# Patient Record
Sex: Female | Born: 1995 | Race: White | Hispanic: No | Marital: Married | Smoking: Former smoker
Health system: Southern US, Community
[De-identification: ages and names within clinical notes are randomized; demographics above are authoritative.]

## PROBLEM LIST (undated history)

## (undated) DIAGNOSIS — N200 Calculus of kidney: Secondary | ICD-10-CM

## (undated) DIAGNOSIS — E785 Hyperlipidemia, unspecified: Secondary | ICD-10-CM

## (undated) DIAGNOSIS — M25569 Pain in unspecified knee: Secondary | ICD-10-CM

## (undated) DIAGNOSIS — I10 Essential (primary) hypertension: Secondary | ICD-10-CM

## (undated) DIAGNOSIS — M549 Dorsalgia, unspecified: Secondary | ICD-10-CM

## (undated) DIAGNOSIS — M25473 Effusion, unspecified ankle: Secondary | ICD-10-CM

## (undated) DIAGNOSIS — Z87442 Personal history of urinary calculi: Secondary | ICD-10-CM

## (undated) DIAGNOSIS — K5900 Constipation, unspecified: Secondary | ICD-10-CM

## (undated) HISTORY — DX: Calculus of kidney: N20.0

## (undated) HISTORY — DX: Pain in unspecified knee: M25.569

## (undated) HISTORY — DX: Dorsalgia, unspecified: M54.9

## (undated) HISTORY — PX: FOOT SURGERY: SHX648

## (undated) HISTORY — DX: Constipation, unspecified: K59.00

## (undated) HISTORY — DX: Hyperlipidemia, unspecified: E78.5

## (undated) HISTORY — PX: CHOLECYSTECTOMY: SHX55

## (undated) HISTORY — DX: Effusion, unspecified ankle: M25.473

## (undated) HISTORY — PX: EYE MUSCLE SURGERY: SHX370

---

## 2020-06-17 ENCOUNTER — Other Ambulatory Visit: Payer: Self-pay

## 2020-06-17 ENCOUNTER — Emergency Department (HOSPITAL_COMMUNITY)
Admission: EM | Admit: 2020-06-17 | Discharge: 2020-06-17 | Disposition: A | Discharge status: Home / Self Care | Payer: Commercial Managed Care - HMO | Attending: Emergency Medicine | Admitting: Emergency Medicine

## 2020-06-17 ENCOUNTER — Emergency Department (HOSPITAL_COMMUNITY): Payer: Commercial Managed Care - HMO

## 2020-06-17 ENCOUNTER — Encounter (HOSPITAL_COMMUNITY): Payer: Self-pay | Admitting: Emergency Medicine

## 2020-06-17 DIAGNOSIS — F17210 Nicotine dependence, cigarettes, uncomplicated: Secondary | ICD-10-CM | POA: Insufficient documentation

## 2020-06-17 DIAGNOSIS — M25571 Pain in right ankle and joints of right foot: Secondary | ICD-10-CM | POA: Diagnosis present

## 2020-06-17 DIAGNOSIS — S82831A Other fracture of upper and lower end of right fibula, initial encounter for closed fracture: Secondary | ICD-10-CM | POA: Diagnosis not present

## 2020-06-17 DIAGNOSIS — W502XXA Accidental twist by another person, initial encounter: Secondary | ICD-10-CM | POA: Diagnosis not present

## 2020-06-17 MED ORDER — HYDROCODONE-ACETAMINOPHEN 5-325 MG PO TABS: 1.0000 | ORAL_TABLET | Freq: Four times a day (QID) | ORAL | 0 refills | Status: DC | PRN

## 2020-06-17 NOTE — ED Provider Notes (Signed)
[facility] EMERGENCY DEPARTMENT Provider Note   CSN: [id number] Arrival date & time: 06/17/20  5427     History Chief Complaint  Patient presents with  . Ankle Pain    Stefanie Farmer is a 25 y.o. female.  HPI Patient presents with right ankle injury. Happened last night while she was "play fighting" with her sister. States her foot twisted back under her. Planing of pain in the ankle particularly on the medial side. States it does go up the leg a little bit. No other injury. States her sister told her it could be broken because there is not a lot of bruising.    History reviewed. No pertinent past medical history.  There are no problems to display for this patient.   Past Surgical History:  Procedure Laterality Date  . FOOT SURGERY       OB History   No obstetric history on file.     No family history on file.  Social History   Tobacco Use  . Smoking status: Current Every Day Smoker  . Smokeless tobacco: Never Used  Vaping Use  . Vaping Use: Some days  Substance Use Topics  . Alcohol use: Yes  . Drug use: Yes    Types: Marijuana    Home Medications Prior to Admission medications   Medication Sig Start Date End Date Taking? Authorizing Provider  HYDROcodone-acetaminophen (NORCO/VICODIN) 5-325 MG tablet Take 1-2 tablets by mouth every 6 (six) hours as needed. 06/17/20  Yes Benjiman Core, MD    Allergies    Patient has no allergy information on record.  Review of Systems   Review of Systems  Constitutional: Negative for appetite change.  HENT: Negative for dental problem.   Cardiovascular: Negative for chest pain.  Gastrointestinal: Negative for abdominal pain.  Musculoskeletal: Negative for back pain and neck pain.       Right ankle pain and swelling.  Skin: Negative for wound.  Neurological: Negative for weakness and numbness.    Physical Exam Updated Vital Signs BP (!) 108/50   Pulse 66   Temp 98.3 F (36.8 C)   Resp 18   Ht 5\' 2"   (1.575 m)   Wt 115.7 kg   LMP 06/03/2020   SpO2 100%   BMI 46.64 kg/m   Physical Exam Vitals and nursing note reviewed.  HENT:     Head: Atraumatic.  Cardiovascular:     Rate and Rhythm: Regular rhythm.  Chest:     Chest wall: No tenderness.  Abdominal:     Tenderness: There is no abdominal tenderness.  Musculoskeletal:     Cervical back: Neck supple.     Comments: Some tenderness and swelling right ankle. Some also swelling of foot. No real bruising. Skin intact. No tenderness over proximal fibula.  Skin:    General: Skin is warm.     Capillary Refill: Capillary refill takes less than 2 seconds.  Neurological:     Mental Status: She is alert and oriented to person, place, and time.     ED Results / Procedures / Treatments   Labs (all labs ordered are listed, but only abnormal results are displayed) Labs Reviewed - No data to display  EKG None  Radiology DG Ankle Complete Right  Result Date: 06/17/2020 CLINICAL DATA:  Pain and swelling. EXAM: RIGHT ANKLE - COMPLETE 3+ VIEW COMPARISON:  No prior. FINDINGS: Prominent diffuse soft tissue swelling. Slightly displaced fracture of the distal right fibula noted. Medial malleolus is intact. Tiny fracture fragment along  the posterior malleolus cannot be excluded. Tiny bony density noted adjacent to the right lateral foot, this may represent a tiny secondary ossification center or tiny avulsion fracture fragment. Lateral subluxation of the talus noted. IMPRESSION: 1. Prominent diffuse soft tissue swelling. Slightly displaced fracture of the distal right fibula. Medial malleolus intact. Tiny fracture fragment along the posterior malleolus cannot be excluded. Tiny bony density noted adjacent to the right lateral foot, this may represent a tiny secondary ossification center or tiny avulsion fracture fragment. 2.  Lateral subluxation of the talus. Electronically Signed   By: Maisie Fus  Register   On: 06/17/2020 06:35     Procedures Procedures (including critical care time)  Medications Ordered in ED Medications - No data to display  ED Course  I have reviewed the triage vital signs and the nursing notes.  Pertinent labs & imaging results that were available during my care of the patient were reviewed by me and considered in my medical decision making (see chart for details).    MDM Rules/Calculators/A&P                          Patient with distal fibular fracture. Also widening of mortise. Will immobilize with posterior and stirrup's. Nonweightbearing. Follow-up with orthopedic surgery. No other apparent injury. Discharge home. I have reviewed the images myself. Final Clinical Impression(s) / ED Diagnoses Final diagnoses:  Closed fracture of distal end of right fibula, unspecified fracture morphology, initial encounter    Rx / DC Orders ED Discharge Orders         Ordered    HYDROcodone-acetaminophen (NORCO/VICODIN) 5-325 MG tablet  Every 6 hours PRN        06/17/20 0732           Benjiman Core, MD 06/17/20 [phone]

## 2020-06-17 NOTE — ED Triage Notes (Addendum)
Pt c/o right lower leg/ankle pain after horse playing with sister last night.

## 2020-06-19 ENCOUNTER — Ambulatory Visit: Payer: Commercial Managed Care - HMO | Admitting: Orthopedic Surgery

## 2020-06-19 ENCOUNTER — Other Ambulatory Visit: Payer: Self-pay

## 2020-06-19 ENCOUNTER — Encounter: Payer: Self-pay | Admitting: Orthopedic Surgery

## 2020-06-19 ENCOUNTER — Ambulatory Visit: Payer: Commercial Managed Care - HMO

## 2020-06-19 VITALS — BP 133/75 | HR 88 | Ht 62.0 in | Wt 255.0 lb

## 2020-06-19 DIAGNOSIS — S7001XA Contusion of right hip, initial encounter: Secondary | ICD-10-CM

## 2020-06-19 DIAGNOSIS — S8261XA Displaced fracture of lateral malleolus of right fibula, initial encounter for closed fracture: Secondary | ICD-10-CM | POA: Diagnosis not present

## 2020-06-19 DIAGNOSIS — M25551 Pain in right hip: Secondary | ICD-10-CM

## 2020-06-19 MED ORDER — HYDROCODONE-ACETAMINOPHEN 5-325 MG PO TABS: 1.0000 | ORAL_TABLET | Freq: Four times a day (QID) | ORAL | 0 refills | Status: DC | PRN

## 2020-06-19 MED ORDER — IBUPROFEN 800 MG PO TABS: 800.0000 mg | ORAL_TABLET | Freq: Three times a day (TID) | ORAL | 1 refills | Status: DC | PRN

## 2020-06-19 NOTE — Patient Instructions (Addendum)
Keep the foot elevated  Apply ice as needed to control swelling  If you feel throbbing pain take 800 mg of ibuprofen  Continue your hydrocodone one every 6 hours as needed   Displaced Fibular Ankle Fracture Treated With ORIF A fibular ankle fracture is a break (fracture) in the part of the fibula that is located near the ankle. The fibula is the smaller of the two bones in the lower leg and is on the outer side of the leg. A displaced fracture means that when your bone broke, the pieces shifted and are not lined up correctly. Your ankle joint can also become unstable, which means the bones move more than normal. The bones will be put back into position and held in place with a procedure called open reduction with internal fixation (ORIF). A combination of screws, screws and a metal plate, or different types of wiring will be used. The procedure helps the bones and the tissues that connect bones to each other (ligaments) heal properly. Tell a health care provider about:  Any allergies you have.  All medicines you are taking, including vitamins, herbs, eye drops, creams, and over-the-counter medicines.  Any problems you or family members have had with anesthetic medicines.  Any blood disorders you have.  Any surgeries you have had.  Any medical conditions you have, such as sleep apnea or heart disease.  Whether you are pregnant or may be pregnant. What are the risks? Generally, this is a safe procedure. However, problems may occur.  Problems that may occur shortly after surgery include: ? Bleeding or blood clots. ? Allergic reactions to medicines. ? Damage to nearby structures such as nerves or blood vessels. ? Infection.  Long-term (chronic) problems that may occur after surgery include: ? Chronic pain from the screws or plates. ? Arthritis. This is chronic joint pain or joint disease. ? Failure to heal properly. ? Chronic pain and loss of range of motion that make it difficult  for you to walk or do some activities. What happens before the procedure? Staying hydrated Follow instructions from your health care provider about hydration, which may include:  Up to 2 hours before the procedure - you may continue to drink clear liquids, such as water, clear fruit juice, black coffee, and plain tea.   Eating and drinking restrictions Follow instructions from your health care provider about eating and drinking, which may include:  8 hours before the procedure - stop eating heavy meals or foods, such as meat, fried foods, or fatty foods.  6 hours before the procedure - stop eating light meals or foods, such as toast or cereal.  6 hours before the procedure - stop drinking milk or drinks that contain milk.  2 hours before the procedure - stop drinking clear liquids. Medicines  Ask your health care provider about: ? Changing or stopping your regular medicines. This is especially important if you are taking diabetes medicines or blood thinners. ? Taking medicines such as aspirin and ibuprofen. These medicines can thin your blood. Do not take these medicines unless your health care provider tells you to take them. ? Taking over-the-counter medicines, vitamins, herbs, and supplements.  You may be given antibiotic medicine to help prevent infection. General instructions  Do not use any products that contain nicotine or tobacco for at least 4 weeks before the procedure. These products include cigarettes, e-cigarettes, and chewing tobacco. If you need help quitting, ask your health care provider.  Plan to have someone take you home  from the hospital or clinic.  Plan to have a responsible adult care for you for at least 24 hours after you leave the hospital or clinic. This is important.  Ask your health care provider: ? How your surgery site will be marked. ? What steps will be taken to help prevent infection. These steps may include:  Removing hair at the surgery  site.  Washing skin with a germ-killing soap.  Taking antibiotic medicine. What happens during the procedure?  An IV will be inserted into one of your veins.  You will be given one or more of the following: ? A medicine to help you relax (sedative). ? A medicine to numb the area (local anesthetic). ? A medicine to make you fall asleep (general anesthetic). ? A medicine that is injected into an area of your body to numb everything below the injection site (regional anesthetic).  An incision will be made on the outside of your ankle to expose the bone.  The broken bone will be returned to its normal position. To hold the bone in place, the surgeon will use hardware such as screws and a metal plate or different types of wiring.  The surgeon will close the incision with stitches (sutures) or staples.  A bandage (dressing) will be placed over your incision.  A padded splint will be placed to help protect your ankle and keep it still. The procedure may vary among health care providers and hospitals.   What happens after the procedure?  Your blood pressure, heart rate, breathing rate, and blood oxygen level will be monitored until you leave the hospital or clinic.  You will be given pain medicine as needed.  You may have an X-ray done to make sure the bones are in the right position.  You will be not be able to support your body weight with (not be able to put weight on) your injured side for several weeks. You will be given crutches, a walker, or a scooter.  If you were given a sedative during the procedure, it can affect you for several hours. Do not drive or operate machinery until your health care provider says that it is safe. Summary  A fibular ankle fracture is a break in the part of the bone called the fibula that is located near your ankle. Displaced means the pieces of bone shifted and are not lined up correctly.  Open reduction with internal fixation (ORIF) is the  procedure to put your bones back in position and hold them in place.  Follow instructions from your health care provider about eating, drinking, and taking medicines before your procedure. This information is not intended to replace advice given to you by your health care provider. Make sure you discuss any questions you have with your health care provider. Document Revised: 09/03/2019 Document Reviewed: 09/03/2019 Elsevier Patient Education  2021 [employer].

## 2020-06-19 NOTE — Progress Notes (Signed)
EMERGENCY ROOM FOLLOW UP  NEW PROBLEM/PATIENT   Patient ID: Stefanie Farmer, female   DOB: 1995/06/07, 25 y.o.   MRN: [id number]   ASSESSMENT AND PLAN:  25 year old female had a right ankle injury on January 17 play fighting with her sister. Twisted her ankle fell x-ray showed a lateral malleolus fracture with widening ankle mortise despite splinting she continues to complain of pain and due to the ankle mortise widening she will need ORIF of the right ankle  The procedure has been fully reviewed with the patient; The risks and benefits of surgery have been discussed and explained and understood. Alternative treatment has also been reviewed, questions were encouraged and answered. The postoperative plan is also been reviewed.   Emergency room record from (date) June 17, 2020 has been reviewed and this is included by reference and includes the review of systems with the following addition:   Chief Complaint  Patient presents with  . Ankle Injury    06/17/20 right ankle injury   . Hip Pain    Right    HPI Stefanie Farmer is a 25 y.o. female. Evaluation of the right ankle 25 year old female injured her right ankle complains of pain medial lateral side has swelling medially and laterally ankle mortise shows widening     Review of Systems Review of Systems  Neurological: Negative for numbness.  All other systems reviewed and are negative.    has no past medical history on file.   Past Surgical History:  Procedure Laterality Date  . FOOT SURGERY      History reviewed. No pertinent family history.  Social History Social History   Tobacco Use  . Smoking status: Current Every Day Smoker  . Smokeless tobacco: Never Used  Vaping Use  . Vaping Use: Some days  Substance Use Topics  . Alcohol use: Yes  . Drug use: Yes    Types: Marijuana    Not on File  Current Outpatient Medications  Medication Sig Dispense Refill  . HYDROcodone-acetaminophen (NORCO/VICODIN) 5-325 MG  tablet Take 1 tablet by mouth every 6 (six) hours as needed for moderate pain. 30 tablet 0  . ibuprofen (ADVIL) 800 MG tablet Take 1 tablet (800 mg total) by mouth every 8 (eight) hours as needed. 90 tablet 1   No current facility-administered medications for this visit.    Physical Exam BP 133/75   Pulse 88   Ht 5\' 2"  (1.575 m)   Wt 255 lb (115.7 kg)   LMP 06/03/2020   BMI 46.64 kg/m  Body mass index is 46.64 kg/m.  Well developed and well nourished  Unable to weight-bear alert and oriented x 3  Normal affect and mood   GAIT:  As above  Right hip tenderness normal range of motion no instability noted   (STRIMVS) Right ankle is tender laterally over the fibula medially over the medial collateral ligament where there is ecchymosis and swelling no gross deformity is seen color and capillary refill are normal motor function is intact  Imaging indicates unstable fracture pattern  Data Reviewed IMAGING From THE ER AND THE REPORT ARE REVIEWED, MY INTERPRETATION OF THE IMAGE(S) IS : Oblique lateral malleolus fracture Weber type B supination external rotation injury with widening of ankle mortise  X-ray was taken to evaluate the right hip pain  Normal AP lateral and pelvic view right hip   Assessment  Encounter Diagnoses  Name Primary?  . Acute right hip pain Yes  . Closed displaced fracture of lateral malleolus of right  fibula, initial encounter   . Contusion of right hip, initial encounter      Plan   ORIF right ankle lateral plate   Stefanie Canada, MD 06/19/2020 9:55 AM

## 2020-06-20 NOTE — Patient Instructions (Signed)
Rolly SalterHaley Knab  06/20/2020     @PREFPERIOPPHARMACY @   Your procedure is scheduled on  06/24/2020 .  [facility] to [facility] at  0915  A.M.   Call this number if you have problems the morning of surgery:  [phone]   Remember:  Do not eat or drink after midnight.                         Take these medicines the morning of surgery with A SIP OF WATER  Hydrocodone(if needed).   Please brush your teeth.  Do not wear jewelry, make-up or nail polish.  Do not wear lotions, powders, or perfumes, or deodorant.  Do not shave 48 hours prior to surgery.  Men may shave face and neck.  Do not bring valuables to the hospital.  [facility] is not responsible for any belongings or valuables.  Contacts, dentures or bridgework may not be worn into surgery.  Leave your suitcase in the car.  After surgery it may be brought to your room.  For patients admitted to the hospital, discharge time will be determined by your treatment team.  Patients discharged the day of surgery will not be allowed to drive home and they will need someone with them for 24 hours.   Special instructions:  DO NOT smoke (tobacco or vape) the morning of your procedure.   Use the CHG wipes to clean your skin the night before and the morning of your procedure. DO NOT use the CHG on your face, hair or genitals(privatre parts) or on any open wounds or sores. There are 2 cloths in each package of chg. Use 1 wipe to clean the exposed skin on your right leg. DO NOT remove any bandages that are there. Use the other wipe to clean your arms, neck, torso and the left leg.     The morning of your surgery, repeat the above process before you come to the hospital with the second pack of wipes.   After you clean with the cloths, your skin may feel sticky, let your skin air dry before dressing. Make sure you out on clean clothes to sleep in and clean, comfortable clothes before you come to the hospital.     Place clean sheets  on your bed before you sleep the night before your surgery.   Please read over the following fact sheets that you were given. Anesthesia Post-op Instructions and Care and Recovery After Surgery       Displaced Bimalleolar Ankle Fracture Treated With ORIF, Care After This sheet gives you information about how to care for yourself after your procedure. Your health care provider may also give you more specific instructions. If you have problems or questions, contact your health care provider. What can I expect after the procedure? After the procedure, it is common to have:  Pain.  Swelling.  A small amount of fluid from your incision. Follow these instructions at home: Medicines  Take over-the-counter and prescription medicines only as told by your health care provider.  Ask your health care provider if the medicine prescribed to you: ? Requires you to avoid driving or using machinery. ? Can cause constipation. You may need to take these actions to prevent or treat constipation:  Drink enough fluid to keep your urine pale yellow.  Take over-the-counter or prescription medicines.  Eat foods that are high in fiber, such as beans, whole grains, and fresh fruits and  vegetables.  Limit foods that are high in fat and processed sugars, such as fried or sweet foods. If you have a splint or boot:  Wear the splint or boot as told by your health care provider. Remove it only as told by your health care provider.  Loosen the splint or boot if your toes tingle, become numb, or turn cold and blue.  Keep the splint or boot clean. If you have a cast:  Do not stick anything inside the cast to scratch your skin. Doing that increases your risk of infection.  Check the skin around the cast every day. Tell your health care provider about any concerns.  You may put lotion on dry skin around the edges of the cast. Do not put lotion on the skin underneath the cast.  Keep the cast  clean. Bathing  Do not take baths, swim, or use a hot tub until your health care provider approves. Ask your health care provider if you may take showers. You may only be allowed to take sponge baths.  If your splint, boot, or cast is not waterproof: ? Do not let it get wet. ? Cover it with a watertight covering when you take a bath or a shower.  Keep the bandage (dressing) dry until your health care provider says it can be removed. Incision care  Follow instructions from your health care provider about how to take care of your incision. Make sure you: ? Wash your hands with soap and water for at least 20 seconds before and after you change your dressing. If soap and water are not available, use hand sanitizer. ? Change your dressing as told by your health care provider. ? Leave stitches (sutures), skin glue, or adhesive strips in place. These skin closures may need to stay in place for 2 weeks or longer. If adhesive strip edges start to loosen and curl up, you may trim the loose edges. Do not remove adhesive strips completely unless your health care provider tells you to do that.  Check your incision area every day for signs of infection. Check for: ? Redness. ? More pain or more swelling. ? Blood or more fluid. ? Warmth. ? Pus or a bad smell.   Managing pain, stiffness, and swelling  If directed, put ice on the affected area. To do this: ? If you have a removable splint or boot, remove it as told by your health care provider. ? Put ice in a plastic bag. ? Place a towel between your skin and the bag, or between your cast and the bag. ? Leave the ice on for 20 minutes, 2-3 times a day. ? Remove the ice if your skin turns bright red. This is very important. If you cannot feel pain, heat, or cold, you have a greater risk of damage to the area.  Move your toes often to reduce stiffness and swelling.  Raise (elevate) the injured area above the level of your heart while you are sitting  or lying down. To do this, try putting a few pillows under your leg and ankle.   Activity  Do not use your injured limb to support (bear) your body weight until your health care provider says that you can. Follow weight-bearing restrictions as told. Use crutches or other assistive devicesto help you move around as told by your health care provider.  Ask your health care provider when it is safe to drive if you have a splint, boot, or cast on your foot.  Do exercises as told by your health care provider or physical therapist.  Return to your normal activities as told by your health care provider. Ask your health care provider what activities are safe for you. General instructions  Do not put pressure on any part of the splint or cast until it is fully hardened, if applicable. This may take several hours.  Do not use any products that contain nicotine or tobacco, such as cigarettes, e-cigarettes, and chewing tobacco. These can delay bone healing. If you need help quitting, ask your health care provider.  Keep all follow-up visits. This is important. Contact a health care provider if:  You have a fever.  Your pain medicine is not helping.  You have redness around your incision.  You have more swelling or severe pain around your incision.  You have more fluid or blood coming from your incision or leaking through your cast.  Your incision feels warm to the touch.  You have pus or a bad smell coming from your incision or from your cast or dressing. Get help right away if:  The edges of your incision come apart after the stitches or staples have been taken out.  You have chest pain.  You have trouble breathing.  Your foot or leg feels numb or tingles.  Your foot becomes cold, pale, or blue.  You have calf swelling or tenderness. These symptoms may represent a serious problem that is an emergency. Do not wait to see if the symptoms will go away. Get medical help right away. Call  your local emergency services (911 in the [place] Do not drive yourself to the hospital. Summary  After the procedure, it is common to have pain and swelling.  If your splint, boot, or cast is not waterproof, do not let it get wet.  Contact your health care provider if you have more swelling or severe pain, or if you have more fluids coming from your incision or leaking through your cast.  Get help right away if you have numbness or tingling in your foot or leg, or if your foot becomes cold, pale, or blue. This information is not intended to replace advice given to you by your health care provider. Make sure you discuss any questions you have with your health care provider. Document Revised: 08/20/2019 Document Reviewed: 08/20/2019 Elsevier Patient Education  2021 [employer] General Anesthesia, Adult, Care After This sheet gives you information about how to care for yourself after your procedure. Your health care provider may also give you more specific instructions. If you have problems or questions, contact your health care provider. What can I expect after the procedure? After the procedure, the following side effects are common:  Pain or discomfort at the IV site.  Nausea.  Vomiting.  Sore throat.  Trouble concentrating.  Feeling cold or chills.  Feeling weak or tired.  Sleepiness and fatigue.  Soreness and body aches. These side effects can affect parts of the body that were not involved in surgery. Follow these instructions at home: For the time period you were told by your health care provider:  Rest.  Do not participate in activities where you could fall or become injured.  Do not drive or use machinery.  Do not drink alcohol.  Do not take sleeping pills or medicines that cause drowsiness.  Do not make important decisions or sign legal documents.  Do not take care of children on your own.   Eating and drinking  Follow any instructions  from your health  care provider about eating or drinking restrictions.  When you feel hungry, start by eating small amounts of foods that are soft and easy to digest (bland), such as toast. Gradually return to your regular diet.  Drink enough fluid to keep your urine pale yellow.  If you vomit, rehydrate by drinking water, juice, or clear broth. General instructions  If you have sleep apnea, surgery and certain medicines can increase your risk for breathing problems. Follow instructions from your health care provider about wearing your sleep device: ? Anytime you are sleeping, including during daytime naps. ? While taking prescription pain medicines, sleeping medicines, or medicines that make you drowsy.  Have a responsible adult stay with you for the time you are told. It is important to have someone help care for you until you are awake and alert.  Return to your normal activities as told by your health care provider. Ask your health care provider what activities are safe for you.  Take over-the-counter and prescription medicines only as told by your health care provider.  If you smoke, do not smoke without supervision.  Keep all follow-up visits as told by your health care provider. This is important. Contact a health care provider if:  You have nausea or vomiting that does not get better with medicine.  You cannot eat or drink without vomiting.  You have pain that does not get better with medicine.  You are unable to pass urine.  You develop a skin rash.  You have a fever.  You have redness around your IV site that gets worse. Get help right away if:  You have difficulty breathing.  You have chest pain.  You have blood in your urine or stool, or you vomit blood. Summary  After the procedure, it is common to have a sore throat or nausea. It is also common to feel tired.  Have a responsible adult stay with you for the time you are told. It is important to have someone help care for  you until you are awake and alert.  When you feel hungry, start by eating small amounts of foods that are soft and easy to digest (bland), such as toast. Gradually return to your regular diet.  Drink enough fluid to keep your urine pale yellow.  Return to your normal activities as told by your health care provider. Ask your health care provider what activities are safe for you. This information is not intended to replace advice given to you by your health care provider. Make sure you discuss any questions you have with your health care provider. Document Revised: 01/31/2020 Document Reviewed: 08/30/2019 Elsevier Patient Education  2021 [employer].

## 2020-06-23 ENCOUNTER — Other Ambulatory Visit (HOSPITAL_COMMUNITY)
Admission: RE | Admit: 2020-06-23 | Discharge: 2020-06-23 | Disposition: A | Discharge status: Home / Self Care | Payer: Commercial Managed Care - HMO | Source: Ambulatory Visit | Attending: Orthopedic Surgery | Admitting: Orthopedic Surgery

## 2020-06-23 ENCOUNTER — Telehealth: Payer: Self-pay | Admitting: Orthopedic Surgery

## 2020-06-23 ENCOUNTER — Other Ambulatory Visit: Payer: Self-pay

## 2020-06-23 ENCOUNTER — Encounter (HOSPITAL_COMMUNITY)
Admission: RE | Admit: 2020-06-23 | Discharge: 2020-06-23 | Disposition: A | Discharge status: Home / Self Care | Payer: Commercial Managed Care - HMO | Source: Ambulatory Visit | Attending: Orthopedic Surgery | Admitting: Orthopedic Surgery

## 2020-06-23 ENCOUNTER — Encounter (HOSPITAL_COMMUNITY): Payer: Self-pay

## 2020-06-23 DIAGNOSIS — Z01812 Encounter for preprocedural laboratory examination: Secondary | ICD-10-CM | POA: Insufficient documentation

## 2020-06-23 DIAGNOSIS — Z20822 Contact with and (suspected) exposure to covid-19: Secondary | ICD-10-CM | POA: Insufficient documentation

## 2020-06-23 LAB — BASIC METABOLIC PANEL
Anion gap: 7 (ref 5–15)
BUN: 11 mg/dL (ref 6–20)
CO2: 27 mmol/L (ref 22–32)
Calcium: 8.9 mg/dL (ref 8.9–10.3)
Chloride: 103 mmol/L (ref 98–111)
Creatinine, Ser: 0.66 mg/dL (ref 0.44–1.00)
GFR, Estimated: >60 mL/min (ref >60)
Glucose, Bld: 88 mg/dL (ref 70–99)
Potassium: 3.7 mmol/L (ref 3.5–5.1)
Sodium: 137 mmol/L (ref 135–145)

## 2020-06-23 LAB — CBC WITH DIFFERENTIAL/PLATELET
Abs Immature Granulocytes: 0.03 10*3/uL (ref 0.00–0.07)
Basophils Absolute: 0 10*3/uL (ref 0.0–0.1)
Basophils Relative: 0 %
Eosinophils Absolute: 0.3 10*3/uL (ref 0.0–0.5)
Eosinophils Relative: 3 %
HCT: 41.3 % (ref 36.0–46.0)
Hemoglobin: 13.1 g/dL (ref 12.0–15.0)
Immature Granulocytes: 0 %
Lymphocytes Relative: 29 %
Lymphs Abs: 2.7 10*3/uL (ref 0.7–4.0)
MCH: 29.6 pg (ref 26.0–34.0)
MCHC: 31.7 g/dL (ref 30.0–36.0)
MCV: 93.2 fL (ref 80.0–100.0)
Monocytes Absolute: 0.6 10*3/uL (ref 0.1–1.0)
Monocytes Relative: 7 %
Neutro Abs: 5.4 10*3/uL (ref 1.7–7.7)
Neutrophils Relative %: 61 %
Platelets: 374 10*3/uL (ref 150–400)
RBC: 4.43 MIL/uL (ref 3.87–5.11)
RDW: 13.6 % (ref 11.5–15.5)
WBC: 9.1 10*3/uL (ref 4.0–10.5)
nRBC: 0 % (ref 0.0–0.2)

## 2020-06-23 LAB — HCG, SERUM, QUALITATIVE: Preg, Serum: NEGATIVE

## 2020-06-23 NOTE — Telephone Encounter (Signed)
This patient called and had a fall yesterday on the other hip and is having some more swelling in the ankle that is supposed to be having surgery on 06/24/2020. She was outside walking and lost her balance. She did not complain of any other pain in other areas.  Do you need to evaluate her before the surgery? She still has the splint that was placed on her in the clinic at the last visit last week. She feels like a throbbing pain. Please advise.

## 2020-06-23 NOTE — Telephone Encounter (Signed)
I called patient and she voices understanding. No other concerns.

## 2020-06-23 NOTE — Telephone Encounter (Signed)
No   We l see her tomorrow

## 2020-06-23 NOTE — H&P (Signed)
Outpatient history and physical for right ankle surgery    Patient ID: Stefanie Farmer, female   DOB: [birth year], 25 y.o.   MRN: [id number]     ASSESSMENT AND PLAN:  25 year old female had a right ankle injury on January 17 play fighting with her sister. Twisted her ankle fell x-ray showed a lateral malleolus fracture with widening ankle mortise despite splinting she continues to complain of pain and due to the ankle mortise widening she will need ORIF of the right ankle   The procedure has been fully reviewed with the patient; The risks and benefits of surgery have been discussed and explained and understood. Alternative treatment has also been reviewed, questions were encouraged and answered. The postoperative plan is also been reviewed.     Emergency room record from (date) June 17, 2020 has been reviewed and this is included by reference and includes the review of systems with the following addition:        Chief Complaint  Patient presents with  . Ankle Injury      06/17/20 right ankle injury   . Hip Pain      Right      HPI Stefanie Farmer is a 25 y.o. female. Evaluation of the right ankle 25 year old female injured her right ankle complains of pain medial lateral side has swelling medially and laterally ankle mortise shows widening         Review of Systems Review of Systems  Neurological: Negative for numbness.  All other systems reviewed and are negative.      has no past medical history on file.         Past Surgical History:  Procedure Laterality Date  . FOOT SURGERY          History reviewed. No pertinent family history.   Social History Social History         Tobacco Use  . Smoking status: Current Every Day Smoker  . Smokeless tobacco: Never Used  Vaping Use  . Vaping Use: Some days  Substance Use Topics  . Alcohol use: Yes  . Drug use: Yes      Types: Marijuana      Not on File         Current Outpatient Medications  Medication Sig  Dispense Refill  . HYDROcodone-acetaminophen (NORCO/VICODIN) 5-325 MG tablet Take 1 tablet by mouth every 6 (six) hours as needed for moderate pain. 30 tablet 0  . ibuprofen (ADVIL) 800 MG tablet Take 1 tablet (800 mg total) by mouth every 8 (eight) hours as needed. 90 tablet 1    No current facility-administered medications for this visit.      Physical Exam BP 133/75   Pulse 88   Ht 5\' 2"  (1.575 m)   Wt 255 lb (115.7 kg)   LMP 06/03/2020   BMI 46.64 kg/m  Body mass index is 46.64 kg/m.   Well developed and well nourished  Unable to weight-bear alert and oriented x 3  Normal affect and mood     GAIT:   As above   Right hip tenderness normal range of motion no instability noted     (STRIMVS) Right ankle is tender laterally over the fibula medially over the medial collateral ligament where there is ecchymosis and swelling no gross deformity is seen color and capillary refill are normal motor function is intact   Imaging indicates unstable fracture pattern   Data Reviewed IMAGING From THE ER AND THE REPORT ARE REVIEWED, MY INTERPRETATION OF THE  IMAGE(S) IS : Oblique lateral malleolus fracture Weber type B supination external rotation injury with widening of ankle mortise   X-ray was taken to evaluate the right hip pain   Normal AP lateral and pelvic view right hip     Assessment       Encounter Diagnoses  Name Primary?  . Acute right hip pain Yes  . Closed displaced fracture of lateral malleolus of right fibula, initial encounter    . Contusion of right hip, initial encounter          Plan     ORIF right ankle lateral plate

## 2020-06-24 ENCOUNTER — Ambulatory Visit (HOSPITAL_COMMUNITY): Payer: Commercial Managed Care - HMO

## 2020-06-24 ENCOUNTER — Ambulatory Visit (HOSPITAL_COMMUNITY): Payer: Commercial Managed Care - HMO | Admitting: Certified Registered"

## 2020-06-24 ENCOUNTER — Encounter (HOSPITAL_COMMUNITY)
Admission: RE | Disposition: A | Discharge status: Home / Self Care | Payer: Self-pay | Source: Home / Self Care | Attending: Orthopedic Surgery

## 2020-06-24 ENCOUNTER — Other Ambulatory Visit: Payer: Self-pay

## 2020-06-24 ENCOUNTER — Encounter (HOSPITAL_COMMUNITY): Payer: Self-pay | Admitting: Orthopedic Surgery

## 2020-06-24 ENCOUNTER — Ambulatory Visit (HOSPITAL_COMMUNITY)
Admission: RE | Admit: 2020-06-24 | Discharge: 2020-06-24 | Disposition: A | Discharge status: Home / Self Care | Payer: Commercial Managed Care - HMO | Attending: Orthopedic Surgery | Admitting: Orthopedic Surgery

## 2020-06-24 DIAGNOSIS — X501XXA Overexertion from prolonged static or awkward postures, initial encounter: Secondary | ICD-10-CM | POA: Diagnosis not present

## 2020-06-24 DIAGNOSIS — F17200 Nicotine dependence, unspecified, uncomplicated: Secondary | ICD-10-CM | POA: Insufficient documentation

## 2020-06-24 DIAGNOSIS — S8263XD Displaced fracture of lateral malleolus of unspecified fibula, subsequent encounter for closed fracture with routine healing: Secondary | ICD-10-CM

## 2020-06-24 DIAGNOSIS — S82891A Other fracture of right lower leg, initial encounter for closed fracture: Secondary | ICD-10-CM

## 2020-06-24 DIAGNOSIS — S8261XA Displaced fracture of lateral malleolus of right fibula, initial encounter for closed fracture: Secondary | ICD-10-CM

## 2020-06-24 HISTORY — PX: ORIF ANKLE FRACTURE: SHX5408

## 2020-06-24 LAB — SARS CORONAVIRUS 2 (TAT 6-24 HRS): SARS Coronavirus 2: NEGATIVE

## 2020-06-24 SURGERY — OPEN REDUCTION INTERNAL FIXATION (ORIF) ANKLE FRACTURE
Anesthesia: Regional | Site: Ankle | Laterality: Right

## 2020-06-24 MED ORDER — HYDROMORPHONE HCL 1 MG/ML IJ SOLN
INTRAMUSCULAR | Status: AC
  Filled 2020-06-24: qty 0.5

## 2020-06-24 MED ORDER — HYDROCODONE-ACETAMINOPHEN 5-325 MG PO TABS
1.0000 | ORAL_TABLET | Freq: Once | ORAL | Status: AC
Start: 2020-06-24 — End: 2020-06-24
  Administered 2020-06-24: 1 via ORAL
  Filled 2020-06-24: qty 1

## 2020-06-24 MED ORDER — ONDANSETRON HCL 4 MG/2ML IJ SOLN
INTRAMUSCULAR | Status: DC | PRN
  Administered 2020-06-24: 4 mg via INTRAVENOUS

## 2020-06-24 MED ORDER — GLYCOPYRROLATE PF 0.2 MG/ML IJ SOSY
PREFILLED_SYRINGE | INTRAMUSCULAR | Status: AC
  Filled 2020-06-24: qty 1

## 2020-06-24 MED ORDER — PROPOFOL 10 MG/ML IV BOLUS
INTRAVENOUS | Status: AC
  Filled 2020-06-24: qty 40

## 2020-06-24 MED ORDER — FENTANYL CITRATE (PF) 100 MCG/2ML IJ SOLN
INTRAMUSCULAR | Status: AC
  Filled 2020-06-24: qty 2

## 2020-06-24 MED ORDER — IBUPROFEN 800 MG PO TABS: 800.0000 mg | ORAL_TABLET | Freq: Once | ORAL | Status: DC

## 2020-06-24 MED ORDER — LIDOCAINE HCL (PF) 2 % IJ SOLN
INTRAMUSCULAR | Status: AC
  Filled 2020-06-24: qty 5

## 2020-06-24 MED ORDER — ONDANSETRON HCL 4 MG/2ML IJ SOLN
INTRAMUSCULAR | Status: AC
  Filled 2020-06-24: qty 2

## 2020-06-24 MED ORDER — KETAMINE HCL 10 MG/ML IJ SOLN
INTRAMUSCULAR | Status: DC | PRN
  Administered 2020-06-24: 20 mg via INTRAVENOUS

## 2020-06-24 MED ORDER — FENTANYL CITRATE (PF) 100 MCG/2ML IJ SOLN
25.0000 ug | INTRAMUSCULAR | Status: DC | PRN
  Administered 2020-06-24 (×2): 50 ug via INTRAVENOUS
  Filled 2020-06-24: qty 2

## 2020-06-24 MED ORDER — GLYCOPYRROLATE PF 0.2 MG/ML IJ SOSY
PREFILLED_SYRINGE | INTRAMUSCULAR | Status: DC | PRN
  Administered 2020-06-24: .1 mg via INTRAVENOUS

## 2020-06-24 MED ORDER — ROPIVACAINE HCL 5 MG/ML IJ SOLN
INTRAMUSCULAR | Status: DC | PRN
  Administered 2020-06-24 (×2): 15 mL via EPIDURAL

## 2020-06-24 MED ORDER — 0.9 % SODIUM CHLORIDE (POUR BTL) OPTIME
TOPICAL | Status: DC | PRN
  Administered 2020-06-24: 1000 mL

## 2020-06-24 MED ORDER — LACTATED RINGERS IV SOLN: INTRAVENOUS | Status: DC

## 2020-06-24 MED ORDER — LIDOCAINE HCL 1 % IJ SOLN
INTRAMUSCULAR | Status: DC | PRN
  Administered 2020-06-24: 5 mL via INTRADERMAL
  Administered 2020-06-24: 1 mL via INTRADERMAL
  Administered 2020-06-24: 5 mL via INTRADERMAL
  Administered 2020-06-24: 1 mL via INTRADERMAL

## 2020-06-24 MED ORDER — DEXMEDETOMIDINE (PRECEDEX) IN NS 20 MCG/5ML (4 MCG/ML) IV SYRINGE
PREFILLED_SYRINGE | INTRAVENOUS | Status: AC
  Filled 2020-06-24: qty 5

## 2020-06-24 MED ORDER — MIDAZOLAM HCL 2 MG/2ML IJ SOLN
0.5000 mg | Freq: Once | INTRAMUSCULAR | Status: AC | PRN
  Administered 2020-06-24: 2 mg via INTRAVENOUS
  Filled 2020-06-24: qty 2

## 2020-06-24 MED ORDER — ORAL CARE MOUTH RINSE: 15.0000 mL | Freq: Once | OROMUCOSAL | Status: DC

## 2020-06-24 MED ORDER — FENTANYL CITRATE (PF) 100 MCG/2ML IJ SOLN
INTRAMUSCULAR | Status: DC | PRN
  Administered 2020-06-24 (×5): 50 ug via INTRAVENOUS

## 2020-06-24 MED ORDER — DEXAMETHASONE SODIUM PHOSPHATE 4 MG/ML IJ SOLN
INTRAMUSCULAR | Status: DC | PRN
  Administered 2020-06-24: 4 mg via INTRAVENOUS

## 2020-06-24 MED ORDER — DEXMEDETOMIDINE (PRECEDEX) IN NS 20 MCG/5ML (4 MCG/ML) IV SYRINGE
PREFILLED_SYRINGE | INTRAVENOUS | Status: DC | PRN
  Administered 2020-06-24: 20 ug via INTRAVENOUS

## 2020-06-24 MED ORDER — HYDROCODONE-ACETAMINOPHEN 7.5-325 MG PO TABS: 1.0000 | ORAL_TABLET | ORAL | 0 refills | Status: DC | PRN

## 2020-06-24 MED ORDER — MIDAZOLAM HCL 5 MG/5ML IJ SOLN
INTRAMUSCULAR | Status: DC | PRN
  Administered 2020-06-24: 2 mg via INTRAVENOUS

## 2020-06-24 MED ORDER — KETOROLAC TROMETHAMINE 30 MG/ML IJ SOLN
INTRAMUSCULAR | Status: AC
  Filled 2020-06-24: qty 1

## 2020-06-24 MED ORDER — CHLORHEXIDINE GLUCONATE 0.12 % MT SOLN: 15.0000 mL | Freq: Once | OROMUCOSAL | Status: DC

## 2020-06-24 MED ORDER — IBUPROFEN 800 MG PO TABS: 800.0000 mg | ORAL_TABLET | Freq: Three times a day (TID) | ORAL | 0 refills | Status: DC | PRN

## 2020-06-24 MED ORDER — DEXAMETHASONE SODIUM PHOSPHATE 10 MG/ML IJ SOLN
INTRAMUSCULAR | Status: AC
  Filled 2020-06-24: qty 1

## 2020-06-24 MED ORDER — CEFAZOLIN SODIUM-DEXTROSE 2-4 GM/100ML-% IV SOLN
2.0000 g | INTRAVENOUS | Status: AC
  Administered 2020-06-24: 2 g via INTRAVENOUS

## 2020-06-24 MED ORDER — HYDROCODONE-ACETAMINOPHEN 5-325 MG PO TABS: 1.0000 | ORAL_TABLET | ORAL | 0 refills | Status: DC | PRN

## 2020-06-24 MED ORDER — BUPIVACAINE-EPINEPHRINE (PF) 0.5% -1:200000 IJ SOLN
INTRAMUSCULAR | Status: AC
  Filled 2020-06-24: qty 30

## 2020-06-24 MED ORDER — HYDROMORPHONE HCL 1 MG/ML IJ SOLN
0.2500 mg | INTRAMUSCULAR | Status: DC | PRN
Start: 2020-06-24 — End: 2020-06-25
  Administered 2020-06-24 (×3): 0.5 mg via INTRAVENOUS
  Filled 2020-06-24: qty 0.5

## 2020-06-24 MED ORDER — LIDOCAINE HCL (PF) 1 % IJ SOLN
INTRAMUSCULAR | Status: AC
  Filled 2020-06-24: qty 30

## 2020-06-24 MED ORDER — LIDOCAINE 2% (20 MG/ML) 5 ML SYRINGE
INTRAMUSCULAR | Status: DC | PRN
  Administered 2020-06-24: 100 mg via INTRAVENOUS

## 2020-06-24 MED ORDER — KETAMINE HCL 50 MG/5ML IJ SOSY
PREFILLED_SYRINGE | INTRAMUSCULAR | Status: AC
  Filled 2020-06-24: qty 5

## 2020-06-24 MED ORDER — ONDANSETRON HCL 4 MG/2ML IJ SOLN
4.0000 mg | Freq: Once | INTRAMUSCULAR | Status: AC | PRN
  Administered 2020-06-24: 4 mg via INTRAVENOUS
  Filled 2020-06-24: qty 2

## 2020-06-24 MED ORDER — HYDROMORPHONE HCL 1 MG/ML IJ SOLN: 0.2500 mg | INTRAMUSCULAR | Status: DC | PRN

## 2020-06-24 MED ORDER — CEFAZOLIN SODIUM-DEXTROSE 2-4 GM/100ML-% IV SOLN
INTRAVENOUS | Status: AC
  Filled 2020-06-24: qty 100

## 2020-06-24 MED ORDER — KETOROLAC TROMETHAMINE 30 MG/ML IJ SOLN
30.0000 mg | Freq: Once | INTRAMUSCULAR | Status: AC
  Administered 2020-06-24: 30 mg via INTRAVENOUS

## 2020-06-24 MED ORDER — PROPOFOL 10 MG/ML IV BOLUS
INTRAVENOUS | Status: DC | PRN
  Administered 2020-06-24: 200 mg via INTRAVENOUS

## 2020-06-24 MED ORDER — PHENYLEPHRINE 40 MCG/ML (10ML) SYRINGE FOR IV PUSH (FOR BLOOD PRESSURE SUPPORT)
PREFILLED_SYRINGE | INTRAVENOUS | Status: AC
  Filled 2020-06-24: qty 10

## 2020-06-24 MED ORDER — MIDAZOLAM HCL 2 MG/2ML IJ SOLN
INTRAMUSCULAR | Status: AC
  Filled 2020-06-24: qty 2

## 2020-06-24 SURGICAL SUPPLY — 68 items
APL PRP STRL LF DISP 70% ISPRP (MISCELLANEOUS) ×2
BANDAGE ELASTIC 4 VELCRO NS (GAUZE/BANDAGES/DRESSINGS) ×4 IMPLANT
BANDAGE ESMARK 4X12 BL STRL LF (DISPOSABLE) ×1 IMPLANT
BIT DRILL 3.5X122MM AO FIT (BIT) ×2 IMPLANT
BIT DRILL CANN 2.7 (BIT)
BIT DRILL SRG 2.7XCANN AO CPLG (BIT) IMPLANT
BIT DRL SRG 2.7XCANN AO CPLNG (BIT)
BLADE SURG SZ10 CARB STEEL (BLADE) ×2 IMPLANT
BNDG CMPR 12X4 ELC STRL LF (DISPOSABLE) ×1
BNDG CMPR STD VLCR NS LF 5.8X4 (GAUZE/BANDAGES/DRESSINGS) ×2
BNDG COHESIVE 4X5 TAN STRL (GAUZE/BANDAGES/DRESSINGS) ×2 IMPLANT
BNDG ELASTIC 4X5.8 VLCR NS LF (GAUZE/BANDAGES/DRESSINGS) ×4 IMPLANT
BNDG ESMARK 4X12 BLUE STRL LF (DISPOSABLE) ×2
CHLORAPREP W/TINT 26 (MISCELLANEOUS) ×4 IMPLANT
CLOTH BEACON ORANGE TIMEOUT ST (SAFETY) ×2 IMPLANT
COVER LIGHT HANDLE STERIS (MISCELLANEOUS) ×4 IMPLANT
COVER WAND RF STERILE (DRAPES) ×2 IMPLANT
CUFF TOURN SGL QUICK 42 (TOURNIQUET CUFF) ×2 IMPLANT
DRAPE C-ARM FOLDED MOBILE STRL (DRAPES) ×2 IMPLANT
DRILL 2.6X122MM WL AO SHAFT (BIT) ×2 IMPLANT
ELECT REM PT RETURN 9FT ADLT (ELECTROSURGICAL) ×2
ELECTRODE REM PT RTRN 9FT ADLT (ELECTROSURGICAL) ×1 IMPLANT
GAUZE SPONGE 4X4 12PLY STRL (GAUZE/BANDAGES/DRESSINGS) ×2 IMPLANT
GAUZE XEROFORM 5X9 LF (GAUZE/BANDAGES/DRESSINGS) ×2 IMPLANT
GLOVE BIOGEL M 7.0 STRL (GLOVE) ×2 IMPLANT
GLOVE BIOGEL PI IND STRL 7.0 (GLOVE) ×4 IMPLANT
GLOVE BIOGEL PI INDICATOR 7.0 (GLOVE) ×4
GLOVE SKINSENSE NS SZ8.0 LF (GLOVE) ×1
GLOVE SKINSENSE STRL SZ8.0 LF (GLOVE) ×1 IMPLANT
GLOVE SS N UNI LF 8.5 STRL (GLOVE) ×2 IMPLANT
GOWN STRL REUS W/TWL LRG LVL3 (GOWN DISPOSABLE) ×4 IMPLANT
GOWN STRL REUS W/TWL XL LVL3 (GOWN DISPOSABLE) ×2 IMPLANT
INST SET MINOR BONE (KITS) ×2 IMPLANT
K-WIRE 1.6X150 (WIRE)
K-WIRE FX150X1.6XKRSH (WIRE)
K-WIRE ORTHOPEDIC 1.4X150L (WIRE)
K-WIRE SMOOTH 2.0X150 (WIRE)
KIT TURNOVER KIT A (KITS) ×2 IMPLANT
KWIRE FX150X1.6XKRSH (WIRE) IMPLANT
KWIRE ORTHOPEDIC 1.4X150L (WIRE) IMPLANT
KWIRE SMOOTH 2.0X150 (WIRE) IMPLANT
MANIFOLD NEPTUNE II (INSTRUMENTS) ×2 IMPLANT
NEEDLE HYPO 21X1.5 SAFETY (NEEDLE) ×2 IMPLANT
NS IRRIG 1000ML POUR BTL (IV SOLUTION) ×2 IMPLANT
PACK BASIC LIMB (CUSTOM PROCEDURE TRAY) ×2 IMPLANT
PAD ABD 5X9 TENDERSORB (GAUZE/BANDAGES/DRESSINGS) ×4 IMPLANT
PAD ARMBOARD 7.5X6 YLW CONV (MISCELLANEOUS) ×2 IMPLANT
PAD CAST 4YDX4 CTTN HI CHSV (CAST SUPPLIES) ×1 IMPLANT
PADDING CAST COTTON 4X4 STRL (CAST SUPPLIES) ×2
PADDING WEBRIL 4 STERILE (GAUZE/BANDAGES/DRESSINGS) ×4 IMPLANT
PLATE FIBULA 4H (Plate) ×2 IMPLANT
SCREW BONE 3.5X16MM (Screw) ×2 IMPLANT
SCREW BONE 3.5X20MM (Screw) ×4 IMPLANT
SCREW BONE NON-LCKING 3.5X12MM (Screw) ×6 IMPLANT
SCREW NONLOCK 22MM (Screw) ×2 IMPLANT
SCREW NONLOCK 24MM (Screw) ×2 IMPLANT
SET BASIN LINEN APH (SET/KITS/TRAYS/PACK) ×2 IMPLANT
SPLINT IMMOBILIZER J 3INX20FT (CAST SUPPLIES) ×1
SPLINT J IMMOBILIZER 3X20FT (CAST SUPPLIES) ×1 IMPLANT
SPLINT J IMMOBILIZER 4X20FT (CAST SUPPLIES) IMPLANT
SPLINT J PLASTER J 4INX20Y (CAST SUPPLIES)
SPONGE LAP 18X18 RF (DISPOSABLE) ×2 IMPLANT
STAPLER VISISTAT 35W (STAPLE) ×2 IMPLANT
SUT ETHILON 3 0 FSL (SUTURE) IMPLANT
SUT MON AB 0 CT1 (SUTURE) ×2 IMPLANT
SUT MON AB 2-0 CT1 36 (SUTURE) IMPLANT
SYR 30ML LL (SYRINGE) ×2 IMPLANT
SYR BULB IRRIG 60ML STRL (SYRINGE) ×2 IMPLANT

## 2020-06-24 NOTE — Anesthesia Postprocedure Evaluation (Signed)
Anesthesia Post Note  Patient: Stefanie Farmer  Procedure(s) Performed: OPEN REDUCTION INTERNAL FIXATION (ORIF) ANKLE FRACTURE (Right Ankle)  Patient location during evaluation: Phase II Anesthesia Type: Regional Level of consciousness: awake Pain management: pain level controlled Vital Signs Assessment: post-procedure vital signs reviewed and stable Respiratory status: spontaneous breathing Cardiovascular status: blood pressure returned to baseline Postop Assessment: no headache Anesthetic complications: no   No complications documented.   Last Vitals:  Vitals:   06/24/20 1433 06/24/20 1435  BP:  111/60  Pulse: (!) 59   Resp: 20   Temp: 36.6 C   SpO2: 100%     Last Pain:  Vitals:   06/24/20 1433  TempSrc: Oral  PainSc:                  Windell Norfolk

## 2020-06-24 NOTE — Interval H&P Note (Signed)
History and Physical Interval Note:  06/24/2020 11:45 AM  Stefanie Farmer  has presented today for surgery, with the diagnosis of right ankle fracture.  The various methods of treatment have been discussed with the patient and family. After consideration of risks, benefits and other options for treatment, the patient has consented to  Procedure(s): OPEN REDUCTION INTERNAL FIXATION (ORIF) ANKLE FRACTURE (Right) as a surgical intervention.  The patient's history has been reviewed, patient examined, no change in status, stable for surgery.  I have reviewed the patient's chart and labs.  Questions were answered to the patient's satisfaction.     Fuller Canada

## 2020-06-24 NOTE — Anesthesia Procedure Notes (Signed)
Anesthesia Regional Block: Popliteal block   Pre-Anesthetic Checklist: ,, timeout performed, Correct Patient, Correct Site, Correct Laterality, Correct Procedure, Correct Position, site marked, Risks and benefits discussed,  Surgical consent,  Pre-op evaluation,  At surgeon's request and post-op pain management  Laterality: Right  Prep: chloraprep       Needles:  Injection technique: Single-shot  Needle Type: Stimiplex     Needle Length: 15cm  Needle Gauge: 22     Additional Needles:   Procedures:,,,, ultrasound used (permanent image in chart),,,,  Narrative:  Start time: 06/24/2020 10:59 AM End time: 06/24/2020 11:15 AM  Performed by: With CRNAs  Anesthesiologist: Windell Norfolk, MD CRNA: Brynda Peon, CRNA

## 2020-06-24 NOTE — Brief Op Note (Signed)
06/24/2020  1:10 PM  PATIENT:  Stefanie Farmer  24 y.o. female  PRE-OPERATIVE DIAGNOSIS:  right ankle fracture  POST-OPERATIVE DIAGNOSIS:  right ankle fracture.  Lateral malleolus with medial deltoid ligament tear  PROCEDURE:  Procedure(s): OPEN REDUCTION INTERNAL FIXATION (ORIF) ANKLE FRACTURE (Right)   Implants ankle solutions lateral ankle plate with multiple nonlocking screws  Findings intact syndesmosis lateral malleolus fracture Weber B type  SURGEON:  Surgeon(s) and Role:    * Ramello Cordial E, MD - Primary  Details of surgery  The patient was seen in the preop area identified as Hayley Lieser.  Right ankle confirmed the surgical site marked chart review completed including x-rays.  Implants checked and available and ready  Patient taken to the operating room after her ankle block and she was given general anesthesia.  She was in supine position.  We put a tourniquet on her right leg  The tourniquet would not stay in position therefore we did a timeout and inflated the tourniquet after elevation of the limb.  The leg was then prepped and draped sterilely  The incision was made over the fibula was taken down to bone and subperiosteal dissection exposed the bone and the fracture the fracture was opened cleaned irrigated of any debris and then with a pointed reduction clamp and lobster-claw clamp fracture was reduced and confirmed by x-ray  Attempted interfrag screw was unsuccessful  We therefore placed a precontoured plate slightly bending it to get better application to the bone and then starting from the proximal fragment placed 1 cortical screw and then placed one from the distal aspect of the fracture and then subsequently filled in the screws.  X-rays confirm that the fracture was reduced and the hardware was in good position.  We did a fluoroscopic examination for evaluation of the medial side and it was stable once the lateral malleolus was reduced  The wound was  irrigated and closed with 0 Monocryl and staples  Posterior splint was applied patient was extubated taken recovery room in stable condition  Plan  This patient will need a cast for at least 4 weeks perhaps even 6 or 8.  Because we did not get an interfrag screw and her weight will most likely keep her in the cast for 6 weeks  Followed by boot and protected weightbearing    Assisted by Cynthia Wren  Anesthesia preop ankle block and general anesthesia  No blood loss  No drains no local medicines  No specimens  Counts were correct  Tourniquet was set at 280 mmHg  Dragon Dictation   PLAN OF CARE: Discharge to home after PACU  PATIENT DISPOSITION:  PACU - hemodynamically stable.   Delay start of Pharmacological VTE agent (>24hrs) due to surgical blood loss or risk of bleeding: not applicable  

## 2020-06-24 NOTE — Anesthesia Procedure Notes (Signed)
Procedure Name: LMA Insertion Date/Time: 06/24/2020 11:55 AM Performed by: Julian Reil, CRNA Pre-anesthesia Checklist: Patient identified, Emergency Drugs available, Suction available and Patient being monitored Patient Re-evaluated:Patient Re-evaluated prior to induction Oxygen Delivery Method: Circle system utilized Preoxygenation: Pre-oxygenation with 100% oxygen Induction Type: IV induction Ventilation: Mask ventilation without difficulty LMA: LMA inserted LMA Size: 4.0 Tube type: Oral Number of attempts: 1 Placement Confirmation: positive ETCO2 Tube secured with: Tape Dental Injury: Teeth and Oropharynx as per pre-operative assessment

## 2020-06-24 NOTE — Anesthesia Procedure Notes (Signed)
Anesthesia Regional Block: Adductor canal block   Pre-Anesthetic Checklist: ,, timeout performed, Correct Patient, Correct Site, Correct Laterality, Correct Procedure, Correct Position, site marked, Risks and benefits discussed,  Surgical consent,  Pre-op evaluation,  At surgeon's request and post-op pain management  Laterality: Right  Prep: chloraprep       Needles:   Needle Type: Stimiplex     Needle Length: 15cm  Needle Gauge: 22     Additional Needles:   Procedures:,,,, ultrasound used (permanent image in chart),,,,  Narrative:  Start time: 06/24/2020 10:59 AM End time: 06/24/2020 11:15 AM  Performed by: With CRNAs  Anesthesiologist: Windell Norfolk, MD CRNA: Brynda Peon, CRNA

## 2020-06-24 NOTE — Anesthesia Preprocedure Evaluation (Addendum)
Anesthesia Evaluation  Patient identified by MRN, date of birth, ID band Patient awake    Reviewed: Allergy & Precautions, H&P , NPO status , Patient's Chart, lab work & pertinent test results, reviewed documented beta blocker date and time   Airway Mallampati: II  TM Distance: >3 FB Neck ROM: full    Dental no notable dental hx.    Pulmonary neg pulmonary ROS, Current Smoker and Patient abstained from smoking.,    Pulmonary exam normal breath sounds clear to auscultation       Cardiovascular Exercise Tolerance: Good negative cardio ROS   Rhythm:regular Rate:Normal     Neuro/Psych negative neurological ROS  negative psych ROS   GI/Hepatic negative GI ROS, Neg liver ROS,   Endo/Other  Morbid obesity  Renal/GU negative Renal ROS  negative genitourinary   Musculoskeletal   Abdominal   Peds  Hematology negative hematology ROS (+)   Anesthesia Other Findings   Reproductive/Obstetrics negative OB ROS                             Anesthesia Physical Anesthesia Plan  ASA: III  Anesthesia Plan: General   Post-op Pain Management:    Induction:   PONV Risk Score and Plan: Propofol infusion and TIVA  Airway Management Planned:   Additional Equipment:   Intra-op Plan:   Post-operative Plan:   Informed Consent: I have reviewed the patients History and Physical, chart, labs and discussed the procedure including the risks, benefits and alternatives for the proposed anesthesia with the patient or authorized representative who has indicated his/her understanding and acceptance.     Dental Advisory Given  Plan Discussed with: CRNA  Anesthesia Plan Comments:        Anesthesia Quick Evaluation

## 2020-06-24 NOTE — Transfer of Care (Signed)
Immediate Anesthesia Transfer of Care Note  Patient: Stefanie Farmer  Procedure(s) Performed: OPEN REDUCTION INTERNAL FIXATION (ORIF) ANKLE FRACTURE (Right Ankle)  Patient Location: PACU  Anesthesia Type:General  Level of Consciousness: drowsy  Airway & Oxygen Therapy: Patient Spontanous Breathing and Patient connected to face mask oxygen  Post-op Assessment: Report given to RN and Post -op Vital signs reviewed and stable  Post vital signs: Reviewed and stable  Last Vitals:  Vitals Value Taken Time  BP    Temp    Pulse 68 06/24/20 1312  Resp 16 06/24/20 1312  SpO2 98 % 06/24/20 1312  Vitals shown include unvalidated device data.  Last Pain:  Vitals:   06/24/20 0955  TempSrc: Oral  PainSc: 0-No pain      Patients Stated Pain Goal: 7 (06/24/20 0955)  Complications: No complications documented.

## 2020-06-24 NOTE — Op Note (Signed)
06/24/2020  1:10 PM  PATIENT:  Stefanie Farmer  25 y.o. female  PRE-OPERATIVE DIAGNOSIS:  right ankle fracture  POST-OPERATIVE DIAGNOSIS:  right ankle fracture.  Lateral malleolus with medial deltoid ligament tear  PROCEDURE:  Procedure(s): OPEN REDUCTION INTERNAL FIXATION (ORIF) ANKLE FRACTURE (Right)   Implants ankle solutions lateral ankle plate with multiple nonlocking screws  Findings intact syndesmosis lateral malleolus fracture Weber B type  SURGEON:  Surgeon(s) and Role:    * Vickki Hearing, MD - Primary  Details of surgery  The patient was seen in the preop area identified as Stefanie Farmer.  Right ankle confirmed the surgical site marked chart review completed including x-rays.  Implants checked and available and ready  Patient taken to the operating room after her ankle block and she was given general anesthesia.  She was in supine position.  We put a tourniquet on her right leg  The tourniquet would not stay in position therefore we did a timeout and inflated the tourniquet after elevation of the limb.  The leg was then prepped and draped sterilely  The incision was made over the fibula was taken down to bone and subperiosteal dissection exposed the bone and the fracture the fracture was opened cleaned irrigated of any debris and then with a pointed reduction clamp and lobster-claw clamp fracture was reduced and confirmed by x-ray  Attempted interfrag screw was unsuccessful  We therefore placed a precontoured plate slightly bending it to get better application to the bone and then starting from the proximal fragment placed 1 cortical screw and then placed one from the distal aspect of the fracture and then subsequently filled in the screws.  X-rays confirm that the fracture was reduced and the hardware was in good position.  We did a fluoroscopic examination for evaluation of the medial side and it was stable once the lateral malleolus was reduced  The wound was  irrigated and closed with 0 Monocryl and staples  Posterior splint was applied patient was extubated taken recovery room in stable condition  Plan  This patient will need a cast for at least 4 weeks perhaps even 6 or 8.  Because we did not get an interfrag screw and her weight will most likely keep her in the cast for 6 weeks  Followed by boot and protected weightbearing    Assisted by Canary Brim  Anesthesia preop ankle block and general anesthesia  No blood loss  No drains no local medicines  No specimens  Counts were correct  Tourniquet was set at 280 mmHg  Dragon Dictation   PLAN OF CARE: Discharge to home after PACU  PATIENT DISPOSITION:  PACU - hemodynamically stable.   Delay start of Pharmacological VTE agent (>24hrs) due to surgical blood loss or risk of bleeding: not applicable

## 2020-06-24 NOTE — Discharge Instructions (Signed)
PATIENT INSTRUCTIONS POST-ANESTHESIA  IMMEDIATELY FOLLOWING SURGERY:  Do not drive or operate machinery for the first twenty four hours after surgery.  Do not make any important decisions for twenty four hours after surgery or while taking narcotic pain medications or sedatives.  If you develop intractable nausea and vomiting or a severe headache please notify your doctor immediately.  FOLLOW-UP:  Please make an appointment with your surgeon as instructed. You do not need to follow up with anesthesia unless specifically instructed to do so.  WOUND CARE INSTRUCTIONS (if applicable):  Keep a dry clean dressing on the anesthesia/puncture wound site if there is drainage.  Once the wound has quit draining you may leave it open to air.  Generally you should leave the bandage intact for twenty four hours unless there is drainage.  If the epidural site drains for more than 36-48 hours please call the anesthesia department.  QUESTIONS?:  Please feel free to call your physician or the hospital operator if you have any questions, and they will be happy to assist you.      Incision Care, Adult An incision is a cut that a doctor makes in your skin for surgery. Most times, these cuts are closed after surgery. Your cut from surgery may be closed with:  Stitches (sutures).  Staples.  Skin glue.  Skin tape (adhesive) strips. You may need to return to your doctor to have stitches or staples taken out. This may happen many days or many weeks after your surgery. You need to take good care of your cut so it does not get infected. Follow instructions from your doctor about how to care for your cut. Supplies needed:  Soap and water.  A clean hand towel.  Wound cleanser.  A clean bandage (dressing), if needed.  Cream or ointment, if told by your doctor.  Clean gauze. How to care for your cut from surgery Cleaning your cut Ask your doctor how to clean your cut. You may need to:  Use mild soap and  water, or a wound cleanser.  Use a clean gauze to pat your cut dry after you clean it. Changing your bandage  Wash your hands with soap and water for at least 20 seconds before and after you change your bandage. If you cannot use soap and water, use hand sanitizer.  Change your bandage as told by your doctor.  Leave stitches, staples, skin glue, or skin tape strips in place. They may need to stay in place for 2 weeks or longer. If tape strips get loose and curl up, you may trim the loose edges. Do not remove tape strips completely unless your doctor says it is okay.  Put a cream or ointment on your cut. Do this only as told.  Cover your cut with a clean bandage.  Ask your doctor when you can leave your cut uncovered. Checking for infection Check your cut area every day for signs of infection. Check for:  More redness, swelling, or pain.  More fluid or blood.  Warmth.  Pus or a bad smell.   Follow these instructions at home Medicines  Take over-the-counter and prescription medicines only as told by your doctor.  If you were prescribed an antibiotic medicine, cream, or ointment, use it as told by your doctor. Do not stop using the antibiotic even if your condition improves. Eating and drinking  Eat foods that have a lot of certain nutrients, such as protein, vitamin A, and vitamin C. These foods help your  cut heal. ? Foods rich in protein include meat, fish, eggs, dairy, beans, and nuts. ? Foods rich in vitamin A include carrots and dark green, leafy vegetables. ? Foods rich in vitamin C include citrus fruits, tomatoes, broccoli, and peppers.  Drink enough fluid to keep your pee (urine) pale yellow. General instructions  Do not take baths, swim, use a hot tub, or put your cut underwater until your doctor approves. Ask your doctor if you may take showers. You may only be allowed to take sponge baths.  Limit movement around your cut. This helps with healing. ? Try not to  strain, lift, or exercise for the first 2 weeks, or for as long as told by your doctor. ? Return to your normal activities as told by your doctor. Ask your doctor what activities are safe for you.  Do not scratch or pick at your cut. Keep it covered as told by your doctor.  Protect your cut from the sun when you are outside for the first 6 months, or for as long as told by your doctor. Cover up the scar area or put on sunscreen that has an SPF of at least 30.  Do not use any products that contain nicotine or tobacco, such as cigarettes, e-cigarettes, and chewing tobacco. These can delay cut healing after surgery. If you need help quitting, ask your doctor.  Keep all follow-up visits as told by your doctor. This is important.   Contact a doctor if:  You have any of these signs of infection around your cut: ? More redness, swelling, or pain. ? More fluid or blood. ? Warmth. ? Pus or a bad smell.  You have a fever.  You feel like you may vomit (nauseous).  You vomit.  You are dizzy.  Your stitches, staples, skin glue, or tape strips come undone. Get help right away if:  Your cut has a red streak coming from it.  Your cut bleeds through your bandage, and bleeding does not stop with gentle pressure.  Your cut opens up and comes apart.  Your body reacts very badly to an infection. This may include: ? A fever, chills, or feeling cold. ? Feeling mixed up, worried, or nervous. ? Very bad pain. ? Trouble breathing. ? A fast heartbeat. ? Clammy or sweaty skin. ? A rash. These symptoms may be an emergency. Do not wait to see if the symptoms will go away. Get medical help right away. Call your local emergency services (911 in the [place] Do not drive yourself to the hospital. Summary  Follow instructions from your doctor about how to care for your cut from surgery.  Wash your hands with soap and water for at least 20 seconds before and after you change your bandage. If you cannot  use soap and water, use hand sanitizer.  Check your cut area every day for signs of infection.  Keep all follow-up visits as told by your doctor. This is important. This information is not intended to replace advice given to you by your health care provider. Make sure you discuss any questions you have with your health care provider. Document Revised: 03/07/2019 Document Reviewed: 03/07/2019 Elsevier Patient Education  2021 [employer].

## 2020-06-25 ENCOUNTER — Encounter (HOSPITAL_COMMUNITY): Payer: Self-pay | Admitting: Orthopedic Surgery

## 2020-06-25 ENCOUNTER — Telehealth: Payer: Self-pay | Admitting: Orthopedic Surgery

## 2020-06-25 NOTE — Telephone Encounter (Signed)
Advised ice elevate take meds as directed. She can try some benadryl in evenings to help rest. She voiced understanding.   Another female got on phone and asked for stronger pain meds, advised the  Hydrocodone is the strongest Dr  Romeo Apple prescribes  To you FYI

## 2020-06-25 NOTE — Telephone Encounter (Signed)
Patient called and stated that she has been up all night with a lot of pain.  She said the pain medication is not helping.  Can Dr Romeo Apple give her something else or give any advice as to what she can do to help with the pain?  Please call the patient.  Thanks

## 2020-07-02 ENCOUNTER — Ambulatory Visit (INDEPENDENT_AMBULATORY_CARE_PROVIDER_SITE_OTHER): Payer: Commercial Managed Care - HMO | Admitting: Orthopedic Surgery

## 2020-07-02 ENCOUNTER — Encounter: Payer: Self-pay | Admitting: Orthopedic Surgery

## 2020-07-02 ENCOUNTER — Other Ambulatory Visit: Payer: Self-pay

## 2020-07-02 DIAGNOSIS — S8261XD Displaced fracture of lateral malleolus of right fibula, subsequent encounter for closed fracture with routine healing: Secondary | ICD-10-CM

## 2020-07-02 MED ORDER — IBUPROFEN 800 MG PO TABS: 800.0000 mg | ORAL_TABLET | Freq: Three times a day (TID) | ORAL | 1 refills | Status: DC | PRN

## 2020-07-02 NOTE — Patient Instructions (Signed)
No weight bearing   Keep moving the foot   Take ibuprofen and norco

## 2020-07-02 NOTE — Progress Notes (Signed)
Chief Complaint  Patient presents with  . Post-op Follow-up    Ankle ORIF 06/24/20 improving     Encounter Diagnosis  Name Primary?  . Closed displaced fracture of lateral malleolus of right fibula with routine healing, subsequent encounter ORIF 06/24/20 Yes    Lateral wound looks good patient placed back into a sugar tong splint  No weightbearing  X-ray Staples out in a week  Meds ordered this encounter  Medications  . ibuprofen (ADVIL) 800 MG tablet    Sig: Take 1 tablet (800 mg total) by mouth every 8 (eight) hours as needed.    Dispense:  90 tablet    Refill:  1

## 2020-07-09 ENCOUNTER — Ambulatory Visit (INDEPENDENT_AMBULATORY_CARE_PROVIDER_SITE_OTHER): Payer: Commercial Managed Care - HMO | Admitting: Orthopedic Surgery

## 2020-07-09 ENCOUNTER — Other Ambulatory Visit: Payer: Self-pay

## 2020-07-09 ENCOUNTER — Ambulatory Visit: Payer: Commercial Managed Care - HMO

## 2020-07-09 DIAGNOSIS — S8261XD Displaced fracture of lateral malleolus of right fibula, subsequent encounter for closed fracture with routine healing: Secondary | ICD-10-CM

## 2020-07-09 NOTE — Progress Notes (Signed)
Chief Complaint  Patient presents with  . Leg Pain    R/not so bad today/the swelling makes it hurt.    Post op   06/24/2020 date of surgery  This is postop day #15   Right lateral plate she did have a deltoid ligament injury no syndesmosis injury  Plate looks good ankle looks good  Wound looks good Staples were removed  Patient is placed in a cam walker protected weightbearing active range of motion  F/u 4 weeks   Now on ibuprofen   Encounter Diagnosis  Name Primary?  . Closed displaced fracture of lateral malleolus of right fibula with routine healing, subsequent encounter ORIF 06/24/20 Yes

## 2020-08-06 ENCOUNTER — Other Ambulatory Visit: Payer: Self-pay

## 2020-08-06 ENCOUNTER — Ambulatory Visit: Payer: Commercial Managed Care - HMO

## 2020-08-06 ENCOUNTER — Ambulatory Visit (INDEPENDENT_AMBULATORY_CARE_PROVIDER_SITE_OTHER): Payer: Commercial Managed Care - HMO | Admitting: Orthopedic Surgery

## 2020-08-06 ENCOUNTER — Encounter: Payer: Self-pay | Admitting: Orthopedic Surgery

## 2020-08-06 DIAGNOSIS — S8261XD Displaced fracture of lateral malleolus of right fibula, subsequent encounter for closed fracture with routine healing: Secondary | ICD-10-CM | POA: Diagnosis not present

## 2020-08-06 NOTE — Progress Notes (Signed)
Postop visit  Status post open treatment internal fixation right ankle  Date of surgery January 25  This is postop day #43  Stefanie Farmer is off all opioid medication just taking ibuprofen she is ambulatory with her cam walker on no problems x-ray looks good follow-up in 4 weeks for repeat x-ray  Patient given permission to drive without the boot on

## 2020-09-08 ENCOUNTER — Other Ambulatory Visit: Payer: Self-pay

## 2020-09-08 ENCOUNTER — Encounter: Payer: Self-pay | Admitting: Orthopedic Surgery

## 2020-09-08 ENCOUNTER — Ambulatory Visit: Payer: Commercial Managed Care - HMO

## 2020-09-08 ENCOUNTER — Ambulatory Visit (INDEPENDENT_AMBULATORY_CARE_PROVIDER_SITE_OTHER): Payer: Commercial Managed Care - HMO | Admitting: Orthopedic Surgery

## 2020-09-08 VITALS — BP 117/82 | HR 96 | Ht 62.0 in

## 2020-09-08 DIAGNOSIS — S8261XD Displaced fracture of lateral malleolus of right fibula, subsequent encounter for closed fracture with routine healing: Secondary | ICD-10-CM

## 2020-09-08 NOTE — Progress Notes (Signed)
Chief Complaint  Patient presents with  . Ankle Injury    Right ankle    06/24/2020 DOS   Continues to do well 76 days after ORIF of the right ankle lateral fibular plate  She is in a short cam walker boot she is already started taking herself from.  Her ankle exam shows a well-healed lateral incision normal range of motion no medial  deltoid ligament tenderness  X-ray shows  Well healed fracture intact plate intact ankle mortise  She can self correct her tapering from the boot see her in 9 months for x-ray 1 year

## 2021-06-15 ENCOUNTER — Ambulatory Visit
Admission: EM | Admit: 2021-06-15 | Discharge: 2021-06-15 | Disposition: A | Discharge status: Home / Self Care | Payer: Medicaid Other | Attending: Urgent Care | Admitting: Urgent Care

## 2021-06-15 ENCOUNTER — Other Ambulatory Visit: Payer: Self-pay

## 2021-06-15 ENCOUNTER — Ambulatory Visit: Admit: 2021-06-15 | Payer: Self-pay | Source: Home / Self Care

## 2021-06-15 ENCOUNTER — Encounter: Payer: Self-pay | Admitting: Emergency Medicine

## 2021-06-15 DIAGNOSIS — M545 Low back pain, unspecified: Secondary | ICD-10-CM | POA: Insufficient documentation

## 2021-06-15 DIAGNOSIS — R350 Frequency of micturition: Secondary | ICD-10-CM | POA: Insufficient documentation

## 2021-06-15 DIAGNOSIS — N3001 Acute cystitis with hematuria: Secondary | ICD-10-CM | POA: Diagnosis not present

## 2021-06-15 LAB — POCT URINALYSIS DIP (MANUAL ENTRY)
Bilirubin, UA: NEGATIVE
Glucose, UA: NEGATIVE mg/dL
Ketones, POC UA: NEGATIVE mg/dL
Nitrite, UA: POSITIVE — AB
Spec Grav, UA: 1.025 (ref 1.010–1.025)
Urobilinogen, UA: 0.2 E.U./dL
pH, UA: 6 (ref 5.0–8.0)

## 2021-06-15 LAB — POCT URINE PREGNANCY: Preg Test, Ur: NEGATIVE

## 2021-06-15 MED ORDER — CIPROFLOXACIN HCL 500 MG PO TABS: 500.0000 mg | ORAL_TABLET | Freq: Two times a day (BID) | ORAL | 0 refills | Status: DC

## 2021-06-15 NOTE — ED Provider Notes (Signed)
[place]   MRN: [id number] DOB: 1995-06-14  Subjective:   Stefanie Farmer is a 26 y.o. female presenting for 3-day history of acute onset fevers, chills, low back pain, urinary urgency.  Has now progressed to have right-sided flank pain/hip pain.  No dysuria, hematuria that she can visualize.  No concern for sexually transmitted infection.  LMP was late, was 06/04/2021.  She is not on OCP, is sexually active.  No current facility-administered medications for this encounter.  Current Outpatient Medications:    ibuprofen (ADVIL) 800 MG tablet, Take 1 tablet (800 mg total) by mouth every 8 (eight) hours as needed., Disp: 90 tablet, Rfl: 1   No Known Allergies  History reviewed. No pertinent past medical history.   Past Surgical History:  Procedure Laterality Date   FOOT SURGERY     ORIF ANKLE FRACTURE Right 06/24/2020   Procedure: OPEN REDUCTION INTERNAL FIXATION (ORIF) ANKLE FRACTURE;  Surgeon: Carole Civil, MD;  Location: [facility];  Service: Orthopedics;  Laterality: Right;    Family History  Family history unknown: Yes    Social History   Tobacco Use   Smoking status: Every Day   Smokeless tobacco: Never  Vaping Use   Vaping Use: Some days  Substance Use Topics   Alcohol use: Yes   Drug use: Yes    Types: Marijuana    ROS   Objective:   Vitals: BP 136/80 (BP Location: Right Arm)    Pulse (!) 103    Temp 98.2 F (36.8 C) (Oral)    Resp 18    Ht 5\' 3"  (1.6 m)    Wt 270 lb (122.5 kg)    LMP 06/04/2021 (Exact Date)    SpO2 98%    BMI 47.83 kg/m   Physical Exam Constitutional:      General: She is not in acute distress.    Appearance: Normal appearance. She is well-developed. She is not ill-appearing, toxic-appearing or diaphoretic.  HENT:     Head: Normocephalic and atraumatic.     Nose: Nose normal.     Mouth/Throat:     Mouth: Mucous membranes are moist.     Pharynx: Oropharynx is clear.  Eyes:     General: No scleral icterus.        Right eye: No discharge.        Left eye: No discharge.     Extraocular Movements: Extraocular movements intact.     Conjunctiva/sclera: Conjunctivae normal.     Pupils: Pupils are equal, round, and reactive to light.  Cardiovascular:     Rate and Rhythm: Normal rate.  Pulmonary:     Effort: Pulmonary effort is normal.  Abdominal:     General: Bowel sounds are normal. There is no distension.     Palpations: Abdomen is soft. There is no mass.     Tenderness: There is no abdominal tenderness. There is no right CVA tenderness, left CVA tenderness, guarding or rebound.  Skin:    General: Skin is warm and dry.  Neurological:     General: No focal deficit present.     Mental Status: She is alert and oriented to person, place, and time.  Psychiatric:        Mood and Affect: Mood normal.        Behavior: Behavior normal.        Thought Content: Thought content normal.        Judgment: Judgment normal.    Results for orders placed or performed during  the hospital encounter of 06/15/21 (from the past 24 hour(s))  POCT urinalysis dipstick     Status: Abnormal   Collection Time: 06/15/21  1:31 PM  Result Value Ref Range   Color, UA yellow yellow   Clarity, UA cloudy (A) clear   Glucose, UA negative negative mg/dL   Bilirubin, UA negative negative   Ketones, POC UA negative negative mg/dL   Spec Grav, UA 1.025 1.010 - 1.025   Blood, UA large (A) negative   pH, UA 6.0 5.0 - 8.0   Protein Ur, POC trace (A) negative mg/dL   Urobilinogen, UA 0.2 0.2 or 1.0 E.U./dL   Nitrite, UA Positive (A) Negative   Leukocytes, UA Large (3+) (A) Negative  POCT urine pregnancy     Status: None   Collection Time: 06/15/21  1:44 PM  Result Value Ref Range   Preg Test, Ur Negative Negative    Assessment and Plan :   PDMP not reviewed this encounter.  1. Acute cystitis with hematuria   2. Urinary frequency   3. Acute bilateral low back pain without sciatica     Start ciprofloxacin to cover for  acute cystitis, urine culture pending.  Recommended aggressive hydration, limiting urinary irritants. Counseled patient on potential for adverse effects with medications prescribed/recommended today, ER and return-to-clinic precautions discussed, patient verbalized understanding.    Jaynee Eagles, PA-C 06/15/21 1345

## 2021-06-15 NOTE — ED Triage Notes (Addendum)
Fever,chills on Saturday, bilateral hip pain/lower back pain on Sunday,fever on Monday 101.9. Pt also reports intermittent right flank pain and urinary frequency.  Pt has been trying otc tylenol and ibuprofen with some relief of symptoms. Pt reports home covid test yesterday was negative.

## 2021-06-17 LAB — URINE CULTURE: Culture: >=100000 — AB

## 2021-06-22 ENCOUNTER — Ambulatory Visit: Payer: Commercial Managed Care - HMO | Admitting: Orthopedic Surgery

## 2021-06-22 ENCOUNTER — Encounter: Payer: Self-pay | Admitting: Orthopedic Surgery

## 2021-07-22 ENCOUNTER — Emergency Department (HOSPITAL_COMMUNITY): Payer: Medicaid Other

## 2021-07-22 ENCOUNTER — Encounter (HOSPITAL_COMMUNITY): Payer: Self-pay

## 2021-07-22 ENCOUNTER — Inpatient Hospital Stay (HOSPITAL_COMMUNITY)
Admission: EM | Admit: 2021-07-22 | Discharge: 2021-07-24 | DRG: 660 | Disposition: A | Discharge status: Home / Self Care | Payer: Medicaid Other | Attending: Family Medicine | Admitting: Family Medicine

## 2021-07-22 ENCOUNTER — Other Ambulatory Visit: Payer: Self-pay

## 2021-07-22 DIAGNOSIS — N390 Urinary tract infection, site not specified: Secondary | ICD-10-CM | POA: Diagnosis present

## 2021-07-22 DIAGNOSIS — Z20822 Contact with and (suspected) exposure to covid-19: Secondary | ICD-10-CM | POA: Diagnosis present

## 2021-07-22 DIAGNOSIS — B9620 Unspecified Escherichia coli [E. coli] as the cause of diseases classified elsewhere: Secondary | ICD-10-CM | POA: Diagnosis present

## 2021-07-22 DIAGNOSIS — N136 Pyonephrosis: Principal | ICD-10-CM | POA: Diagnosis present

## 2021-07-22 DIAGNOSIS — R109 Unspecified abdominal pain: Secondary | ICD-10-CM | POA: Diagnosis present

## 2021-07-22 DIAGNOSIS — N1330 Unspecified hydronephrosis: Secondary | ICD-10-CM

## 2021-07-22 DIAGNOSIS — Z6841 Body Mass Index (BMI) 40.0 and over, adult: Secondary | ICD-10-CM | POA: Diagnosis not present

## 2021-07-22 DIAGNOSIS — N200 Calculus of kidney: Secondary | ICD-10-CM

## 2021-07-22 DIAGNOSIS — Z87891 Personal history of nicotine dependence: Secondary | ICD-10-CM

## 2021-07-22 DIAGNOSIS — N201 Calculus of ureter: Secondary | ICD-10-CM

## 2021-07-22 LAB — LACTIC ACID, PLASMA
Lactic Acid, Venous: 2 mmol/L (ref 0.5–1.9)
Lactic Acid, Venous: 1.4 mmol/L (ref 0.5–1.9)

## 2021-07-22 LAB — BASIC METABOLIC PANEL
Anion gap: 8 (ref 5–15)
BUN: 11 mg/dL (ref 6–20)
CO2: 23 mmol/L (ref 22–32)
Calcium: 8.6 mg/dL — ABNORMAL LOW (ref 8.9–10.3)
Chloride: 104 mmol/L (ref 98–111)
Creatinine, Ser: 0.96 mg/dL (ref 0.44–1.00)
GFR, Estimated: >60 mL/min (ref >60)
Glucose, Bld: 124 mg/dL — ABNORMAL HIGH (ref 70–99)
Potassium: 4 mmol/L (ref 3.5–5.1)
Sodium: 135 mmol/L (ref 135–145)

## 2021-07-22 LAB — HIV ANTIBODY (ROUTINE TESTING W REFLEX): HIV Screen 4th Generation wRfx: NONREACTIVE

## 2021-07-22 LAB — URINALYSIS, ROUTINE W REFLEX MICROSCOPIC
Bilirubin Urine: NEGATIVE
Glucose, UA: NEGATIVE mg/dL
Ketones, ur: NEGATIVE mg/dL
Nitrite: POSITIVE — AB
Protein, ur: 30 mg/dL — AB
Specific Gravity, Urine: 1.023 (ref 1.005–1.030)
WBC, UA: >50 WBC/hpf — ABNORMAL HIGH (ref 0–5)
pH: 5 (ref 5.0–8.0)

## 2021-07-22 LAB — CBC
HCT: 37.3 % (ref 36.0–46.0)
Hemoglobin: 11.8 g/dL — ABNORMAL LOW (ref 12.0–15.0)
MCH: 28.5 pg (ref 26.0–34.0)
MCHC: 31.6 g/dL (ref 30.0–36.0)
MCV: 90.1 fL (ref 80.0–100.0)
Platelets: 293 10*3/uL (ref 150–400)
RBC: 4.14 MIL/uL (ref 3.87–5.11)
RDW: 13.4 % (ref 11.5–15.5)
WBC: 17.7 10*3/uL — ABNORMAL HIGH (ref 4.0–10.5)
nRBC: 0 % (ref 0.0–0.2)

## 2021-07-22 LAB — RESP PANEL BY RT-PCR (FLU A&B, COVID) ARPGX2
Influenza A by PCR: NEGATIVE
Influenza B by PCR: NEGATIVE
SARS Coronavirus 2 by RT PCR: NEGATIVE

## 2021-07-22 LAB — POC URINE PREG, ED: Preg Test, Ur: NEGATIVE

## 2021-07-22 LAB — GLUCOSE, CAPILLARY: Glucose-Capillary: 84 mg/dL (ref 70–99)

## 2021-07-22 MED ORDER — ONDANSETRON HCL 4 MG/2ML IJ SOLN: 4.0000 mg | Freq: Four times a day (QID) | INTRAMUSCULAR | Status: DC | PRN

## 2021-07-22 MED ORDER — ACETAMINOPHEN 325 MG PO TABS: 650.0000 mg | ORAL_TABLET | Freq: Four times a day (QID) | ORAL | Status: DC | PRN

## 2021-07-22 MED ORDER — MORPHINE SULFATE (PF) 4 MG/ML IV SOLN
4.0000 mg | INTRAVENOUS | Status: DC | PRN
  Administered 2021-07-22: 4 mg via INTRAVENOUS

## 2021-07-22 MED ORDER — ONDANSETRON HCL 4 MG/2ML IJ SOLN
INTRAMUSCULAR | Status: AC
  Filled 2021-07-22: qty 2

## 2021-07-22 MED ORDER — TRAZODONE HCL 50 MG PO TABS
50.0000 mg | ORAL_TABLET | Freq: Every evening | ORAL | Status: DC | PRN
  Administered 2021-07-22: 50 mg via ORAL
  Filled 2021-07-22: qty 1

## 2021-07-22 MED ORDER — SODIUM CHLORIDE 0.9% FLUSH: 3.0000 mL | Freq: Two times a day (BID) | INTRAVENOUS | Status: DC

## 2021-07-22 MED ORDER — FENTANYL CITRATE PF 50 MCG/ML IJ SOSY
100.0000 ug | PREFILLED_SYRINGE | Freq: Once | INTRAMUSCULAR | Status: AC
  Administered 2021-07-22: 100 ug via INTRAVENOUS
  Filled 2021-07-22: qty 2

## 2021-07-22 MED ORDER — SODIUM CHLORIDE 0.9 % IV SOLN
2.0000 g | INTRAVENOUS | Status: DC
  Administered 2021-07-22: 2 g via INTRAVENOUS
  Filled 2021-07-22 (×2): qty 20

## 2021-07-22 MED ORDER — SODIUM CHLORIDE 0.9 % IV BOLUS
2000.0000 mL | Freq: Once | INTRAVENOUS | Status: AC
Start: 2021-07-22 — End: 2021-07-22
  Administered 2021-07-22: 2000 mL via INTRAVENOUS

## 2021-07-22 MED ORDER — TAMSULOSIN HCL 0.4 MG PO CAPS
0.4000 mg | ORAL_CAPSULE | Freq: Every day | ORAL | Status: DC
  Administered 2021-07-22: 0.4 mg via ORAL
  Filled 2021-07-22 (×2): qty 1

## 2021-07-22 MED ORDER — IOHEXOL 300 MG/ML  SOLN: 100.0000 mL | Freq: Once | INTRAMUSCULAR | Status: DC | PRN

## 2021-07-22 MED ORDER — IOHEXOL 300 MG/ML  SOLN
100.0000 mL | Freq: Once | INTRAMUSCULAR | Status: AC | PRN
  Administered 2021-07-22: 100 mL via INTRAVENOUS

## 2021-07-22 MED ORDER — SODIUM CHLORIDE 0.9 % IV SOLN
INTRAVENOUS | Status: DC
Start: 2021-07-22 — End: 2021-07-23

## 2021-07-22 MED ORDER — FENTANYL CITRATE PF 50 MCG/ML IJ SOSY
50.0000 ug | PREFILLED_SYRINGE | INTRAMUSCULAR | Status: DC | PRN
  Administered 2021-07-22 (×2): 50 ug via INTRAVENOUS
  Filled 2021-07-22 (×2): qty 1

## 2021-07-22 MED ORDER — ACETAMINOPHEN 650 MG RE SUPP: 650.0000 mg | Freq: Four times a day (QID) | RECTAL | Status: DC | PRN

## 2021-07-22 MED ORDER — SODIUM CHLORIDE 0.9 % IV SOLN: 250.0000 mL | INTRAVENOUS | Status: DC | PRN

## 2021-07-22 MED ORDER — ONDANSETRON HCL 4 MG/2ML IJ SOLN
4.0000 mg | Freq: Once | INTRAMUSCULAR | Status: AC
  Administered 2021-07-22: 4 mg via INTRAVENOUS
  Filled 2021-07-22: qty 2

## 2021-07-22 MED ORDER — FENTANYL CITRATE PF 50 MCG/ML IJ SOSY
100.0000 ug | PREFILLED_SYRINGE | INTRAMUSCULAR | Status: DC | PRN
  Administered 2021-07-22: 100 ug via INTRAVENOUS

## 2021-07-22 MED ORDER — FLUCONAZOLE IN SODIUM CHLORIDE 200-0.9 MG/100ML-% IV SOLN
200.0000 mg | INTRAVENOUS | Status: DC
  Administered 2021-07-22: 200 mg via INTRAVENOUS
  Filled 2021-07-22: qty 100

## 2021-07-22 MED ORDER — SODIUM CHLORIDE 0.9 % IV BOLUS
1000.0000 mL | Freq: Once | INTRAVENOUS | Status: AC
  Administered 2021-07-22: 1000 mL via INTRAVENOUS

## 2021-07-22 MED ORDER — HEPARIN SODIUM (PORCINE) 5000 UNIT/ML IJ SOLN
5000.0000 [IU] | Freq: Three times a day (TID) | INTRAMUSCULAR | Status: AC
  Administered 2021-07-22 (×2): 5000 [IU] via SUBCUTANEOUS
  Filled 2021-07-22 (×2): qty 1

## 2021-07-22 MED ORDER — ONDANSETRON HCL 4 MG PO TABS: 4.0000 mg | ORAL_TABLET | Freq: Four times a day (QID) | ORAL | Status: DC | PRN

## 2021-07-22 MED ORDER — POLYETHYLENE GLYCOL 3350 17 G PO PACK: 17.0000 g | PACK | Freq: Every day | ORAL | Status: DC | PRN

## 2021-07-22 MED ORDER — KETOROLAC TROMETHAMINE 30 MG/ML IJ SOLN
30.0000 mg | Freq: Four times a day (QID) | INTRAMUSCULAR | Status: AC
  Administered 2021-07-22 – 2021-07-23 (×4): 30 mg via INTRAVENOUS
  Filled 2021-07-22 (×4): qty 1

## 2021-07-22 MED ORDER — SODIUM CHLORIDE 0.9% FLUSH: 3.0000 mL | INTRAVENOUS | Status: DC | PRN

## 2021-07-22 MED ORDER — FENTANYL CITRATE PF 50 MCG/ML IJ SOSY
PREFILLED_SYRINGE | INTRAMUSCULAR | Status: AC
  Filled 2021-07-22: qty 2

## 2021-07-22 MED ORDER — MORPHINE SULFATE (PF) 4 MG/ML IV SOLN
INTRAVENOUS | Status: AC
  Filled 2021-07-22: qty 1

## 2021-07-22 MED ORDER — ONDANSETRON HCL 4 MG/2ML IJ SOLN
4.0000 mg | Freq: Once | INTRAMUSCULAR | Status: AC
  Administered 2021-07-22: 4 mg via INTRAVENOUS

## 2021-07-22 MED ORDER — BISACODYL 10 MG RE SUPP: 10.0000 mg | Freq: Every day | RECTAL | Status: DC | PRN

## 2021-07-22 NOTE — ED Provider Notes (Signed)
[facility] Provider Note   CSN: [id number] Arrival date & time: 07/22/21  [id number]     History  Chief Complaint  Patient presents with   Flank Pain    Stefanie Farmer is a 26 y.o. female.  26 yo woman presents with colicky right flank pain for 2 days. Pain started in her right back and was intermittent until this morning where it has been constant and 9/10 pain. Pain is in the right flank, but radiates down to the groin. Patient reports dysuria and urgency. Has felt subjectively warm and had chills. She has had one episode of nephrolithiasis before. PMH otherwise non-contributory.      Home Medications Prior to Admission medications   Medication Sig Start Date End Date Taking? Authorizing Provider  ciprofloxacin (CIPRO) 500 MG tablet Take 1 tablet (500 mg total) by mouth 2 (two) times daily. Patient not taking: Reported on 07/22/2021 06/15/21   Jaynee Eagles, PA-C  ibuprofen (ADVIL) 800 MG tablet Take 1 tablet (800 mg total) by mouth every 8 (eight) hours as needed. Patient not taking: Reported on 07/22/2021 07/02/20   Carole Civil, MD      Allergies    Patient has no known allergies.    Review of Systems   Review of Systems  Constitutional:  Positive for chills and fever. Negative for appetite change.  HENT:  Negative for rhinorrhea, sneezing and sore throat.   Respiratory:  Negative for cough, shortness of breath and wheezing.   Cardiovascular:  Negative for chest pain.  Gastrointestinal:  Positive for abdominal pain, nausea and vomiting. Negative for constipation and diarrhea.  Genitourinary:  Positive for dysuria, flank pain and urgency. Negative for frequency, vaginal bleeding, vaginal discharge and vaginal pain.  All other systems reviewed and are negative.  Physical Exam Updated Vital Signs BP (!) 103/51    Pulse (!) 108    Temp 99.8 F (37.7 C) (Oral)    Resp 18    Ht 5\' 3"  (1.6 m)    Wt 127 kg    SpO2 94%    BMI 49.60 kg/m  Physical  Exam Vitals and nursing note reviewed.  Constitutional:      General: She is not in acute distress.    Appearance: She is obese. She is ill-appearing. She is not toxic-appearing or diaphoretic.     Comments: Appears in pain  HENT:     Head: Normocephalic and atraumatic.  Eyes:     General: No scleral icterus.    Conjunctiva/sclera: Conjunctivae normal.     Pupils: Pupils are equal, round, and reactive to light.  Cardiovascular:     Rate and Rhythm: Regular rhythm. Tachycardia present.     Pulses: Normal pulses.     Heart sounds: Normal heart sounds.  Pulmonary:     Effort: Pulmonary effort is normal.     Breath sounds: Normal breath sounds.  Abdominal:     General: Abdomen is flat. Bowel sounds are normal. There is no distension.     Palpations: Abdomen is soft.     Tenderness: There is no abdominal tenderness. There is right CVA tenderness. There is no left CVA tenderness, guarding or rebound.  Musculoskeletal:     Right lower leg: No edema.     Left lower leg: No edema.  Skin:    General: Skin is warm and dry.  Neurological:     General: No focal deficit present.     Mental Status: She is alert and oriented to person,  place, and time. Mental status is at baseline.  Psychiatric:        Mood and Affect: Mood normal.        Behavior: Behavior normal.    ED Results / Procedures / Treatments   Labs (all labs ordered are listed, but only abnormal results are displayed) Labs Reviewed  URINALYSIS, ROUTINE W REFLEX MICROSCOPIC - Abnormal; Notable for the following components:      Result Value   Color, Urine AMBER (*)    APPearance CLOUDY (*)    Hgb urine dipstick MODERATE (*)    Protein, ur 30 (*)    Nitrite POSITIVE (*)    Leukocytes,Ua SMALL (*)    WBC, UA >50 (*)    Bacteria, UA RARE (*)    All other components within normal limits  BASIC METABOLIC PANEL - Abnormal; Notable for the following components:   Glucose, Bld 124 (*)    Calcium 8.6 (*)    All other  components within normal limits  CBC - Abnormal; Notable for the following components:   WBC 17.7 (*)    Hemoglobin 11.8 (*)    All other components within normal limits  RESP PANEL BY RT-PCR (FLU A&B, COVID) [id number]  URINE CULTURE  POC URINE PREG, ED  POC URINE PREG, ED    EKG None  Radiology CT ABDOMEN PELVIS W CONTRAST  Result Date: 07/22/2021 CLINICAL DATA:  Right flank pain, concern for kidney stone. EXAM: CT ABDOMEN AND PELVIS WITH CONTRAST TECHNIQUE: Multidetector CT imaging of the abdomen and pelvis was performed using the standard protocol following bolus administration of intravenous contrast. RADIATION DOSE REDUCTION: This exam was performed according to the departmental dose-optimization program which includes automated exposure control, adjustment of the mA and/or kV according to patient size and/or use of iterative reconstruction technique. CONTRAST:  167mL OMNIPAQUE IOHEXOL 300 MG/ML  SOLN COMPARISON:  None. FINDINGS: Lower chest: No acute abnormality. Hepatobiliary: No focal liver abnormality is seen. No gallstones, gallbladder wall thickening, or biliary dilatation. Pancreas: Unremarkable. No pancreatic ductal dilatation or surrounding inflammatory changes. Spleen: Normal in size without focal abnormality. Adrenals/Urinary Tract: Adrenal glands are unremarkable. An obstructing 9 mm calculus is seen in the distal right ureter resulting in moderate right hydroureteronephrosis. There is moderate edema surrounding the right kidney. No calculi are seen in the right kidney. The left kidney is normal, without renal calculi, focal lesion, or hydronephrosis. Bladder is unremarkable. Stomach/Bowel: Stomach is within normal limits. Appendix appears normal. No evidence of bowel wall thickening, distention, or inflammatory changes. Vascular/Lymphatic: No significant vascular findings are present. No enlarged abdominal or pelvic lymph nodes. Reproductive: Uterus and bilateral adnexa are  unremarkable. Other: No abdominal wall hernia or abnormality. No abdominopelvic ascites. 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 IMPRESSION: Obstructing 9 mm distal right ureteral calculus results in moderate right hydroureteronephrosis. Electronically Signed   By: Zerita Boers M.D.   On: 07/22/2021 09:19    Procedures Procedures    Medications Ordered in ED Medications  iohexol (OMNIPAQUE) 300 MG/ML solution 100 mL (has no administration in time range)  fentaNYL (SUBLIMAZE) injection 100 mcg ( Intravenous Not Given 07/22/21 1138)  cefTRIAXone (ROCEPHIN) 2 g in sodium chloride 0.9 % 100 mL IVPB (has no administration in time range)  0.9 %  sodium chloride infusion (has no administration in time range)  tamsulosin (FLOMAX) capsule 0.4 mg (has no administration in time range)  ketorolac (TORADOL) 30 MG/ML injection 30 mg (has no administration in time  range)  fentaNYL (SUBLIMAZE) injection 100 mcg (100 mcg Intravenous Given 07/22/21 0755)  sodium chloride 0.9 % bolus 1,000 mL (0 mLs Intravenous Stopped 07/22/21 1015)  ondansetron (ZOFRAN) injection 4 mg (4 mg Intravenous Given 07/22/21 0844)  iohexol (OMNIPAQUE) 300 MG/ML solution 100 mL (100 mLs Intravenous Contrast Given 07/22/21 0859)  ondansetron (ZOFRAN) injection 4 mg (4 mg Intravenous Given 07/22/21 1034)    ED Course/ Medical Decision Making/ A&P Clinical Course as of 07/22/21 [place]  Wed Jul 22, 2021  0934 Both nitrities and leuks positive, hematuria. Raises concern for pyelo. CBC shows leukocytosis to 17, raises concern for appendicitis. Will obtain CT a/p with contrast. [CM]  0935 CT a/p shows 9 mm nephrolithiasis. S cr WNL at 0.96. In setting of leukocytosis, concern for infected stone, will consult urology. [CM]  1009 Made NPO. Discussed with Dr. Alyson Ingles, urologist oncall who will see her. [CM]  1030 Patient with significant pain again and nausea/emesis. Will give morphine for longer lasting effects, zofran. [CM]   41 Spoke with Dr. Dory Larsen, recommends admission. Plan for surgery tomorrow. [CM]  [id number] Discussed with [facility], they will admit. Plan to tx pyelo with CTX.  [CM]    Clinical Course User Index [CM] Gladys Damme, MD                           Medical Decision Making 26 yo woman presents with colicky right flank pain that radiates to the groin. Patient has a history of nephrolithiasis. Ddx includes nephrolithiasis, pyelonephritis, PID, ovarian torsion, ectopic pregnancy, and appendicitis. Ectopic pregnancy and appendicitis seem less likely given benign abdominal exam. Most likely dx is nephrolithiasis. Will treat pain, get urine pregnancy test, UA, urine culture, CBC and metabolic panel and reassess for imaging.  Amount and/or Complexity of Data Reviewed Labs: ordered. Radiology: ordered.  Risk Prescription drug management. Decision regarding hospitalization.           Final Clinical Impression(s) / ED Diagnoses Final diagnoses:  None    Rx / DC Orders ED Discharge Orders     None         Gladys Damme, MD 07/22/21 1338    Elnora Morrison, MD 07/26/21 [phone]

## 2021-07-22 NOTE — ED Triage Notes (Signed)
Reports right flank pain that started yesterday associated with vomiting.  Reports vomited 3 times.  Burning with urination and foul odor.

## 2021-07-22 NOTE — H&P (View-Only) (Signed)
Urology Consult  Referring physician: Dr. Leary Roca Reason for referral: right ureteral calculus  Chief Complaint: right flank pain  History of Present Illness: Stefanie Farmer is a 26yo who presented to the ER with a 3-4 day history of right flank pain. She has had multiple stone event sin the past but has always been able to pass them. The right flank pain became worse today and was sharp, constant, severe and nonradiaitng. She had associated nausea and vomiting. Sh has associated urinary urgency, frequency, and a feeling of incomplete emptying for the past 2 days. She took multiple doses of AZO yesterday and today. UA shows WBC, RBCs and rare bacteria. CT shows a 13mm right distal ureteral calculus with mild to moderate hydronephrosis. WBC 17.7. No fevers/chills/sweats. No other associated symptoms. No exacerbating events. Pain was alleviated with narcotics.   History reviewed. No pertinent past medical history. Past Surgical History:  Procedure Laterality Date   FOOT SURGERY     ORIF ANKLE FRACTURE Right 06/24/2020   Procedure: OPEN REDUCTION INTERNAL FIXATION (ORIF) ANKLE FRACTURE;  Surgeon: Vickki Hearing, MD;  Location: [facility];  Service: Orthopedics;  Laterality: Right;    Medications: I have reviewed the patient's current medications. Allergies: No Known Allergies  Family History  Family history unknown: Yes   Social History:  reports that she has quit smoking. Her smoking use included cigarettes. She has never used smokeless tobacco. She reports current alcohol use. She reports current drug use. Drug: Marijuana.  Review of Systems  Genitourinary:  Positive for flank pain.  All other systems reviewed and are negative.  Physical Exam:  Vital signs in last 24 hours: Temp:  [98 F (36.7 C)-99.8 F (37.7 C)] 99.3 F (37.4 C) (02/22 1442) Pulse Rate:  [94-125] 97 (02/22 1442) Resp:  [18-20] 18 (02/22 1442) BP: (96-133)/(49-73) 123/61 (02/22 1442) SpO2:  [94 %-100 %] 98 % (02/22  1442) Weight:  [127 kg-130 kg] 130 kg (02/22 1442) Physical Exam Constitutional:      Appearance: Normal appearance.  HENT:     Head: Normocephalic and atraumatic.     Mouth/Throat:     Mouth: Mucous membranes are dry.  Eyes:     Extraocular Movements: Extraocular movements intact.     Pupils: Pupils are equal, round, and reactive to light.  Cardiovascular:     Rate and Rhythm: Normal rate and regular rhythm.  Pulmonary:     Effort: Pulmonary effort is normal. No respiratory distress.  Abdominal:     General: Abdomen is flat. There is no distension.  Musculoskeletal:     Cervical back: Normal range of motion and neck supple.  Skin:    General: Skin is warm and dry.  Neurological:     General: No focal deficit present.     Mental Status: She is alert and oriented to person, place, and time.  Psychiatric:        Mood and Affect: Mood normal.        Behavior: Behavior normal.        Thought Content: Thought content normal.        Judgment: Judgment normal.    Laboratory Data:  Results for orders placed or performed during the hospital encounter of 07/22/21 (from the past 72 hour(s))  Urinalysis, Routine w reflex microscopic Urine, Clean Catch     Status: Abnormal   Collection Time: 07/22/21  7:36 AM  Result Value Ref Range   Color, Urine AMBER (A) YELLOW    Comment: BIOCHEMICALS MAY BE  AFFECTED BY COLOR   APPearance CLOUDY (A) CLEAR   Specific Gravity, Urine 1.023 1.005 - 1.030   pH 5.0 5.0 - 8.0   Glucose, UA NEGATIVE NEGATIVE mg/dL   Hgb urine dipstick MODERATE (A) NEGATIVE   Bilirubin Urine NEGATIVE NEGATIVE   Ketones, ur NEGATIVE NEGATIVE mg/dL   Protein, ur 30 (A) NEGATIVE mg/dL   Nitrite POSITIVE (A) NEGATIVE   Leukocytes,Ua SMALL (A) NEGATIVE   RBC / HPF 21-50 0 - 5 RBC/hpf   WBC, UA >50 (H) 0 - 5 WBC/hpf   Bacteria, UA RARE (A) NONE SEEN   Squamous Epithelial / LPF 6-10 0 - 5   WBC Clumps PRESENT    Mucus PRESENT    Budding Yeast PRESENT     Comment:  Performed at [facility], [address] [place], [place] [id number]  Basic metabolic panel     Status: Abnormal   Collection Time: 07/22/21  7:42 AM  Result Value Ref Range   Sodium 135 135 - 145 mmol/L   Potassium 4.0 3.5 - 5.1 mmol/L   Chloride 104 98 - 111 mmol/L   CO2 23 22 - 32 mmol/L   Glucose, Bld 124 (H) 70 - 99 mg/dL    Comment: Glucose reference range applies only to samples taken after fasting for at least 8 hours.   BUN 11 6 - 20 mg/dL   Creatinine, Ser 0.96 0.44 - 1.00 mg/dL   Calcium 8.6 (L) 8.9 - 10.3 mg/dL   GFR, Estimated >60 >60 mL/min    Comment: (NOTE) Calculated using the CKD-EPI Creatinine Equation (2021)    Anion gap 8 5 - 15    Comment: Performed at [facility], [address] [place], [place] [address]  CBC     Status: Abnormal   Collection Time: 07/22/21  7:42 AM  Result Value Ref Range   WBC 17.7 (H) 4.0 - 10.5 K/uL   RBC 4.14 3.87 - 5.11 MIL/uL   Hemoglobin 11.8 (L) 12.0 - 15.0 g/dL   HCT 37.3 36.0 - 46.0 %   MCV 90.1 80.0 - 100.0 fL   MCH 28.5 26.0 - 34.0 pg   MCHC 31.6 30.0 - 36.0 g/dL   RDW 13.4 11.5 - 15.5 %   Platelets 293 150 - 400 K/uL   nRBC 0.0 0.0 - 0.2 %    Comment: Performed at [facility], [address] [place], [place] [address]  POC urine preg, ED     Status: None   Collection Time: 07/22/21  7:51 AM  Result Value Ref Range   Preg Test, Ur NEGATIVE NEGATIVE    Comment:        THE SENSITIVITY OF THIS METHODOLOGY IS >24 mIU/mL   Resp Panel by RT-PCR (Flu A&B, Covid) Nasopharyngeal Swab     Status: None   Collection Time: 07/22/21 10:10 AM   Specimen: Nasopharyngeal Swab; Nasopharyngeal(NP) swabs in vial transport medium  Result Value Ref Range   SARS Coronavirus 2 by RT PCR NEGATIVE NEGATIVE    Comment: (NOTE) SARS-CoV-2 target nucleic acids are NOT DETECTED.  The SARS-CoV-2 RNA is generally detectable in upper respiratory specimens during the acute phase of infection. The lowest concentration of SARS-CoV-2 viral copies  this assay can detect is 138 copies/mL. A negative result does not preclude SARS-Cov-2 infection and should not be used as the sole basis for treatment or other patient management decisions. A negative result may occur with  improper specimen collection/handling, submission of specimen other than nasopharyngeal swab,  presence of viral mutation(s) within the areas targeted by this assay, and inadequate number of viral copies(<138 copies/mL). A negative result must be combined with clinical observations, patient history, and epidemiological information. The expected result is Negative.  Fact Sheet for Patients:  EntrepreneurPulse.com.au  Fact Sheet for Healthcare Providers:  IncredibleEmployment.be  This test is no t yet approved or cleared by the [place] FDA and  has been authorized for detection and/or diagnosis of SARS-CoV-2 by FDA under an Emergency Use Authorization (EUA). This EUA will remain  in effect (meaning this test can be used) for the duration of the COVID-19 declaration under Section 564(b)(1) of the Act, 21 U.S.C.section 360bbb-3(b)(1), unless the authorization is terminated  or revoked sooner.       Influenza A by PCR NEGATIVE NEGATIVE   Influenza B by PCR NEGATIVE NEGATIVE    Comment: (NOTE) The Xpert Xpress SARS-CoV-2/FLU/RSV plus assay is intended as an aid in the diagnosis of influenza from Nasopharyngeal swab specimens and should not be used as a sole basis for treatment. Nasal washings and aspirates are unacceptable for Xpert Xpress SARS-CoV-2/FLU/RSV testing.  Fact Sheet for Patients: EntrepreneurPulse.com.au  Fact Sheet for Healthcare Providers: IncredibleEmployment.be  This test is not yet approved or cleared by the [place] FDA and has been authorized for detection and/or diagnosis of SARS-CoV-2 by FDA under an Emergency Use Authorization (EUA). This EUA will  remain in effect (meaning this test can be used) for the duration of the COVID-19 declaration under Section 564(b)(1) of the Act, 21 U.S.C. section 360bbb-3(b)(1), unless the authorization is terminated or revoked.  Performed at [facility], [address] [place], [place] [address]   Lactic acid, plasma     Status: Abnormal   Collection Time: 07/22/21  1:56 PM  Result Value Ref Range   Lactic Acid, Venous 2.0 (HH) 0.5 - 1.9 mmol/L    Comment: CRITICAL RESULT CALLED TO, READ BACK BY AND VERIFIED WITH: CANTER,A ON 07/22/21 AT 1515 BY LOY,C Performed at [facility], [address], [place],  [address]    Recent Results (from the past 240 hour(s))  Resp Panel by RT-PCR (Flu A&B, Covid) Nasopharyngeal Swab     Status: None   Collection Time: 07/22/21 10:10 AM   Specimen: Nasopharyngeal Swab; Nasopharyngeal(NP) swabs in vial transport medium  Result Value Ref Range Status   SARS Coronavirus 2 by RT PCR NEGATIVE NEGATIVE Final    Comment: (NOTE) SARS-CoV-2 target nucleic acids are NOT DETECTED.  The SARS-CoV-2 RNA is generally detectable in upper respiratory specimens during the acute phase of infection. The lowest concentration of SARS-CoV-2 viral copies this assay can detect is 138 copies/mL. A negative result does not preclude SARS-Cov-2 infection and should not be used as the sole basis for treatment or other patient management decisions. A negative result may occur with  improper specimen collection/handling, submission of specimen other than nasopharyngeal swab, presence of viral mutation(s) within the areas targeted by this assay, and inadequate number of viral copies(<138 copies/mL). A negative result must be combined with clinical observations, patient history, and epidemiological information. The expected result is Negative.  Fact Sheet for Patients:  EntrepreneurPulse.com.au  Fact Sheet for Healthcare Providers:   IncredibleEmployment.be  This test is no t yet approved or cleared by the [place] FDA and  has been authorized for detection and/or diagnosis of SARS-CoV-2 by FDA under an Emergency Use Authorization (EUA). This EUA will remain  in effect (meaning this test can be used) for the duration  of the COVID-19 declaration under Section 564(b)(1) of the Act, 21 U.S.C.section 360bbb-3(b)(1), unless the authorization is terminated  or revoked sooner.       Influenza A by PCR NEGATIVE NEGATIVE Final   Influenza B by PCR NEGATIVE NEGATIVE Final    Comment: (NOTE) The Xpert Xpress SARS-CoV-2/FLU/RSV plus assay is intended as an aid in the diagnosis of influenza from Nasopharyngeal swab specimens and should not be used as a sole basis for treatment. Nasal washings and aspirates are unacceptable for Xpert Xpress SARS-CoV-2/FLU/RSV testing.  Fact Sheet for Patients: EntrepreneurPulse.com.au  Fact Sheet for Healthcare Providers: IncredibleEmployment.be  This test is not yet approved or cleared by the [place] FDA and has been authorized for detection and/or diagnosis of SARS-CoV-2 by FDA under an Emergency Use Authorization (EUA). This EUA will remain in effect (meaning this test can be used) for the duration of the COVID-19 declaration under Section 564(b)(1) of the Act, 21 U.S.C. section 360bbb-3(b)(1), unless the authorization is terminated or revoked.  Performed at [facility], [address], [place], [place] [address]    Creatinine: Recent Labs    07/22/21 [id number]  CREATININE 0.96   Baseline Creatinine: 0.96  Impression/Assessment:  25yo with right ureteral calculus  Plan:  -We discussed the management of kidney stones. These options include observation, ureteroscopy, shockwave lithotripsy (ESWL) and percutaneous nephrolithotomy (PCNL). We discussed which options are relevant to the patient's stone(s). We discussed  the natural history of kidney stones as well as the complications of untreated stones and the impact on quality of life without treatment as well as with each of the above listed treatments. We also discussed the efficacy of each treatment in its ability to clear the stone burden. With any of these management options I discussed the signs and symptoms of infection and the need for emergent treatment should these be experienced. For each option we discussed the ability of each procedure to clear the patient of their stone burden.   For observation I described the risks which include but are not limited to silent renal damage, life-threatening infection, need for emergent surgery, failure to pass stone and pain.   For ureteroscopy I described the risks which include bleeding, infection, damage to contiguous structures, positioning injury, ureteral stricture, ureteral avulsion, ureteral injury, need for prolonged ureteral stent, inability to perform ureteroscopy, need for an interval procedure, inability to clear stone burden, stent discomfort/pain, heart attack, stroke, pulmonary embolus and the inherent risks with general anesthesia.   For shockwave lithotripsy I described the risks which include arrhythmia, kidney contusion, kidney hemorrhage, need for transfusion, pain, inability to adequately break up stone, inability to pass stone fragments, Steinstrasse, infection associated with obstructing stones, need for alternate surgical procedure, need for repeat shockwave lithotripsy, MI, CVA, PE and the inherent risks with anesthesia/conscious sedation.   For PCNL I described the risks including positioning injury, pneumothorax, hydrothorax, need for chest tube, inability to clear stone burden, renal laceration, arterial venous fistula or malformation, need for embolization of kidney, loss of kidney or renal function, need for repeat procedure, need for prolonged nephrostomy tube, ureteral avulsion, MI, CVA, PE  and the inherent risks of general anesthesia.   - The patient would like to proceed with right ureteroscopic stone extraction/right ureteral stent placement. Please continue rocephin. Please make NPO after midnight for surgery tomorrow morning.   Nicolette Bang 07/22/2021, 4:51 PM

## 2021-07-22 NOTE — ED Notes (Signed)
Patient moaning in pain.  Provider notified and new orders placed.

## 2021-07-22 NOTE — H&P (Signed)
Patient Demographics:    Stefanie Farmer, is a 26 y.o. female  MRN: [id number]   DOB - [birth year]  Admit Date - 07/22/2021  Outpatient Primary MD for the patient is Patient, No Pcp Per (Inactive)   Assessment & Plan:   Assessment and Plan:  1) right-sided nephrolithiasis--suspect infected stone CT [facility] shows Obstructing 9 mm distal right ureteral calculus results in moderate right  hydroureteronephrosis. -Urology consult appreciated, plan for possible intervention patient remains afebrile on 07/23/2021 -IV Toradol and IV fentanyl as needed  2) sepsis secondary to complicated acute UTI--POA history of pansensitive E. coli on June 15, 2021 -Patient meets sepsis criteria on admission with tachycardia, tachypnea, leukocytosis and complicated UTI -WBC 17.7 -Appears to have UTI in the setting of right-sided nephrolithiasis --Lactic acid is 2.0-- IV fluids  ordered repeat lactic acid pending -IV Rocephin as ordered -IV fluids -Urology consult for intervention  3)Morbid Obesity- -Low calorie diet, portion control and increase physical activity discussed with patient -Body mass index is 50.77 kg/m.   Disposition/Need for in-Hospital Stay- patient unable to be discharged at this time due to --sepsis due to complicated UTI with infected stone requiring IV antibiotics and IV fluids, pending urology evaluation/intervention*  Status is: Inpatient  Remains inpatient appropriate because:   Dispo: The patient is from: Home              Anticipated d/c is to: Home              Anticipated d/c date is: 2 days              Patient currently is not medically stable to d/c. Barriers: Not Clinically Stable-    With History of - Reviewed by me  History reviewed. No pertinent past medical history.    Past Surgical  History:  Procedure Laterality Date   FOOT SURGERY     ORIF ANKLE FRACTURE Right 06/24/2020   Procedure: OPEN REDUCTION INTERNAL FIXATION (ORIF) ANKLE FRACTURE;  Surgeon: Vickki Hearing, MD;  Location: [facility];  Service: Orthopedics;  Laterality: Right;      Chief Complaint  Patient presents with   Flank Pain      HPI:    Stefanie Farmer  is a 26 y.o. female with morbid obesity and recent E. coli UTI from culture dated 06/15/2021 presents with right flank pain since 07/20/2021 radiating to her groin associated with nausea vomiting and dysuria with urinary frequency -Additional history obtained from patient's boyfriend at bedside -Patient had chills but no documented fevers -In the ED respiratory rate and heart rate are up WBC 17.7 -Creatinine 0.96- CT abdomen and pelvis shows obstructing 9 mm distal right ureteral calculus results in moderate  right hydroureteronephrosis. -Urology input appreciated plans for possible intervention on 07/23/2021 if patient is afebrile -Lactic acid is 2.0 IV fluids and antibiotics ordered repeat lactic acid pending -UA suggestive of UTI patient also has yeast    Review of systems:  In addition to the HPI above,   A full Review of  Systems was done, all other systems reviewed are negative except as noted above in HPI , .    Social History:  Reviewed by me    Social History   Tobacco Use   Smoking status: Former    Types: Cigarettes   Smokeless tobacco: Never  Substance Use Topics   Alcohol use: Yes    Family History :  Reviewed by me    Family History  Family history unknown: Yes     Home Medications:   Prior to Admission medications   Medication Sig Start Date End Date Taking? Authorizing Provider  ciprofloxacin (CIPRO) 500 MG tablet Take 1 tablet (500 mg total) by mouth 2 (two) times daily. Patient not taking: Reported on 07/22/2021 06/15/21   Wallis Bamberg, PA-C  ibuprofen (ADVIL) 800 MG tablet Take 1 tablet (800 mg total) by  mouth every 8 (eight) hours as needed. Patient not taking: Reported on 07/22/2021 07/02/20   Vickki Hearing, MD     Allergies:    No Known Allergies   Physical Exam:   Vitals  Blood pressure 123/61, pulse 97, temperature 99.3 F (37.4 C), temperature source Oral, resp. rate 18, height 5\' 3"  (1.6 m), weight 130 kg, SpO2 98 %.  Physical Examination: General appearance - alert, morbidly obese and in no distress  Mental status - alert, oriented to person, place, and time,  Eyes - sclera anicteric Neck - supple, no JVD elevation , Chest - clear  to auscultation bilaterally, symmetrical air movement,  Heart - S1 and S2 normal, regular  Abdomen - soft, nondistended, increased truncal adiposity noted, patient has right flank tenderness, right CVA area tenderness, Neurological - screening mental status exam normal, neck supple without rigidity, cranial nerves II through XII intact, DTR's normal and symmetric Extremities - no pedal edema noted, intact peripheral pulses  Skin - warm, dry   Data Review:    CBC Recent Labs  Lab 07/22/21 0742  WBC 17.7*  HGB 11.8*  HCT 37.3  PLT 293  MCV 90.1  MCH 28.5  MCHC 31.6  RDW 13.4   ------------------------------------------------------------------------------------------------------------------  Chemistries  Recent Labs  Lab 07/22/21 0742  NA 135  K 4.0  CL 104  CO2 23  GLUCOSE 124*  BUN 11  CREATININE 0.96  CALCIUM 8.6*   ------------------------------------------------------------------------------------------------------------------ estimated creatinine clearance is 120 mL/min (by C-G formula based on SCr of 0.96 mg/dL). ------------------------------------------------------------------------------------------------------------------ No results for input(s): TSH, T4TOTAL, T3FREE, THYROIDAB in the last 72 hours.  Invalid input(s): FREET3   Urinalysis    Component Value Date/Time   COLORURINE AMBER (A) 07/22/2021 0736    APPEARANCEUR CLOUDY (A) 07/22/2021 0736   LABSPEC 1.023 07/22/2021 0736   PHURINE 5.0 07/22/2021 0736   GLUCOSEU NEGATIVE 07/22/2021 0736   HGBUR MODERATE (A) 07/22/2021 0736   BILIRUBINUR NEGATIVE 07/22/2021 0736   BILIRUBINUR negative 06/15/2021 1331   KETONESUR NEGATIVE 07/22/2021 0736   PROTEINUR 30 (A) 07/22/2021 0736   UROBILINOGEN 0.2 06/15/2021 1331   NITRITE POSITIVE (A) 07/22/2021 0736   LEUKOCYTESUR SMALL (A) 07/22/2021 0736    ----------------------------------------------------------------------------------------------------------------   Imaging Results:    CT ABDOMEN PELVIS W CONTRAST  Result Date: 07/22/2021 CLINICAL DATA:  Right flank pain, concern for kidney stone. EXAM: CT ABDOMEN AND PELVIS WITH CONTRAST TECHNIQUE: Multidetector CT imaging of the abdomen and pelvis was performed using the standard protocol following bolus administration of intravenous contrast. RADIATION DOSE REDUCTION: This exam was  performed according to the departmental dose-optimization program which includes automated exposure control, adjustment of the mA and/or kV according to patient size and/or use of iterative reconstruction technique. CONTRAST:  OMNIPAQUE IOHEXOL 300 MG/ML  SOLN COMPARISON:  None. FINDINGS: Lower chest: No acute abnormality. Hepatobiliary: No focal liver abnormality is seen. No gallstones, gallbladder wall thickening, or biliary dilatation. Pancreas: Unremarkable. No pancreatic ductal dilatation or surrounding inflammatory changes. Spleen: Normal in size without focal abnormality. Adrenals/Urinary Tract: Adrenal glands are unremarkable. An obstructing 9 mm calculus is seen in the distal right ureter resulting in moderate right hydroureteronephrosis. There is moderate edema surrounding the right kidney. No calculi are seen in the right kidney. The left kidney is normal, without renal calculi, focal lesion, or hydronephrosis. Bladder is unremarkable. Stomach/Bowel: Stomach is  within normal limits. Appendix appears normal. No evidence of bowel wall thickening, distention, or inflammatory changes. Vascular/Lymphatic: No significant vascular findings are present. No enlarged abdominal or pelvic lymph nodes. Reproductive: Uterus and bilateral adnexa are unremarkable. Other: No abdominal wall hernia or abnormality. No abdominopelvic ascites. 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 IMPRESSION: Obstructing 9 mm distal right ureteral calculus results in moderate right hydroureteronephrosis. Electronically Signed   By: Romona Curls M.D.   On: 07/22/2021 09:19    Radiological Exams on Admission: CT ABDOMEN PELVIS W CONTRAST  Result Date: 07/22/2021 CLINICAL DATA:  Right flank pain, concern for kidney stone. EXAM: CT ABDOMEN AND PELVIS WITH CONTRAST TECHNIQUE: Multidetector CT imaging of the abdomen and pelvis was performed using the standard protocol following bolus administration of intravenous contrast. RADIATION DOSE REDUCTION: This exam was performed according to the departmental dose-optimization program which includes automated exposure control, adjustment of the mA and/or kV according to patient size and/or use of iterative reconstruction technique. CONTRAST:  OMNIPAQUE IOHEXOL 300 MG/ML  SOLN COMPARISON:  None. FINDINGS: Lower chest: No acute abnormality. Hepatobiliary: No focal liver abnormality is seen. No gallstones, gallbladder wall thickening, or biliary dilatation. Pancreas: Unremarkable. No pancreatic ductal dilatation or surrounding inflammatory changes. Spleen: Normal in size without focal abnormality. Adrenals/Urinary Tract: Adrenal glands are unremarkable. An obstructing 9 mm calculus is seen in the distal right ureter resulting in moderate right hydroureteronephrosis. There is moderate edema surrounding the right kidney. No calculi are seen in the right kidney. The left kidney is normal, without renal calculi, focal lesion, or hydronephrosis.  Bladder is unremarkable. Stomach/Bowel: Stomach is within normal limits. Appendix appears normal. No evidence of bowel wall thickening, distention, or inflammatory changes. Vascular/Lymphatic: No significant vascular findings are present. No enlarged abdominal or pelvic lymph nodes. Reproductive: Uterus and bilateral adnexa are unremarkable. Other: No abdominal wall hernia or abnormality. No abdominopelvic ascites. 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 IMPRESSION: Obstructing 9 mm distal right ureteral calculus results in moderate right hydroureteronephrosis. Electronically Signed   By: Romona Curls M.D.   On: 07/22/2021 09:19    DVT Prophylaxis -SCD/heparin AM Labs Ordered, also please review Full Orders  Family Communication: Admission, patients condition and plan of care including tests being ordered have been discussed with the patient and boyfriend at bedside who indicate understanding and agree with the plan   Code Status - Full Code  Likely DC to home after resolution of sepsis pathophysiology  Condition   stable,  Shon Hale M.D on 07/22/2021 at 4:55 PM Go to [email or web address] -  for contact info  [facility] - Office  [phone]

## 2021-07-22 NOTE — Consult Note (Signed)
Urology Consult  °Referring physician: Dr. Mahoney °Reason for referral: right ureteral calculus ° °Chief Complaint: right flank pain ° °History of Present Illness: Stefanie Farmer is a 26yo who presented to the ER with a 3-4 day history of right flank pain. She has had multiple stone event sin the past but has always been able to pass them. The right flank pain became worse today and was sharp, constant, severe and nonradiaitng. She had associated nausea and vomiting. Sh has associated urinary urgency, frequency, and a feeling of incomplete emptying for the past 2 days. She took multiple doses of AZO yesterday and today. UA shows WBC, RBCs and rare bacteria. CT shows a 9mm right distal ureteral calculus with mild to moderate hydronephrosis. WBC 17.7. No fevers/chills/sweats. No other associated symptoms. No exacerbating events. Pain was alleviated with narcotics.  ° °History reviewed. No pertinent past medical history. °Past Surgical History:  °Procedure Laterality Date  ° FOOT SURGERY    ° ORIF ANKLE FRACTURE Right 06/24/2020  ° Procedure: OPEN REDUCTION INTERNAL FIXATION (ORIF) ANKLE FRACTURE;  Surgeon: Harrison, Stanley E, MD;  Location: [facility];  Service: Orthopedics;  Laterality: Right;  ° ° °Medications: I have reviewed the patient's current medications. °Allergies: No Known Allergies ° °Family History  °Family history unknown: Yes  ° °Social History:  reports that she has quit smoking. Her smoking use included cigarettes. She has never used smokeless tobacco. She reports current alcohol use. She reports current drug use. Drug: Marijuana. ° °Review of Systems  °Genitourinary:  Positive for flank pain.  °All other systems reviewed and are negative. ° °Physical Exam:  °Vital signs in last 24 hours: °Temp:  [98 °F (36.7 °C)-99.8 °F (37.7 °C)] 99.3 °F (37.4 °C) (02/22 1442) °Pulse Rate:  [94-125] 97 (02/22 1442) °Resp:  [18-20] 18 (02/22 1442) °BP: (96-133)/(49-73) 123/61 (02/22 1442) °SpO2:  [94 %-100 %] 98 % (02/22  1442) °Weight:  [127 kg-130 kg] 130 kg (02/22 1442) °Physical Exam °Constitutional:   °   Appearance: Normal appearance.  °HENT:  °   Head: Normocephalic and atraumatic.  °   Mouth/Throat:  °   Mouth: Mucous membranes are dry.  °Eyes:  °   Extraocular Movements: Extraocular movements intact.  °   Pupils: Pupils are equal, round, and reactive to light.  °Cardiovascular:  °   Rate and Rhythm: Normal rate and regular rhythm.  °Pulmonary:  °   Effort: Pulmonary effort is normal. No respiratory distress.  °Abdominal:  °   General: Abdomen is flat. There is no distension.  °Musculoskeletal:  °   Cervical back: Normal range of motion and neck supple.  °Skin: °   General: Skin is warm and dry.  °Neurological:  °   General: No focal deficit present.  °   Mental Status: She is alert and oriented to person, place, and time.  °Psychiatric:     °   Mood and Affect: Mood normal.     °   Behavior: Behavior normal.     °   Thought Content: Thought content normal.     °   Judgment: Judgment normal.  ° ° °Laboratory Data:  °Results for orders placed or performed during the hospital encounter of 07/22/21 (from the past 72 hour(s))  °Urinalysis, Routine w reflex microscopic Urine, Clean Catch     Status: Abnormal  ° Collection Time: 07/22/21  7:36 AM  °Result Value Ref Range  ° Color, Urine AMBER (A) YELLOW  °  Comment: BIOCHEMICALS MAY BE   AFFECTED BY COLOR   APPearance CLOUDY (A) CLEAR   Specific Gravity, Urine 1.023 1.005 - 1.030   pH 5.0 5.0 - 8.0   Glucose, UA NEGATIVE NEGATIVE mg/dL   Hgb urine dipstick MODERATE (A) NEGATIVE   Bilirubin Urine NEGATIVE NEGATIVE   Ketones, ur NEGATIVE NEGATIVE mg/dL   Protein, ur 30 (A) NEGATIVE mg/dL   Nitrite POSITIVE (A) NEGATIVE   Leukocytes,Ua SMALL (A) NEGATIVE   RBC / HPF 21-50 0 - 5 RBC/hpf   WBC, UA >50 (H) 0 - 5 WBC/hpf   Bacteria, UA RARE (A) NONE SEEN   Squamous Epithelial / LPF 6-10 0 - 5   WBC Clumps PRESENT    Mucus PRESENT    Budding Yeast PRESENT     Comment:  Performed at [facility], [address] [place], [place] [id number]  Basic metabolic panel     Status: Abnormal   Collection Time: 07/22/21  7:42 AM  Result Value Ref Range   Sodium 135 135 - 145 mmol/L   Potassium 4.0 3.5 - 5.1 mmol/L   Chloride 104 98 - 111 mmol/L   CO2 23 22 - 32 mmol/L   Glucose, Bld 124 (H) 70 - 99 mg/dL    Comment: Glucose reference range applies only to samples taken after fasting for at least 8 hours.   BUN 11 6 - 20 mg/dL   Creatinine, Ser 0.96 0.44 - 1.00 mg/dL   Calcium 8.6 (L) 8.9 - 10.3 mg/dL   GFR, Estimated >60 >60 mL/min    Comment: (NOTE) Calculated using the CKD-EPI Creatinine Equation (2021)    Anion gap 8 5 - 15    Comment: Performed at [facility], [address] [place], [place] [address]  CBC     Status: Abnormal   Collection Time: 07/22/21  7:42 AM  Result Value Ref Range   WBC 17.7 (H) 4.0 - 10.5 K/uL   RBC 4.14 3.87 - 5.11 MIL/uL   Hemoglobin 11.8 (L) 12.0 - 15.0 g/dL   HCT 37.3 36.0 - 46.0 %   MCV 90.1 80.0 - 100.0 fL   MCH 28.5 26.0 - 34.0 pg   MCHC 31.6 30.0 - 36.0 g/dL   RDW 13.4 11.5 - 15.5 %   Platelets 293 150 - 400 K/uL   nRBC 0.0 0.0 - 0.2 %    Comment: Performed at [facility], [address] [place], [place] [address]  POC urine preg, ED     Status: None   Collection Time: 07/22/21  7:51 AM  Result Value Ref Range   Preg Test, Ur NEGATIVE NEGATIVE    Comment:        THE SENSITIVITY OF THIS METHODOLOGY IS >24 mIU/mL   Resp Panel by RT-PCR (Flu A&B, Covid) Nasopharyngeal Swab     Status: None   Collection Time: 07/22/21 10:10 AM   Specimen: Nasopharyngeal Swab; Nasopharyngeal(NP) swabs in vial transport medium  Result Value Ref Range   SARS Coronavirus 2 by RT PCR NEGATIVE NEGATIVE    Comment: (NOTE) SARS-CoV-2 target nucleic acids are NOT DETECTED.  The SARS-CoV-2 RNA is generally detectable in upper respiratory specimens during the acute phase of infection. The lowest concentration of SARS-CoV-2 viral copies  this assay can detect is 138 copies/mL. A negative result does not preclude SARS-Cov-2 infection and should not be used as the sole basis for treatment or other patient management decisions. A negative result may occur with  improper specimen collection/handling, submission of specimen other than nasopharyngeal swab,  presence of viral mutation(s) within the areas targeted by this assay, and inadequate number of viral copies(<138 copies/mL). A negative result must be combined with clinical observations, patient history, and epidemiological information. The expected result is Negative.  Fact Sheet for Patients:  EntrepreneurPulse.com.au  Fact Sheet for Healthcare Providers:  IncredibleEmployment.be  This test is no t yet approved or cleared by the [place] FDA and  has been authorized for detection and/or diagnosis of SARS-CoV-2 by FDA under an Emergency Use Authorization (EUA). This EUA will remain  in effect (meaning this test can be used) for the duration of the COVID-19 declaration under Section 564(b)(1) of the Act, 21 U.S.C.section 360bbb-3(b)(1), unless the authorization is terminated  or revoked sooner.       Influenza A by PCR NEGATIVE NEGATIVE   Influenza B by PCR NEGATIVE NEGATIVE    Comment: (NOTE) The Xpert Xpress SARS-CoV-2/FLU/RSV plus assay is intended as an aid in the diagnosis of influenza from Nasopharyngeal swab specimens and should not be used as a sole basis for treatment. Nasal washings and aspirates are unacceptable for Xpert Xpress SARS-CoV-2/FLU/RSV testing.  Fact Sheet for Patients: EntrepreneurPulse.com.au  Fact Sheet for Healthcare Providers: IncredibleEmployment.be  This test is not yet approved or cleared by the [place] FDA and has been authorized for detection and/or diagnosis of SARS-CoV-2 by FDA under an Emergency Use Authorization (EUA). This EUA will  remain in effect (meaning this test can be used) for the duration of the COVID-19 declaration under Section 564(b)(1) of the Act, 21 U.S.C. section 360bbb-3(b)(1), unless the authorization is terminated or revoked.  Performed at [facility], [address] [place], [place] [address]   Lactic acid, plasma     Status: Abnormal   Collection Time: 07/22/21  1:56 PM  Result Value Ref Range   Lactic Acid, Venous 2.0 (HH) 0.5 - 1.9 mmol/L    Comment: CRITICAL RESULT CALLED TO, READ BACK BY AND VERIFIED WITH: CANTER,A ON 07/22/21 AT 1515 BY LOY,C Performed at [facility], [address], [place],  [address]    Recent Results (from the past 240 hour(s))  Resp Panel by RT-PCR (Flu A&B, Covid) Nasopharyngeal Swab     Status: None   Collection Time: 07/22/21 10:10 AM   Specimen: Nasopharyngeal Swab; Nasopharyngeal(NP) swabs in vial transport medium  Result Value Ref Range Status   SARS Coronavirus 2 by RT PCR NEGATIVE NEGATIVE Final    Comment: (NOTE) SARS-CoV-2 target nucleic acids are NOT DETECTED.  The SARS-CoV-2 RNA is generally detectable in upper respiratory specimens during the acute phase of infection. The lowest concentration of SARS-CoV-2 viral copies this assay can detect is 138 copies/mL. A negative result does not preclude SARS-Cov-2 infection and should not be used as the sole basis for treatment or other patient management decisions. A negative result may occur with  improper specimen collection/handling, submission of specimen other than nasopharyngeal swab, presence of viral mutation(s) within the areas targeted by this assay, and inadequate number of viral copies(<138 copies/mL). A negative result must be combined with clinical observations, patient history, and epidemiological information. The expected result is Negative.  Fact Sheet for Patients:  EntrepreneurPulse.com.au  Fact Sheet for Healthcare Providers:   IncredibleEmployment.be  This test is no t yet approved or cleared by the [place] FDA and  has been authorized for detection and/or diagnosis of SARS-CoV-2 by FDA under an Emergency Use Authorization (EUA). This EUA will remain  in effect (meaning this test can be used) for the duration  of the COVID-19 declaration under Section 564(b)(1) of the Act, 21 U.S.C.section 360bbb-3(b)(1), unless the authorization is terminated  or revoked sooner.       Influenza A by PCR NEGATIVE NEGATIVE Final   Influenza B by PCR NEGATIVE NEGATIVE Final    Comment: (NOTE) The Xpert Xpress SARS-CoV-2/FLU/RSV plus assay is intended as an aid in the diagnosis of influenza from Nasopharyngeal swab specimens and should not be used as a sole basis for treatment. Nasal washings and aspirates are unacceptable for Xpert Xpress SARS-CoV-2/FLU/RSV testing.  Fact Sheet for Patients: EntrepreneurPulse.com.au  Fact Sheet for Healthcare Providers: IncredibleEmployment.be  This test is not yet approved or cleared by the [place] FDA and has been authorized for detection and/or diagnosis of SARS-CoV-2 by FDA under an Emergency Use Authorization (EUA). This EUA will remain in effect (meaning this test can be used) for the duration of the COVID-19 declaration under Section 564(b)(1) of the Act, 21 U.S.C. section 360bbb-3(b)(1), unless the authorization is terminated or revoked.  Performed at [facility], [address], [place], [place] [address]    Creatinine: Recent Labs    07/22/21 [id number]  CREATININE 0.96   Baseline Creatinine: 0.96  Impression/Assessment:  25yo with right ureteral calculus  Plan:  -We discussed the management of kidney stones. These options include observation, ureteroscopy, shockwave lithotripsy (ESWL) and percutaneous nephrolithotomy (PCNL). We discussed which options are relevant to the patient's stone(s). We discussed  the natural history of kidney stones as well as the complications of untreated stones and the impact on quality of life without treatment as well as with each of the above listed treatments. We also discussed the efficacy of each treatment in its ability to clear the stone burden. With any of these management options I discussed the signs and symptoms of infection and the need for emergent treatment should these be experienced. For each option we discussed the ability of each procedure to clear the patient of their stone burden.   For observation I described the risks which include but are not limited to silent renal damage, life-threatening infection, need for emergent surgery, failure to pass stone and pain.   For ureteroscopy I described the risks which include bleeding, infection, damage to contiguous structures, positioning injury, ureteral stricture, ureteral avulsion, ureteral injury, need for prolonged ureteral stent, inability to perform ureteroscopy, need for an interval procedure, inability to clear stone burden, stent discomfort/pain, heart attack, stroke, pulmonary embolus and the inherent risks with general anesthesia.   For shockwave lithotripsy I described the risks which include arrhythmia, kidney contusion, kidney hemorrhage, need for transfusion, pain, inability to adequately break up stone, inability to pass stone fragments, Steinstrasse, infection associated with obstructing stones, need for alternate surgical procedure, need for repeat shockwave lithotripsy, MI, CVA, PE and the inherent risks with anesthesia/conscious sedation.   For PCNL I described the risks including positioning injury, pneumothorax, hydrothorax, need for chest tube, inability to clear stone burden, renal laceration, arterial venous fistula or malformation, need for embolization of kidney, loss of kidney or renal function, need for repeat procedure, need for prolonged nephrostomy tube, ureteral avulsion, MI, CVA, PE  and the inherent risks of general anesthesia.   - The patient would like to proceed with right ureteroscopic stone extraction/right ureteral stent placement. Please continue rocephin. Please make NPO after midnight for surgery tomorrow morning.   Nicolette Bang 07/22/2021, 4:51 PM

## 2021-07-22 NOTE — ED Notes (Signed)
Upon assessment patient vomiting. New linen and gown placed on patient. EDP aware.

## 2021-07-23 ENCOUNTER — Inpatient Hospital Stay (HOSPITAL_COMMUNITY): Payer: Medicaid Other | Admitting: Anesthesiology

## 2021-07-23 ENCOUNTER — Encounter (HOSPITAL_COMMUNITY)
Admission: EM | Disposition: A | Discharge status: Home / Self Care | Payer: Self-pay | Source: Home / Self Care | Attending: Family Medicine

## 2021-07-23 ENCOUNTER — Encounter (HOSPITAL_COMMUNITY): Payer: Self-pay | Admitting: Family Medicine

## 2021-07-23 ENCOUNTER — Inpatient Hospital Stay (HOSPITAL_COMMUNITY): Payer: Medicaid Other

## 2021-07-23 DIAGNOSIS — N201 Calculus of ureter: Secondary | ICD-10-CM

## 2021-07-23 HISTORY — PX: CYSTOSCOPY W/ URETERAL STENT PLACEMENT: SHX1429

## 2021-07-23 LAB — BASIC METABOLIC PANEL
Anion gap: 6 (ref 5–15)
BUN: 8 mg/dL (ref 6–20)
CO2: 21 mmol/L — ABNORMAL LOW (ref 22–32)
Calcium: 7.4 mg/dL — ABNORMAL LOW (ref 8.9–10.3)
Chloride: 108 mmol/L (ref 98–111)
Creatinine, Ser: 0.87 mg/dL (ref 0.44–1.00)
GFR, Estimated: >60 mL/min (ref >60)
Glucose, Bld: 116 mg/dL — ABNORMAL HIGH (ref 70–99)
Potassium: 3.5 mmol/L (ref 3.5–5.1)
Sodium: 135 mmol/L (ref 135–145)

## 2021-07-23 LAB — CBC
HCT: 32.3 % — ABNORMAL LOW (ref 36.0–46.0)
Hemoglobin: 10.1 g/dL — ABNORMAL LOW (ref 12.0–15.0)
MCH: 28.5 pg (ref 26.0–34.0)
MCHC: 31.3 g/dL (ref 30.0–36.0)
MCV: 91 fL (ref 80.0–100.0)
Platelets: 227 10*3/uL (ref 150–400)
RBC: 3.55 MIL/uL — ABNORMAL LOW (ref 3.87–5.11)
RDW: 13.6 % (ref 11.5–15.5)
WBC: 17.4 10*3/uL — ABNORMAL HIGH (ref 4.0–10.5)
nRBC: 0 % (ref 0.0–0.2)

## 2021-07-23 LAB — SURGICAL PCR SCREEN
MRSA, PCR: NEGATIVE
Staphylococcus aureus: NEGATIVE

## 2021-07-23 SURGERY — CYSTOSCOPY, WITH RETROGRADE PYELOGRAM AND URETERAL STENT INSERTION
Anesthesia: General | Site: Ureter | Laterality: Right

## 2021-07-23 MED ORDER — ONDANSETRON HCL 4 MG/2ML IJ SOLN
INTRAMUSCULAR | Status: DC | PRN
  Administered 2021-07-23: 4 mg via INTRAVENOUS

## 2021-07-23 MED ORDER — SODIUM CHLORIDE 0.9 % IV SOLN: INTRAVENOUS | Status: DC

## 2021-07-23 MED ORDER — SUCCINYLCHOLINE 20MG/ML (10ML) SYRINGE FOR MEDFUSION PUMP - OPTIME
INTRAMUSCULAR | Status: DC | PRN
  Administered 2021-07-23: 120 mg via INTRAVENOUS

## 2021-07-23 MED ORDER — POLYETHYLENE GLYCOL 3350 17 G PO PACK
17.0000 g | PACK | Freq: Two times a day (BID) | ORAL | Status: DC
Start: 2021-07-23 — End: 2021-07-24
  Administered 2021-07-23 – 2021-07-24 (×3): 17 g via ORAL
  Filled 2021-07-23 (×3): qty 1

## 2021-07-23 MED ORDER — PROPOFOL 10 MG/ML IV BOLUS
INTRAVENOUS | Status: AC
  Filled 2021-07-23: qty 20

## 2021-07-23 MED ORDER — CEFTRIAXONE SODIUM 2 G IJ SOLR
2.0000 g | INTRAMUSCULAR | Status: DC
  Administered 2021-07-23: 2 g via INTRAVENOUS
  Filled 2021-07-23: qty 20

## 2021-07-23 MED ORDER — ORAL CARE MOUTH RINSE: 15.0000 mL | Freq: Once | OROMUCOSAL | Status: AC

## 2021-07-23 MED ORDER — HYDROMORPHONE HCL 1 MG/ML IJ SOLN: 0.2500 mg | INTRAMUSCULAR | Status: DC | PRN

## 2021-07-23 MED ORDER — ONDANSETRON HCL 4 MG/2ML IJ SOLN
INTRAMUSCULAR | Status: AC
  Filled 2021-07-23: qty 2

## 2021-07-23 MED ORDER — SUGAMMADEX SODIUM 200 MG/2ML IV SOLN
INTRAVENOUS | Status: DC | PRN
  Administered 2021-07-23: 200 mg via INTRAVENOUS

## 2021-07-23 MED ORDER — BELLADONNA ALKALOIDS-OPIUM 16.2-60 MG RE SUPP
1.0000 | Freq: Four times a day (QID) | RECTAL | Status: DC | PRN
Start: 2021-07-23 — End: 2021-07-23

## 2021-07-23 MED ORDER — LIDOCAINE HCL (PF) 2 % IJ SOLN
INTRAMUSCULAR | Status: AC
  Filled 2021-07-23: qty 5

## 2021-07-23 MED ORDER — ACETAMINOPHEN 325 MG PO TABS
650.0000 mg | ORAL_TABLET | Freq: Four times a day (QID) | ORAL | Status: DC | PRN
  Administered 2021-07-23: 650 mg via ORAL
  Filled 2021-07-23: qty 2

## 2021-07-23 MED ORDER — STERILE WATER FOR IRRIGATION IR SOLN
Status: DC | PRN
  Administered 2021-07-23: 500 mL

## 2021-07-23 MED ORDER — DIATRIZOATE MEGLUMINE 30 % UR SOLN
URETHRAL | Status: DC | PRN
  Administered 2021-07-23: 6 mL via URETHRAL

## 2021-07-23 MED ORDER — PROPOFOL 10 MG/ML IV BOLUS
INTRAVENOUS | Status: DC | PRN
Start: 2021-07-23 — End: 2021-07-23
  Administered 2021-07-23: 200 mg via INTRAVENOUS

## 2021-07-23 MED ORDER — MIDAZOLAM HCL 5 MG/5ML IJ SOLN
INTRAMUSCULAR | Status: DC | PRN
  Administered 2021-07-23: 2 mg via INTRAVENOUS

## 2021-07-23 MED ORDER — OXYCODONE HCL 5 MG PO TABS
5.0000 mg | ORAL_TABLET | ORAL | Status: DC | PRN
  Administered 2021-07-23 – 2021-07-24 (×5): 5 mg via ORAL
  Filled 2021-07-23 (×5): qty 1

## 2021-07-23 MED ORDER — KETOROLAC TROMETHAMINE 30 MG/ML IJ SOLN
30.0000 mg | Freq: Four times a day (QID) | INTRAMUSCULAR | Status: DC | PRN
  Administered 2021-07-23 (×2): 30 mg via INTRAVENOUS
  Filled 2021-07-23 (×2): qty 1

## 2021-07-23 MED ORDER — FENTANYL CITRATE (PF) 100 MCG/2ML IJ SOLN
INTRAMUSCULAR | Status: DC | PRN
  Administered 2021-07-23: 100 ug via INTRAVENOUS

## 2021-07-23 MED ORDER — CHLORHEXIDINE GLUCONATE 0.12 % MT SOLN
15.0000 mL | Freq: Once | OROMUCOSAL | Status: AC
  Administered 2021-07-23: 15 mL via OROMUCOSAL

## 2021-07-23 MED ORDER — HYOSCYAMINE SULFATE 0.125 MG SL SUBL
0.1250 mg | SUBLINGUAL_TABLET | SUBLINGUAL | Status: DC | PRN
  Administered 2021-07-23 – 2021-07-24 (×5): 0.125 mg via ORAL
  Filled 2021-07-23 (×5): qty 1

## 2021-07-23 MED ORDER — LIDOCAINE HCL (CARDIAC) PF 50 MG/5ML IV SOSY
PREFILLED_SYRINGE | INTRAVENOUS | Status: DC | PRN
Start: 2021-07-23 — End: 2021-07-23
  Administered 2021-07-23: 80 mg via INTRAVENOUS

## 2021-07-23 MED ORDER — FENTANYL CITRATE (PF) 100 MCG/2ML IJ SOLN
INTRAMUSCULAR | Status: AC
  Filled 2021-07-23: qty 2

## 2021-07-23 MED ORDER — ONDANSETRON HCL 4 MG/2ML IJ SOLN: 4.0000 mg | Freq: Once | INTRAMUSCULAR | Status: DC | PRN

## 2021-07-23 MED ORDER — LACTATED RINGERS IV SOLN: INTRAVENOUS | Status: DC

## 2021-07-23 MED ORDER — CHLORHEXIDINE GLUCONATE CLOTH 2 % EX PADS
6.0000 | MEDICATED_PAD | Freq: Every day | CUTANEOUS | Status: DC
  Administered 2021-07-24: 6 via TOPICAL

## 2021-07-23 MED ORDER — MIDAZOLAM HCL 2 MG/2ML IJ SOLN
INTRAMUSCULAR | Status: AC
  Filled 2021-07-23: qty 2

## 2021-07-23 MED ORDER — MEPERIDINE HCL 50 MG/ML IJ SOLN: 6.2500 mg | INTRAMUSCULAR | Status: DC | PRN

## 2021-07-23 MED ORDER — SODIUM CHLORIDE 0.9 % IR SOLN
Status: DC | PRN
  Administered 2021-07-23: 3000 mL

## 2021-07-23 MED ORDER — HEPARIN SODIUM (PORCINE) 5000 UNIT/ML IJ SOLN
5000.0000 [IU] | Freq: Three times a day (TID) | INTRAMUSCULAR | Status: DC
  Administered 2021-07-23 – 2021-07-24 (×4): 5000 [IU] via SUBCUTANEOUS
  Filled 2021-07-23 (×4): qty 1

## 2021-07-23 MED ORDER — DIATRIZOATE MEGLUMINE 30 % UR SOLN
URETHRAL | Status: AC
  Filled 2021-07-23: qty 100

## 2021-07-23 MED ORDER — ROCURONIUM 10MG/ML (10ML) SYRINGE FOR MEDFUSION PUMP - OPTIME
INTRAVENOUS | Status: DC | PRN
  Administered 2021-07-23: 20 mg via INTRAVENOUS

## 2021-07-23 SURGICAL SUPPLY — 22 items
BAG DRAIN URO TABLE W/ADPT NS (BAG) ×2 IMPLANT
BAG HAMPER (MISCELLANEOUS) ×2 IMPLANT
CATH INTERMIT  6FR 70CM (CATHETERS) ×2 IMPLANT
CLOTH BEACON ORANGE TIMEOUT ST (SAFETY) ×2 IMPLANT
DECANTER SPIKE VIAL GLASS SM (MISCELLANEOUS) ×2 IMPLANT
GLOVE SURG POLYISO LF SZ8 (GLOVE) ×2 IMPLANT
GLOVE SURG UNDER POLY LF SZ7 (GLOVE) ×4 IMPLANT
GOWN STRL REUS W/TWL LRG LVL3 (GOWN DISPOSABLE) ×2 IMPLANT
GOWN STRL REUS W/TWL XL LVL3 (GOWN DISPOSABLE) ×2 IMPLANT
GUIDEWIRE STR ZIPWIRE 035X150 (MISCELLANEOUS) ×2 IMPLANT
IV NS IRRIG 3000ML ARTHROMATIC (IV SOLUTION) ×2 IMPLANT
KIT TURNOVER CYSTO (KITS) ×2 IMPLANT
MANIFOLD NEPTUNE II (INSTRUMENTS) ×2 IMPLANT
PACK CYSTO (CUSTOM PROCEDURE TRAY) ×2 IMPLANT
PAD ARMBOARD 7.5X6 YLW CONV (MISCELLANEOUS) ×2 IMPLANT
STENT URET 6FRX24 CONTOUR (STENTS) ×1 IMPLANT
STENT URET 6FRX26 CONTOUR (STENTS) IMPLANT
SYR 10ML LL (SYRINGE) ×2 IMPLANT
TOWEL OR 17X26 4PK STRL BLUE (TOWEL DISPOSABLE) ×2 IMPLANT
TRAY FOL W/BAG SLVR 16FR STRL (SET/KITS/TRAYS/PACK) IMPLANT
TRAY FOLEY W/BAG SLVR 16FR LF (SET/KITS/TRAYS/PACK) ×1
WATER STERILE IRR 500ML POUR (IV SOLUTION) ×2 IMPLANT

## 2021-07-23 NOTE — TOC Progression Note (Signed)
°  Transition of Care [facility]) Screening Note   Patient Details  Name: Stefanie Farmer Date of Birth: September 05, 1995   Transition of Care [facility]) CM/SW Contact:    Shade Flood, LCSW Phone Number: 07/23/2021, 10:32 AM    Transition of Care Department [facility]) has reviewed patient and no TOC needs have been identified at this time. We will continue to monitor patient advancement through interdisciplinary progression rounds. If new patient transition needs arise, please place a TOC consult.

## 2021-07-23 NOTE — Progress Notes (Signed)
°   07/23/21 1327 07/23/21 1551  Assess: MEWS Score  Temp (!) 101 F (38.3 C) (!) 101.4 F (38.6 C)  BP (!) 116/57 (!) 99/45  Pulse Rate (!) 108 (!) 108  Resp 20 18  SpO2  --  96 %  O2 Device  --  Nasal Cannula  O2 Flow Rate (L/min)  --  2 L/min  Assess: MEWS Score  MEWS Temp 1 1  MEWS Systolic 0 1  MEWS Pulse 1 1  MEWS RR 0 0  MEWS LOC 0 0  MEWS Score 2 3  MEWS Score Color Yellow Yellow  Assess: if the MEWS score is Yellow or Red  Were vital signs taken at a resting state?  --  Yes  Focused Assessment  --  No change from prior assessment  Early Detection of Sepsis Score *See Row Information*  --  Low  MEWS guidelines implemented *See Row Information*  --  Yes  Treat  MEWS Interventions  --  Escalated (See documentation below)  Pain Scale  --  0-10  Pain Score  --  8  Pain Descriptors / Indicators  --  Headache  Pain Intervention(s)  --  Medication (See eMAR)

## 2021-07-23 NOTE — Interval H&P Note (Signed)
History and Physical Interval Note:  07/23/2021 7:27 AM  Stefanie Farmer  has presented today for surgery, with the diagnosis of Right Ureteral Calculus.  The various methods of treatment have been discussed with the patient and family. After consideration of risks, benefits and other options for treatment, the patient has consented to  Procedure(s): CYSTOSCOPY WITH RETROGRADE PYELOGRAM/URETERAL STENT PLACEMENT (Right) as a surgical intervention.  The patient's history has been reviewed, patient examined, no change in status, stable for surgery.  I have reviewed the patient's chart and labs.  Questions were answered to the patient's satisfaction.     Wilkie Aye

## 2021-07-23 NOTE — Anesthesia Procedure Notes (Signed)
Procedure Name: Intubation Date/Time: 07/23/2021 7:48 AM Performed by: Ollen Bowl, CRNA Pre-anesthesia Checklist: Patient identified, Patient being monitored, Timeout performed, Emergency Drugs available and Suction available Patient Re-evaluated:Patient Re-evaluated prior to induction Oxygen Delivery Method: Circle system utilized Preoxygenation: Pre-oxygenation with 100% oxygen Induction Type: IV induction Ventilation: Mask ventilation without difficulty Laryngoscope Size: Mac and 3 Grade View: Grade I Tube type: Oral Tube size: 7.0 mm Number of attempts: 1 Airway Equipment and Method: Stylet Placement Confirmation: ETT inserted through vocal cords under direct vision, positive ETCO2 and breath sounds checked- equal and bilateral Secured at: 21 cm Tube secured with: Tape Dental Injury: Teeth and Oropharynx as per pre-operative assessment

## 2021-07-23 NOTE — Op Note (Signed)
.  Preoperative diagnosis: right UVJ stone, fever  Postoperative diagnosis: Same  Procedure: 1 cystoscopy 2. right retrograde pyelography 3.  Intraoperative fluoroscopy, under one hour, with interpretation 4.  right 6 x 24 JJ stent placement  Attending: Wilkie Aye  Anesthesia: General  Estimated blood loss: None  Drains: Right 6 x 24 JJ ureteral stent without tether, 16 French foley catheter  Specimens: none  Antibiotics: rocephin  Findings: right UVJ stone. Moderate hydronephrosis. No masses/lesions in the bladder. Ureteral orifices in normal anatomic location.  Indications: Patient is a 26 year old female with a history of a right ureteral calculus who developed fever.  After discussing treatment options, they decided proceed with right stent placement.  Procedure in detail: The patient was brought to the operating room and a brief timeout was done to ensure correct patient, correct procedure, correct site.  General anesthesia was administered patient was placed in dorsal lithotomy position.  Their genitalia was then prepped and draped in usual sterile fashion.  A rigid 22 French cystoscope was passed in the urethra and the bladder.  Bladder was inspected free masses or lesions.  the ureteral orifices were in the normal orthotopic locations.  a 6 french ureteral catheter was then instilled into the right ureteral orifice.  a gentle retrograde was obtained and findings noted above.  we then placed a zip wire through the ureteral catheter and advanced up to the renal pelvis.    We then placed a 6 x 24 double-j ureteral stent over the original zip wire.  We then removed the wire and good coil was noted in the the renal pelvis under fluoroscopy and the bladder under direct vision.  A foley catheter was then placed. the bladder was then drained and this concluded the procedure which was well tolerated by patient.  Complications: None  Condition: Stable, extubated, transferred to  PACU  Plan: Patient is to be admitted for IV antibiotics. She will have her stone extraction in 2 weeks.

## 2021-07-23 NOTE — Progress Notes (Signed)
°   07/23/21 1327  Assess: MEWS Score  Temp (!) 101 F (38.3 C)  BP (!) 116/57  Pulse Rate (!) 108  Resp 20  SpO2 91 %  O2 Device Room Air  Assess: MEWS Score  MEWS Temp 1  MEWS Systolic 0  MEWS Pulse 1  MEWS RR 0  MEWS LOC 0  MEWS Score 2  MEWS Score Color Yellow  Assess: if the MEWS score is Yellow or Red  Were vital signs taken at a resting state? Yes  Focused Assessment No change from prior assessment  Early Detection of Sepsis Score *See Row Information* Low  MEWS guidelines implemented *See Row Information* Yes  Treat  MEWS Interventions Escalated (See documentation below)  Pain Scale 0-10  Pain Score 0  Take Vital Signs  Increase Vital Sign Frequency  Yellow: Q 2hr X 2 then Q 4hr X 2, if remains yellow, continue Q 4hrs  Escalate  MEWS: Escalate Yellow: discuss with charge nurse/RN and consider discussing with provider and RRT  Notify: Charge Nurse/RN  Name of Charge Nurse/RN Notified Mable Fill, RN  Date Charge Nurse/RN Notified 07/23/21  Time Charge Nurse/RN Notified 1328  Notify: Provider  Provider Name/Title Dr. Joesph Fillers  Date Provider Notified 07/23/21  Time Provider Notified 1328  Notification Type  (secure chat)  Document  Patient Outcome Stabilized after interventions

## 2021-07-23 NOTE — Transfer of Care (Signed)
Immediate Anesthesia Transfer of Care Note  Patient: Stefanie Farmer  Procedure(s) Performed: CYSTOSCOPY WITH RETROGRADE PYELOGRAM/URETERAL STENT PLACEMENT (Right: Ureter)  Patient Location: PACU  Anesthesia Type:General  Level of Consciousness: awake  Airway & Oxygen Therapy: Patient Spontanous Breathing  Post-op Assessment: Report given to RN  Post vital signs: Reviewed and stable  Last Vitals:  Vitals Value Taken Time  BP 112/44 07/23/21 0826  Temp    Pulse 101 07/23/21 0827  Resp 27 07/23/21 0827  SpO2 86 % 07/23/21 0827  Vitals shown include unvalidated device data.  Last Pain:  Vitals:   07/23/21 0627  TempSrc: Oral  PainSc: 0-No pain      Patients Stated Pain Goal: 2 (07/22/21 2048)  Complications: No notable events documented.

## 2021-07-23 NOTE — Anesthesia Preprocedure Evaluation (Addendum)
Anesthesia Evaluation  Patient identified by MRN, date of birth, ID band Patient awake    Reviewed: Allergy & Precautions, NPO status , Patient's Chart, lab work & pertinent test results  Airway Mallampati: III  TM Distance: >3 FB Neck ROM: Full    Dental  (+) Dental Advisory Given, Chipped,    Pulmonary Patient abstained from smoking., former smoker,    Pulmonary exam normal breath sounds clear to auscultation       Cardiovascular negative cardio ROS Normal cardiovascular exam Rhythm:Regular Rate:Normal     Neuro/Psych negative neurological ROS  negative psych ROS   GI/Hepatic negative GI ROS, (+)     substance abuse  marijuana use,   Endo/Other  Morbid obesity  Renal/GU Renal disease (stones)  negative genitourinary   Musculoskeletal negative musculoskeletal ROS (+)   Abdominal   Peds negative pediatric ROS (+)  Hematology negative hematology ROS (+)   Anesthesia Other Findings   Reproductive/Obstetrics negative OB ROS                            Anesthesia Physical Anesthesia Plan  ASA: 3  Anesthesia Plan: General   Post-op Pain Management: Dilaudid IV   Induction: Intravenous  PONV Risk Score and Plan: 4 or greater and Ondansetron and Dexamethasone  Airway Management Planned: Oral ETT  Additional Equipment:   Intra-op Plan:   Post-operative Plan: Extubation in OR  Informed Consent: I have reviewed the patients History and Physical, chart, labs and discussed the procedure including the risks, benefits and alternatives for the proposed anesthesia with the patient or authorized representative who has indicated his/her understanding and acceptance.     Dental advisory given  Plan Discussed with: CRNA and Surgeon  Anesthesia Plan Comments:         Anesthesia Quick Evaluation

## 2021-07-23 NOTE — Progress Notes (Signed)
PROGRESS NOTE     Stefanie Farmer, is a 26 y.o. female, DOB - July 29, 1995, [id number]  Admit date - 07/22/2021   Admitting Physician Shelsy Seng Denton Brick, MD  Outpatient Primary MD for the patient is Patient, No Pcp Per (Inactive)  LOS - 1  Chief Complaint  Patient presents with   Flank Pain        Brief Narrative:  26 y.o. female with morbid obesity and recent E. coli UTI from culture dated 06/15/2021 presents with right flank pain since 07/20/2021 radiating to her groin associated with nausea vomiting and dysuria with urinary frequency admitted on 07/22/21 with sepsis secondary to complicated acute UTI/infected kidney stone -On 07/23/2021 patient underwent cystoscopy with right retrograde pyelography and intraoperative fluoroscopy with placement of right JJ stent and Foley catheter    -Assessment and Plan: 1) right-sided nephrolithiasis--  infected stone CT AP shows Obstructing 9 mm distal right ureteral calculus results in moderate right  hydroureteronephrosis. --On 07/23/2021 patient underwent cystoscopy with right retrograde pyelography and intraoperative fluoroscopy with placement of right JJ stent and Foley catheter -Continue IV Rocephin pending further culture data   2) sepsis secondary to complicated acute UTI--POA history of pansensitive E. coli on June 15, 2021 -Patient meets sepsis criteria on admission with tachycardia, tachypnea, leukocytosis and complicated UTI -WBC [id number] 17.4 -Appears to have UTI in the setting of right-sided nephrolithiasis --Lactic acid is 2.0-- IV fluids  ordered repeat lactic acid down to 1.4 after IV fluid -IV Rocephin as ordered -IV fluids -Urology consult appreciated   3)Morbid Obesity- -Low calorie diet, portion control and increase physical activity discussed with patient -Body mass index is 50.77 kg/m.   4) acute anemia--hemoglobin 10.1 previous baseline around 13 suspect some component of hemodilution -Degree of hematuria will not  explain patient's drop in H&H   Disposition/Need for in-Hospital Stay- patient unable to be discharged at this time due to --sepsis due to complicated UTI with infected stone requiring IV antibiotics and IV fluids,     Status is: Inpatient   Remains inpatient appropriate because:    Dispo: The patient is from: Home              Anticipated d/c is to: Home              Anticipated d/c date is: 1 days              Patient currently is not medically stable to d/c. Barriers: Not Clinically Stable-    Code Status :  -  Code Status: Full Code   Family Communication:   NA (patient is alert, awake and coherent)   DVT Prophylaxis  :   - SCDs   heparin injection 5,000 Units Start: 07/23/21 1400 SCDs Start: 07/23/21 1047   Lab Results  Component Value Date   PLT 227 07/23/2021    Inpatient Medications  Scheduled Meds:  Chlorhexidine Gluconate Cloth  6 each Topical Daily   heparin  5,000 Units Subcutaneous Q8H   Continuous Infusions: PRN Meds:.opium-belladonna, hyoscyamine, ketorolac, oxyCODONE   Anti-infectives (From admission, onward)    Start     Dose/Rate Route Frequency Ordered Stop   07/22/21 1800  fluconazole (DIFLUCAN) IVPB 200 mg  Status:  Discontinued        200 mg 100 mL/hr over 60 Minutes Intravenous Every 24 hours 07/22/21 1704 07/23/21 0931   07/22/21 1330  cefTRIAXone (ROCEPHIN) 2 g in sodium chloride 0.9 % 100 mL IVPB  Status:  Discontinued  2 g 200 mL/hr over 30 Minutes Intravenous Every 24 hours 07/22/21 1328 07/23/21 0931         Subjective: Stefanie Farmer today has no fevers, no emesis,  No chest pain,   -Requesting Foley catheter removal --rationale for placing Foley explained to patient again -Right flank/CVA area pain is improving   Objective: Vitals:   07/23/21 0830 07/23/21 0845 07/23/21 0900 07/23/21 0926  BP: (!) 101/46 (!) 104/46 (!) 116/50 (!) 105/50  Pulse: 100 94 90 97  Resp: (!) 26 (!) 24 (!) 24 18  Temp:    98.6 F (37 C)   TempSrc:    Oral  SpO2: 96% 95% 97% 91%  Weight:      Height:        Intake/Output Summary (Last 24 hours) at 07/23/2021 1055 Last data filed at 07/23/2021 [id number] Gross per 24 hour  Intake 1627.37 ml  Output 110 ml  Net 1517.37 ml   Filed Weights   07/22/21 0733 07/22/21 1442  Weight: 127 kg 130 kg    Physical Exam  Physical Examination: General appearance - alert, morbidly obese and in no distress  Mental status - alert, oriented to person, place, and time,  Eyes - sclera anicteric Neck - supple, no JVD elevation , Chest - clear  to auscultation bilaterally, symmetrical air movement,  Heart - S1 and S2 normal, regular  Abdomen - soft, nondistended, increased truncal adiposity noted, patient has improving right flank tenderness, improving right CVA area tenderness, Neurological - screening mental status exam normal, neck supple without rigidity, cranial nerves II through XII intact, DTR's normal and symmetric Extremities - no pedal edema noted, intact peripheral pulses  Skin - warm, dry GU-Foley with slight hematuria  Data Reviewed: I have personally reviewed following labs and imaging studies  CBC: Recent Labs  Lab 07/22/21 0742 07/23/21 0553  WBC 17.7* 17.4*  HGB 11.8* 10.1*  HCT 37.3 32.3*  MCV 90.1 91.0  PLT 293 [id number]   Basic Metabolic Panel: Recent Labs  Lab 07/22/21 0742 07/23/21 0553  NA 135 135  K 4.0 3.5  CL 104 108  CO2 23 21*  GLUCOSE 124* 116*  BUN 11 8  CREATININE 0.96 0.87  CALCIUM 8.6* 7.4*   GFR: Estimated Creatinine Clearance: 132.4 mL/min (by C-G formula based on SCr of 0.87 mg/dL). Liver Function Tests: No results for input(s): AST, ALT, ALKPHOS, BILITOT, PROT, ALBUMIN in the last 168 hours. Cardiac Enzymes: No results for input(s): CKTOTAL, CKMB, CKMBINDEX, TROPONINI in the last 168 hours. BNP (last 3 results) No results for input(s): PROBNP in the last 8760 hours. HbA1C: No results for input(s): HGBA1C in the last 72 hours. Sepsis  Labs: @LABRCNTIP (procalcitonin:4,lacticidven:4) ) Recent Results (from the past 240 hour(s))  Resp Panel by RT-PCR (Flu A&B, Covid) Nasopharyngeal Swab     Status: None   Collection Time: 07/22/21 10:10 AM   Specimen: Nasopharyngeal Swab; Nasopharyngeal(NP) swabs in vial transport medium  Result Value Ref Range Status   SARS Coronavirus 2 by RT PCR NEGATIVE NEGATIVE Final    Comment: (NOTE) SARS-CoV-2 target nucleic acids are NOT DETECTED.  The SARS-CoV-2 RNA is generally detectable in upper respiratory specimens during the acute phase of infection. The lowest concentration of SARS-CoV-2 viral copies this assay can detect is 138 copies/mL. A negative result does not preclude SARS-Cov-2 infection and should not be used as the sole basis for treatment or other patient management decisions. A negative result may occur with  improper specimen collection/handling, submission of  specimen other than nasopharyngeal swab, presence of viral mutation(s) within the areas targeted by this assay, and inadequate number of viral copies(<138 copies/mL). A negative result must be combined with clinical observations, patient history, and epidemiological information. The expected result is Negative.  Fact Sheet for Patients:  EntrepreneurPulse.com.au  Fact Sheet for Healthcare Providers:  IncredibleEmployment.be  This test is no t yet approved or cleared by the [place] FDA and  has been authorized for detection and/or diagnosis of SARS-CoV-2 by FDA under an Emergency Use Authorization (EUA). This EUA will remain  in effect (meaning this test can be used) for the duration of the COVID-19 declaration under Section 564(b)(1) of the Act, 21 U.S.C.section 360bbb-3(b)(1), unless the authorization is terminated  or revoked sooner.       Influenza A by PCR NEGATIVE NEGATIVE Final   Influenza B by PCR NEGATIVE NEGATIVE Final    Comment: (NOTE) The Xpert Xpress  SARS-CoV-2/FLU/RSV plus assay is intended as an aid in the diagnosis of influenza from Nasopharyngeal swab specimens and should not be used as a sole basis for treatment. Nasal washings and aspirates are unacceptable for Xpert Xpress SARS-CoV-2/FLU/RSV testing.  Fact Sheet for Patients: EntrepreneurPulse.com.au  Fact Sheet for Healthcare Providers: IncredibleEmployment.be  This test is not yet approved or cleared by the [place] FDA and has been authorized for detection and/or diagnosis of SARS-CoV-2 by FDA under an Emergency Use Authorization (EUA). This EUA will remain in effect (meaning this test can be used) for the duration of the COVID-19 declaration under Section 564(b)(1) of the Act, 21 U.S.C. section 360bbb-3(b)(1), unless the authorization is terminated or revoked.  Performed at [facility], [address], [place], [place] [address]   Culture, blood (Routine X 2) w Reflex to ID Panel     Status: None (Preliminary result)   Collection Time: 07/22/21  5:28 PM   Specimen: BLOOD RIGHT ARM  Result Value Ref Range Status   Specimen Description BLOOD RIGHT ARM  Final   Special Requests   Final    BOTTLES DRAWN AEROBIC AND ANAEROBIC Blood Culture adequate volume   Culture   Final    NO GROWTH < 24 HOURS Performed at [facility], [address] [place], [place] [address]    Report Status PENDING  Incomplete  Culture, blood (Routine X 2) w Reflex to ID Panel     Status: None (Preliminary result)   Collection Time: 07/22/21  5:28 PM   Specimen: BLOOD LEFT ARM  Result Value Ref Range Status   Specimen Description BLOOD LEFT ARM  Final   Special Requests   Final    BOTTLES DRAWN AEROBIC AND ANAEROBIC Blood Culture adequate volume   Culture   Final    NO GROWTH < 24 HOURS Performed at [facility], [address], [place], [place] [address]    Report Status PENDING  Incomplete  Surgical pcr screen     Status: None   Collection Time:  07/23/21 12:33 AM   Specimen: Nasal Mucosa; Nasal Swab  Result Value Ref Range Status   MRSA, PCR NEGATIVE NEGATIVE Final   Staphylococcus aureus NEGATIVE NEGATIVE Final    Comment: (NOTE) The Xpert SA Assay (FDA approved for NASAL specimens in patients 76 years of age and older), is one component of a comprehensive surveillance program. It is not intended to diagnose infection nor to guide or monitor treatment. Performed at [facility], [address] [place], [place] [address]       Radiology Studies:  CT ABDOMEN PELVIS W CONTRAST  Result Date: 07/22/2021 CLINICAL DATA:  Right flank pain, concern for kidney stone. EXAM: CT ABDOMEN AND PELVIS WITH CONTRAST TECHNIQUE: Multidetector CT imaging of the abdomen and pelvis was performed using the standard protocol following bolus administration of intravenous contrast. RADIATION DOSE REDUCTION: This exam was performed according to the departmental dose-optimization program which includes automated exposure control, adjustment of the mA and/or kV according to patient size and/or use of iterative reconstruction technique. CONTRAST:  141mL OMNIPAQUE IOHEXOL 300 MG/ML  SOLN COMPARISON:  None. FINDINGS: Lower chest: No acute abnormality. Hepatobiliary: No focal liver abnormality is seen. No gallstones, gallbladder wall thickening, or biliary dilatation. Pancreas: Unremarkable. No pancreatic ductal dilatation or surrounding inflammatory changes. Spleen: Normal in size without focal abnormality. Adrenals/Urinary Tract: Adrenal glands are unremarkable. An obstructing 9 mm calculus is seen in the distal right ureter resulting in moderate right hydroureteronephrosis. There is moderate edema surrounding the right kidney. No calculi are seen in the right kidney. The left kidney is normal, without renal calculi, focal lesion, or hydronephrosis. Bladder is unremarkable. Stomach/Bowel: Stomach is within normal limits. Appendix appears normal. No evidence of bowel wall  thickening, distention, or inflammatory changes. Vascular/Lymphatic: No significant vascular findings are present. No enlarged abdominal or pelvic lymph nodes. Reproductive: Uterus and bilateral adnexa are unremarkable. Other: No abdominal wall hernia or abnormality. No abdominopelvic ascites. 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 IMPRESSION: Obstructing 9 mm distal right ureteral calculus results in moderate right hydroureteronephrosis. Electronically Signed   By: Zerita Boers M.D.   On: 07/22/2021 09:19   DG C-Arm 1-60 Min-No Report  Result Date: 07/23/2021 Fluoroscopy was utilized by the requesting physician.  No radiographic interpretation.     Scheduled Meds:  Chlorhexidine Gluconate Cloth  6 each Topical Daily   heparin  5,000 Units Subcutaneous Q8H   Continuous Infusions:   LOS: 1 day    Roxan Hockey M.D on 07/23/2021 at 10:55 AM  Go to [email or web address] - for contact info  [facility] - Office  [phone]  If 7PM-7AM, please contact night-coverage [email or web address] Password [facility] 07/23/2021, 10:55 AM

## 2021-07-23 NOTE — Anesthesia Postprocedure Evaluation (Signed)
Anesthesia Post Note  Patient: Nani Obey  Procedure(s) Performed: CYSTOSCOPY WITH RETROGRADE PYELOGRAM/URETERAL STENT PLACEMENT (Right: Ureter)  Patient location during evaluation: Phase II Anesthesia Type: General Level of consciousness: awake and alert and oriented Pain management: pain level controlled Vital Signs Assessment: post-procedure vital signs reviewed and stable Respiratory status: spontaneous breathing, nonlabored ventilation and respiratory function stable Cardiovascular status: blood pressure returned to baseline and stable Postop Assessment: no apparent nausea or vomiting Anesthetic complications: no   No notable events documented.   Last Vitals:  Vitals:   07/23/21 0900 07/23/21 0926  BP: (!) 116/50 (!) 105/50  Pulse: 90 97  Resp: (!) 24 18  Temp:  37 C  SpO2: 97% 91%    Last Pain:  Vitals:   07/23/21 1121  TempSrc:   PainSc: 9                  Netta Fodge C Delyla Sandeen

## 2021-07-24 LAB — CBC
HCT: 30 % — ABNORMAL LOW (ref 36.0–46.0)
Hemoglobin: 9.6 g/dL — ABNORMAL LOW (ref 12.0–15.0)
MCH: 29.7 pg (ref 26.0–34.0)
MCHC: 32 g/dL (ref 30.0–36.0)
MCV: 92.9 fL (ref 80.0–100.0)
Platelets: 201 10*3/uL (ref 150–400)
RBC: 3.23 MIL/uL — ABNORMAL LOW (ref 3.87–5.11)
RDW: 13.9 % (ref 11.5–15.5)
WBC: 10.9 10*3/uL — ABNORMAL HIGH (ref 4.0–10.5)
nRBC: 0 % (ref 0.0–0.2)

## 2021-07-24 LAB — URINE CULTURE: Culture: >=100000 — AB

## 2021-07-24 MED ORDER — ONDANSETRON HCL 4 MG PO TABS: 4.0000 mg | ORAL_TABLET | Freq: Every day | ORAL | 1 refills | Status: DC | PRN

## 2021-07-24 MED ORDER — CEFTRIAXONE SODIUM 2 G IJ SOLR
2.0000 g | Freq: Once | INTRAMUSCULAR | Status: AC
  Administered 2021-07-24: 2 g via INTRAVENOUS
  Filled 2021-07-24: qty 20

## 2021-07-24 MED ORDER — CEPHALEXIN 500 MG PO CAPS: 500.0000 mg | ORAL_CAPSULE | Freq: Three times a day (TID) | ORAL | 0 refills | Status: AC

## 2021-07-24 MED ORDER — ACETAMINOPHEN 325 MG PO TABS
650.0000 mg | ORAL_TABLET | Freq: Four times a day (QID) | ORAL | 0 refills | Status: DC | PRN
Start: 2021-07-24 — End: 2021-09-15

## 2021-07-24 MED ORDER — HYOSCYAMINE SULFATE 0.125 MG SL SUBL: 0.1250 mg | SUBLINGUAL_TABLET | SUBLINGUAL | 0 refills | Status: DC | PRN

## 2021-07-24 MED ORDER — TAMSULOSIN HCL 0.4 MG PO CAPS
0.4000 mg | ORAL_CAPSULE | Freq: Every day | ORAL | Status: DC
  Administered 2021-07-24: 0.4 mg via ORAL
  Filled 2021-07-24: qty 1

## 2021-07-24 MED ORDER — TAMSULOSIN HCL 0.4 MG PO CAPS: 0.4000 mg | ORAL_CAPSULE | Freq: Every day | ORAL | 0 refills | Status: DC

## 2021-07-24 MED ORDER — POLYETHYLENE GLYCOL 3350 17 G PO PACK: 17.0000 g | PACK | Freq: Two times a day (BID) | ORAL | 0 refills | Status: DC

## 2021-07-24 MED ORDER — OXYCODONE-ACETAMINOPHEN 7.5-325 MG PO TABS: 1.0000 | ORAL_TABLET | Freq: Three times a day (TID) | ORAL | 0 refills | Status: DC | PRN

## 2021-07-24 NOTE — Discharge Instructions (Signed)
1) please drink plenty fluids at least 3 L of water daily for the next 3 to 4 days 2)Please follow-up with Urologist Dr. Ronne Binning in about 2 weeks --- for recheck and follow-up evaluation  in his office----[facility], [address], [address], [place] [address] Phone Number----[phone] 3) please avoid constipation

## 2021-07-24 NOTE — Progress Notes (Signed)
Patient ambulated in hall; O2 sats 89%-93% on room air while ambulating. Patient tolerated activity well.

## 2021-07-24 NOTE — Discharge Summary (Signed)
Stefanie Farmer, is a 26 y.o. female  DOB [birth year]  MRN [id number].  Admission date:  07/22/2021  Admitting Physician  Roxan Hockey, MD  Discharge Date:  07/24/2021   Primary MD  Patient, No Pcp Per (Inactive)  Recommendations for primary care physician for things to follow:   1)Please drink plenty fluids at least 3 L of water daily for the next 3 to 4 days 2)Please follow-up with Urologist Dr. Alyson Ingles in about 2 weeks --- for recheck and follow-up evaluation  in his office----[facility], [address], [place] [address] Phone Number----[phone]) please avoid constipation  Admission Diagnosis  Kidney stone [N20.0]   Discharge Diagnosis  Kidney stone [N20.0]    Principal Problem:   Complicated UTI (urinary tract infection) Active Problems:   Kidney stone   Morbid obesity with BMI of 45.0-49.9, adult ([place])      History reviewed. No pertinent past medical history.  Past Surgical History:  Procedure Laterality Date   FOOT SURGERY     ORIF ANKLE FRACTURE Right 06/24/2020   Procedure: OPEN REDUCTION INTERNAL FIXATION (ORIF) ANKLE FRACTURE;  Surgeon: Carole Civil, MD;  Location: [facility];  Service: Orthopedics;  Laterality: Right;       HPI  from the history and physical done on the day of admission:    Stefanie Farmer  is a 26 y.o. female with morbid obesity and recent E. coli UTI from culture dated 06/15/2021 presents with right flank pain since 07/20/2021 radiating to her groin associated with nausea vomiting and dysuria with urinary frequency -Additional history obtained from patient's boyfriend at bedside -Patient had chills but no documented fevers -In the ED respiratory rate and heart rate are up WBC 17.7 -Creatinine 0.96- CT abdomen and pelvis shows obstructing 9 mm distal right ureteral calculus results in moderate  right  hydroureteronephrosis. -Urology input appreciated plans for possible intervention on 07/23/2021 if patient is afebrile -Lactic acid is 2.0 IV fluids and antibiotics ordered repeat lactic acid pending -UA suggestive of UTI patient also has yeast     Hospital Course:     Brief Narrative:  26 y.o. female with morbid obesity and recent E. coli UTI from culture dated 06/15/2021 presents with right flank pain since 07/20/2021 radiating to her groin associated with nausea vomiting and dysuria with urinary frequency admitted on 07/22/21 with sepsis secondary to complicated acute UTI/infected kidney stone -On 07/23/2021 patient underwent cystoscopy with right retrograde pyelography and intraoperative fluoroscopy with placement of right JJ stent and Foley catheter   Assessment and Plan: 1) right-sided nephrolithiasis--  infected stone CT [facility] shows Obstructing 9 mm distal right ureteral calculus results in moderate right  hydroureteronephrosis. --On 07/23/2021 patient underwent cystoscopy with right retrograde pyelography and intraoperative fluoroscopy with placement of right JJ stent and Foley catheter -Outpatient follow-up with urologist as advised   2) sepsis secondary to complicated acute E. coli UTI--POA history of pansensitive E. coli on June 15, 2021 -Patient meets sepsis criteria on admission with tachycardia, tachypnea, leukocytosis  and complicated UTI -WBC [id number] 17.4>>10.9 -Appears to have UTI in the setting of right-sided nephrolithiasis --Lactic acid is 2.0-- IV fluids  ordered repeat lactic acid down to 1.4 after IV fluid -Treated with IV Rocephin, urine culture with pansensitive E. coli, discharged on Keflex -Urology consult appreciated   3)Morbid Obesity- -Low calorie diet, portion control and increase physical activity discussed with patient -Body mass index is 50.77 kg/m.   4) acute anemia--hemoglobin 10.1 previous baseline around 13 suspect some component of  hemodilution -Degree of hematuria will not explain patient's drop in H&H -Repeat CBC as outpatient advised   Disposition--- Home    Dispo: The patient is from: Home              Anticipated d/c is to: Home              Code Status :  -  Code Status: Full Code    Discharge Condition: Stable  Follow UP-urology Dr. Alyson Ingles   Consults obtained -urology  Diet and Activity recommendation:  As advised  Discharge Instructions     Discharge Instructions     Call MD for:  difficulty breathing, headache or visual disturbances   Complete by: As directed    Call MD for:  persistant dizziness or light-headedness   Complete by: As directed    Call MD for:  persistant nausea and vomiting   Complete by: As directed    Call MD for:  severe uncontrolled pain   Complete by: As directed    Call MD for:  temperature >100.4   Complete by: As directed    Diet - low sodium heart healthy   Complete by: As directed    Discharge instructions   Complete by: As directed    1) please drink plenty fluids at least 3 L of water daily for the next 3 to 4 days 2)Please follow-up with Urologist Dr. Alyson Ingles in about 2 weeks --- for recheck and follow-up evaluation  in his office----[facility], [address], [place] [address] Phone Number----[phone]) please avoid constipation   Increase activity slowly   Complete by: As directed    No wound care   Complete by: As directed        Discharge Medications     Allergies as of 07/24/2021   No Known Allergies      Medication List     STOP taking these medications    ciprofloxacin 500 MG tablet Commonly known as: CIPRO   ibuprofen 800 MG tablet Commonly known as: ADVIL       TAKE these medications    acetaminophen 325 MG tablet Commonly known as: TYLENOL Take 2 tablets (650 mg total) by mouth every 6 (six) hours as needed for mild pain or fever.   cephALEXin 500 MG capsule Commonly known as:  Keflex Take 1 capsule (500 mg total) by mouth 3 (three) times daily for 5 days.   hyoscyamine 0.125 MG SL tablet Commonly known as: LEVSIN SL Take 1 tablet (0.125 mg total) by mouth every 4 (four) hours as needed for cramping.   ondansetron 4 MG tablet Commonly known as: Zofran Take 1 tablet (4 mg total) by mouth daily as needed for nausea or vomiting.   oxyCODONE-acetaminophen 7.5-325 MG tablet Commonly known as: Percocet Take 1 tablet by mouth every 8 (eight) hours as needed for severe pain.   polyethylene glycol 17 g packet Commonly known as: MIRALAX / GLYCOLAX Take 17 g by mouth  2 (two) times daily.   tamsulosin 0.4 MG Caps capsule Commonly known as: FLOMAX Take 1 capsule (0.4 mg total) by mouth daily after supper.        Major procedures and Radiology Reports - PLEASE review detailed and final reports for all details, in brief -   CT ABDOMEN PELVIS W CONTRAST  Result Date: 07/22/2021 CLINICAL DATA:  Right flank pain, concern for kidney stone. EXAM: CT ABDOMEN AND PELVIS WITH CONTRAST TECHNIQUE: Multidetector CT imaging of the abdomen and pelvis was performed using the standard protocol following bolus administration of intravenous contrast. RADIATION DOSE REDUCTION: This exam was performed according to the departmental dose-optimization program which includes automated exposure control, adjustment of the mA and/or kV according to patient size and/or use of iterative reconstruction technique. CONTRAST:  128mL OMNIPAQUE IOHEXOL 300 MG/ML  SOLN COMPARISON:  None. FINDINGS: Lower chest: No acute abnormality. Hepatobiliary: No focal liver abnormality is seen. No gallstones, gallbladder wall thickening, or biliary dilatation. Pancreas: Unremarkable. No pancreatic ductal dilatation or surrounding inflammatory changes. Spleen: Normal in size without focal abnormality. Adrenals/Urinary Tract: Adrenal glands are unremarkable. An obstructing 9 mm calculus is seen in the distal right ureter  resulting in moderate right hydroureteronephrosis. There is moderate edema surrounding the right kidney. No calculi are seen in the right kidney. The left kidney is normal, without renal calculi, focal lesion, or hydronephrosis. Bladder is unremarkable. Stomach/Bowel: Stomach is within normal limits. Appendix appears normal. No evidence of bowel wall thickening, distention, or inflammatory changes. Vascular/Lymphatic: No significant vascular findings are present. No enlarged abdominal or pelvic lymph nodes. Reproductive: Uterus and bilateral adnexa are unremarkable. Other: No abdominal wall hernia or abnormality. No abdominopelvic ascites. 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 IMPRESSION: Obstructing 9 mm distal right ureteral calculus results in moderate right hydroureteronephrosis. Electronically Signed   By: Zerita Boers M.D.   On: 07/22/2021 09:19   DG C-Arm 1-60 Min-No Report  Result Date: 07/23/2021 Fluoroscopy was utilized by the requesting physician.  No radiographic interpretation.    Micro Results  Recent Results (from the past 240 hour(s))  Urine Culture     Status: Abnormal   Collection Time: 07/22/21  7:36 AM   Specimen: Urine, Clean Catch  Result Value Ref Range Status   Specimen Description   Final    URINE, CLEAN CATCH Performed at [facility], [address] [place], [place] [address]    Special Requests   Final    NONE Performed at [facility], [address] [place], [place] [address]    Culture >=100,000 COLONIES/mL ESCHERICHIA COLI (A)  Final   Report Status 07/24/2021 FINAL  Final   Organism ID, Bacteria ESCHERICHIA COLI (A)  Final      Susceptibility   Escherichia coli - MIC*    AMPICILLIN 8 SENSITIVE Sensitive     CEFAZOLIN <=4 SENSITIVE Sensitive     CEFEPIME <=0.12 SENSITIVE Sensitive     CEFTRIAXONE <=0.25 SENSITIVE Sensitive     CIPROFLOXACIN <=0.25 SENSITIVE Sensitive     GENTAMICIN <=1 SENSITIVE Sensitive     IMIPENEM <=0.25  SENSITIVE Sensitive     NITROFURANTOIN <=16 SENSITIVE Sensitive     TRIMETH/SULFA <=20 SENSITIVE Sensitive     AMPICILLIN/SULBACTAM 4 SENSITIVE Sensitive     PIP/TAZO <=4 SENSITIVE Sensitive     * >=100,000 COLONIES/mL ESCHERICHIA COLI  Resp Panel by RT-PCR (Flu A&B, Covid) Nasopharyngeal Swab     Status: None   Collection Time: 07/22/21 10:10 AM  Specimen: Nasopharyngeal Swab; Nasopharyngeal(NP) swabs in vial transport medium  Result Value Ref Range Status   SARS Coronavirus 2 by RT PCR NEGATIVE NEGATIVE Final    Comment: (NOTE) SARS-CoV-2 target nucleic acids are NOT DETECTED.  The SARS-CoV-2 RNA is generally detectable in upper respiratory specimens during the acute phase of infection. The lowest concentration of SARS-CoV-2 viral copies this assay can detect is 138 copies/mL. A negative result does not preclude SARS-Cov-2 infection and should not be used as the sole basis for treatment or other patient management decisions. A negative result may occur with  improper specimen collection/handling, submission of specimen other than nasopharyngeal swab, presence of viral mutation(s) within the areas targeted by this assay, and inadequate number of viral copies(<138 copies/mL). A negative result must be combined with clinical observations, patient history, and epidemiological information. The expected result is Negative.  Fact Sheet for Patients:  EntrepreneurPulse.com.au  Fact Sheet for Healthcare Providers:  IncredibleEmployment.be  This test is no t yet approved or cleared by the [place] FDA and  has been authorized for detection and/or diagnosis of SARS-CoV-2 by FDA under an Emergency Use Authorization (EUA). This EUA will remain  in effect (meaning this test can be used) for the duration of the COVID-19 declaration under Section 564(b)(1) of the Act, 21 U.S.C.section 360bbb-3(b)(1), unless the authorization is terminated  or revoked  sooner.       Influenza A by PCR NEGATIVE NEGATIVE Final   Influenza B by PCR NEGATIVE NEGATIVE Final    Comment: (NOTE) The Xpert Xpress SARS-CoV-2/FLU/RSV plus assay is intended as an aid in the diagnosis of influenza from Nasopharyngeal swab specimens and should not be used as a sole basis for treatment. Nasal washings and aspirates are unacceptable for Xpert Xpress SARS-CoV-2/FLU/RSV testing.  Fact Sheet for Patients: EntrepreneurPulse.com.au  Fact Sheet for Healthcare Providers: IncredibleEmployment.be  This test is not yet approved or cleared by the [place] FDA and has been authorized for detection and/or diagnosis of SARS-CoV-2 by FDA under an Emergency Use Authorization (EUA). This EUA will remain in effect (meaning this test can be used) for the duration of the COVID-19 declaration under Section 564(b)(1) of the Act, 21 U.S.C. section 360bbb-3(b)(1), unless the authorization is terminated or revoked.  Performed at [facility], [address], [place], [place] [address]   Culture, blood (Routine X 2) w Reflex to ID Panel     Status: None (Preliminary result)   Collection Time: 07/22/21  5:28 PM   Specimen: BLOOD RIGHT ARM  Result Value Ref Range Status   Specimen Description BLOOD RIGHT ARM  Final   Special Requests   Final    BOTTLES DRAWN AEROBIC AND ANAEROBIC Blood Culture adequate volume   Culture   Final    NO GROWTH 2 DAYS Performed at [facility], [address] [place], [place] [address]    Report Status PENDING  Incomplete  Culture, blood (Routine X 2) w Reflex to ID Panel     Status: None (Preliminary result)   Collection Time: 07/22/21  5:28 PM   Specimen: BLOOD LEFT ARM  Result Value Ref Range Status   Specimen Description BLOOD LEFT ARM  Final   Special Requests   Final    BOTTLES DRAWN AEROBIC AND ANAEROBIC Blood Culture adequate volume   Culture   Final    NO GROWTH 2 DAYS Performed at [facility], [address], [place], [place] [address]    Report Status PENDING  Incomplete  Surgical pcr  screen     Status: None   Collection Time: 07/23/21 12:33 AM   Specimen: Nasal Mucosa; Nasal Swab  Result Value Ref Range Status   MRSA, PCR NEGATIVE NEGATIVE Final   Staphylococcus aureus NEGATIVE NEGATIVE Final    Comment: (NOTE) The Xpert SA Assay (FDA approved for NASAL specimens in patients 31 years of age and older), is one component of a comprehensive surveillance program. It is not intended to diagnose infection nor to guide or monitor treatment. Performed at [facility], [address] [place], [place] [address]     Today   Subjective    Stefanie Farmer today has no new complaints   No Nausea, Vomiting or Diarrhea  -- Voiding well without hematuria No fever  Or chills            Patient has been seen and examined prior to discharge   Objective   Blood pressure 133/67, pulse (!) 107, temperature 100.1 F (37.8 C), temperature source Oral, resp. rate 18, height 5\' 3"  (1.6 m), weight 130 kg, last menstrual period 07/05/2021, SpO2 92 %.   Intake/Output Summary (Last 24 hours) at 07/24/2021 1617 Last data filed at 07/24/2021 0900 Gross per 24 hour  Intake 720 ml  Output 900 ml  Net -180 ml    Exam Gen:- Awake Alert, no acute distress  HEENT:- [place].AT, No sclera icterus Neck-Supple Neck,No JVD,.  Lungs-  CTAB , good air movement bilaterally CV- S1, S2 normal, regular Abd-  +ve B.Sounds, Abd Soft, No tenderness, increased truncal adiposity, no CVA area tenderness Extremity/Skin:- No  edema,   good pulses Psych-affect is appropriate, oriented x3 Neuro-no new focal deficits, no tremors =   Data Review   CBC w Diff:  Lab Results  Component Value Date   WBC 10.9 (H) 07/24/2021   HGB 9.6 (L) 07/24/2021   HCT 30.0 (L) 07/24/2021   PLT 201 07/24/2021   LYMPHOPCT 29 06/23/2020   MONOPCT 7 06/23/2020   EOSPCT 3 06/23/2020   BASOPCT 0 06/23/2020    CMP:  Lab  Results  Component Value Date   NA 135 07/23/2021   K 3.5 07/23/2021   CL 108 07/23/2021   CO2 21 (L) 07/23/2021   BUN 8 07/23/2021   CREATININE 0.87 07/23/2021  .  Total Discharge time is about 33 minutes  Roxan Hockey M.D on 07/24/2021 at 4:17 PM  Go to [email or web address] -  for contact info  [facility] - Office  [phone]

## 2021-07-27 ENCOUNTER — Other Ambulatory Visit: Payer: Self-pay | Admitting: Urology

## 2021-07-27 DIAGNOSIS — N201 Calculus of ureter: Secondary | ICD-10-CM

## 2021-07-27 LAB — CULTURE, BLOOD (ROUTINE X 2)
Culture: NO GROWTH
Culture: NO GROWTH
Special Requests: ADEQUATE
Special Requests: ADEQUATE

## 2021-07-27 NOTE — Progress Notes (Signed)
Surgical Physician Order [facility]   * Scheduling expectation :  08/13/2021  *Length of Case: 30 minutes  *MD Preforming Case: Wilkie Aye, MD  *Assistant Needed: no  *Facility Preference: [facility]  *Clearance needed: no  *Anticoagulation Instructions: Hold all anticoagulants  *Aspirin Instructions: Ok to continue Aspirin  -Admit type: OUTpatient  -Anesthesia: General  -Use Standing Orders:  NA  *Diagnosis: Right Ureteral Stone  *Procedure: right  Ureteroscopy w/laser lithotripsy & stent exchange (24825)  Additional orders: N/A  -Equipment: C-Arm, laser -VTE Prophylaxis Standing Order SCDs       Other:   -Standing Lab Orders Per Anesthesia    Lab other: None  -Standing Test orders EKG/Chest x-ray per Anesthesia       Test other:   - Medications:   rocephin 2g  -Other orders:  Ok to proceed with Ancef PCN allergy reviewed  *Post-op visit Date/Instructions:  1 week cysto stent removal

## 2021-07-28 ENCOUNTER — Telehealth: Payer: Self-pay

## 2021-07-28 ENCOUNTER — Encounter (HOSPITAL_COMMUNITY): Payer: Self-pay | Admitting: Urology

## 2021-07-28 NOTE — Telephone Encounter (Signed)
I spoke with Stefanie Farmer and her Mom. We have discussed possible surgery dates and Thursday March 16th, 2023 was agreed upon by all parties. Patient given information about surgery date, what to expect pre-operatively and post operatively.   We discussed that a pre-op nurse will be calling to set up the pre-op visit that will take place prior to surgery. Informed patient that our office will communicate any additional care to be provided after surgery.   Patients questions or concerns were discussed during our call. Advised to call our office should there be any additional information, questions or concerns that arise. Patient verbalized understanding.

## 2021-07-28 NOTE — Progress Notes (Signed)
[facility] [place] Surgery Posting Form   Surgery Date/Time: Date: 08/13/2021  Surgeon: Dr. Wilkie Aye, MD  Surgery Location: Day Surgery  Inpt ( No  )   Outpt (Yes)   Obs ( No  )   Diagnosis: N20.1 Right Ureteral Stone  -CPT: [phone]  Surgery: Right Ureteroscopy with laser lithotripsy and stent exchange  Stop Anticoagulations: Yes, may continue ASA  Cardiac/Medical/Pulmonary Clearance needed: no  *Orders entered into EPIC  Date: 07/28/21   *Case booked in [place]  Date: 07/28/21  *Notified pt of Surgery: Date: 07/28/21  PRE-OP UA & CX: no  *Placed into Prior Authorization Work [place] Date: 07/28/21   Assistant/laser/rep:No

## 2021-08-10 NOTE — Patient Instructions (Signed)
Stefanie Farmer  08/10/2021     @PREFPERIOPPHARMACY @   Your procedure is scheduled on 08/13/2021.   Report to 08/15/2021 at  0830  A.M.   Call this number if you have problems the morning of surgery:  [phone]   Remember:  Do not eat or drink after midnight.      Take these medicines the morning of surgery with A SIP OF WATER    None    Do not wear jewelry, make-up or nail polish.  Do not wear lotions, powders, or perfumes, or deodorant.  Do not shave 48 hours prior to surgery.  Men may shave face and neck.  Do not bring valuables to the hospital.  [facility] is not responsible for any belongings or valuables.  Contacts, dentures or bridgework may not be worn into surgery.  Leave your suitcase in the car.  After surgery it may be brought to your room.  For patients admitted to the hospital, discharge time will be determined by your treatment team.  Patients discharged the day of surgery will not be allowed to drive home and must have someone with them for 24 hours.    Special instructions:   DO NOT smoke tobacco or vape for 24 hours before your procedure.  Please read over the following fact sheets that you were given. Coughing and Deep Breathing, Surgical Site Infection Prevention, Anesthesia Post-op Instructions, and Care and Recovery After Surgery      Ureteral Stent Implantation, Care After This sheet gives you information about how to care for yourself after your procedure. Your health care provider may also give you more specific instructions. If you have problems or questions, contact your health care provider. What can I expect after the procedure? After the procedure, it is common to have: Nausea. Mild pain when you urinate. You may feel this pain in your lower back or lower abdomen. The pain should stop within a few minutes after you urinate. This may last for up to 1 week. A small amount of blood in your urine for several days. Follow  these instructions at home: Medicines Take over-the-counter and prescription medicines only as told by your health care provider. If you were prescribed an antibiotic medicine, take it as told by your health care provider. Do not stop taking the antibiotic even if you start to feel better. Do not drive for 24 hours if you were given a sedative during your procedure. Ask your health care provider if the medicine prescribed to you requires you to avoid driving or using heavy machinery. Activity Rest as told by your health care provider. Avoid sitting for a long time without moving. Get up to take short walks every 1-2 hours. This is important to improve blood flow and breathing. Ask for help if you feel weak or unsteady. Return to your normal activities as told by your health care provider. Ask your health care provider what activities are safe for you. General instructions  Watch for any blood in your urine. Call your health care provider if the amount of blood in your urine increases. If you have a catheter: Follow instructions from your health care provider about taking care of your catheter and collection bag. Do not take baths, swim, or use a hot tub until your health care provider approves. Ask your health care provider if you may take showers. You may only be allowed to take sponge baths. Drink enough fluid to keep your  urine pale yellow. Do not use any products that contain nicotine or tobacco, such as cigarettes, e-cigarettes, and chewing tobacco. These can delay healing after surgery. If you need help quitting, ask your health care provider. Keep all follow-up visits as told by your health care provider. This is important. Contact a health care provider if: You have pain that gets worse or does not get better with medicine, especially pain when you urinate. You have difficulty urinating. You feel nauseous or you vomit repeatedly during a period of more than 2 days after the  procedure. Get help right away if: Your urine is dark red or has blood clots in it. You are leaking urine (have incontinence). The end of the stent comes out of your urethra. You cannot urinate. You have sudden, sharp, or severe pain in your abdomen or lower back. You have a fever. You have swelling or pain in your legs. You have difficulty breathing. Summary After the procedure, it is common to have mild pain when you urinate that goes away within a few minutes after you urinate. This may last for up to 1 week. Watch for any blood in your urine. Call your health care provider if the amount of blood in your urine increases. Take over-the-counter and prescription medicines only as told by your health care provider. Drink enough fluid to keep your urine pale yellow. This information is not intended to replace advice given to you by your health care provider. Make sure you discuss any questions you have with your health care provider. Document Revised: 02/21/2018 Document Reviewed: 02/22/2018 Elsevier Patient Education  2022 [employer] General Anesthesia, Adult, Care After This sheet gives you information about how to care for yourself after your procedure. Your health care provider may also give you more specific instructions. If you have problems or questions, contact your health care provider. What can I expect after the procedure? After the procedure, the following side effects are common: Pain or discomfort at the IV site. Nausea. Vomiting. Sore throat. Trouble concentrating. Feeling cold or chills. Feeling weak or tired. Sleepiness and fatigue. Soreness and body aches. These side effects can affect parts of the body that were not involved in surgery. Follow these instructions at home: For the time period you were told by your health care provider:  Rest. Do not participate in activities where you could fall or become injured. Do not drive or use machinery. Do not drink  alcohol. Do not take sleeping pills or medicines that cause drowsiness. Do not make important decisions or sign legal documents. Do not take care of children on your own. Eating and drinking Follow any instructions from your health care provider about eating or drinking restrictions. When you feel hungry, start by eating small amounts of foods that are soft and easy to digest (bland), such as toast. Gradually return to your regular diet. Drink enough fluid to keep your urine pale yellow. If you vomit, rehydrate by drinking water, juice, or clear broth. General instructions If you have sleep apnea, surgery and certain medicines can increase your risk for breathing problems. Follow instructions from your health care provider about wearing your sleep device: Anytime you are sleeping, including during daytime naps. While taking prescription pain medicines, sleeping medicines, or medicines that make you drowsy. Have a responsible adult stay with you for the time you are told. It is important to have someone help care for you until you are awake and alert. Return to your normal activities as told by  your health care provider. Ask your health care provider what activities are safe for you. Take over-the-counter and prescription medicines only as told by your health care provider. If you smoke, do not smoke without supervision. Keep all follow-up visits as told by your health care provider. This is important. Contact a health care provider if: You have nausea or vomiting that does not get better with medicine. You cannot eat or drink without vomiting. You have pain that does not get better with medicine. You are unable to pass urine. You develop a skin rash. You have a fever. You have redness around your IV site that gets worse. Get help right away if: You have difficulty breathing. You have chest pain. You have blood in your urine or stool, or you vomit blood. Summary After the procedure, it  is common to have a sore throat or nausea. It is also common to feel tired. Have a responsible adult stay with you for the time you are told. It is important to have someone help care for you until you are awake and alert. When you feel hungry, start by eating small amounts of foods that are soft and easy to digest (bland), such as toast. Gradually return to your regular diet. Drink enough fluid to keep your urine pale yellow. Return to your normal activities as told by your health care provider. Ask your health care provider what activities are safe for you. This information is not intended to replace advice given to you by your health care provider. Make sure you discuss any questions you have with your health care provider. Document Revised: 01/31/2020 Document Reviewed: 08/30/2019 Elsevier Patient Education  2022 [employer] How to Use Chlorhexidine for Bathing Chlorhexidine gluconate (CHG) is a germ-killing (antiseptic) solution that is used to clean the skin. It can get rid of the bacteria that normally live on the skin and can keep them away for about 24 hours. To clean your skin with CHG, you may be given: A CHG solution to use in the shower or as part of a sponge bath. A prepackaged cloth that contains CHG. Cleaning your skin with CHG may help lower the risk for infection: While you are staying in the intensive care unit of the hospital. If you have a vascular access, such as a central line, to provide short-term or long-term access to your veins. If you have a catheter to drain urine from your bladder. If you are on a ventilator. A ventilator is a machine that helps you breathe by moving air in and out of your lungs. After surgery. What are the risks? Risks of using CHG include: A skin reaction. Hearing loss, if CHG gets in your ears and you have a perforated eardrum. Eye injury, if CHG gets in your eyes and is not rinsed out. The CHG product catching fire. Make sure that you  avoid smoking and flames after applying CHG to your skin. Do not use CHG: If you have a chlorhexidine allergy or have previously reacted to chlorhexidine. On babies younger than 28 months of age. How to use CHG solution Use CHG only as told by your health care provider, and follow the instructions on the label. Use the full amount of CHG as directed. Usually, this is one bottle. During a shower Follow these steps when using CHG solution during a shower (unless your health care provider gives you different instructions): Start the shower. Use your normal soap and shampoo to wash your face and hair. Turn off  the shower or move out of the shower stream. Pour the CHG onto a clean washcloth. Do not use any type of brush or rough-edged sponge. Starting at your neck, lather your body down to your toes. Make sure you follow these instructions: If you will be having surgery, pay special attention to the part of your body where you will be having surgery. Scrub this area for at least 1 minute. Do not use CHG on your head or face. If the solution gets into your ears or eyes, rinse them well with water. Avoid your genital area. Avoid any areas of skin that have broken skin, cuts, or scrapes. Scrub your back and under your arms. Make sure to wash skin folds. Let the lather sit on your skin for 1-2 minutes or as long as told by your health care provider. Thoroughly rinse your entire body in the shower. Make sure that all body creases and crevices are rinsed well. Dry off with a clean towel. Do not put any substances on your body afterward--such as powder, lotion, or perfume--unless you are told to do so by your health care provider. Only use lotions that are recommended by the manufacturer. Put on clean clothes or pajamas. If it is the night before your surgery, sleep in clean sheets.  During a sponge bath Follow these steps when using CHG solution during a sponge bath (unless your health care provider  gives you different instructions): Use your normal soap and shampoo to wash your face and hair. Pour the CHG onto a clean washcloth. Starting at your neck, lather your body down to your toes. Make sure you follow these instructions: If you will be having surgery, pay special attention to the part of your body where you will be having surgery. Scrub this area for at least 1 minute. Do not use CHG on your head or face. If the solution gets into your ears or eyes, rinse them well with water. Avoid your genital area. Avoid any areas of skin that have broken skin, cuts, or scrapes. Scrub your back and under your arms. Make sure to wash skin folds. Let the lather sit on your skin for 1-2 minutes or as long as told by your health care provider. Using a different clean, wet washcloth, thoroughly rinse your entire body. Make sure that all body creases and crevices are rinsed well. Dry off with a clean towel. Do not put any substances on your body afterward--such as powder, lotion, or perfume--unless you are told to do so by your health care provider. Only use lotions that are recommended by the manufacturer. Put on clean clothes or pajamas. If it is the night before your surgery, sleep in clean sheets. How to use CHG prepackaged cloths Only use CHG cloths as told by your health care provider, and follow the instructions on the label. Use the CHG cloth on clean, dry skin. Do not use the CHG cloth on your head or face unless your health care provider tells you to. When washing with the CHG cloth: Avoid your genital area. Avoid any areas of skin that have broken skin, cuts, or scrapes. Before surgery Follow these steps when using a CHG cloth to clean before surgery (unless your health care provider gives you different instructions): Using the CHG cloth, vigorously scrub the part of your body where you will be having surgery. Scrub using a back-and-forth motion for 3 minutes. The area on your body should  be completely wet with CHG when you are  done scrubbing. Do not rinse. Discard the cloth and let the area air-dry. Do not put any substances on the area afterward, such as powder, lotion, or perfume. Put on clean clothes or pajamas. If it is the night before your surgery, sleep in clean sheets.  For general bathing Follow these steps when using CHG cloths for general bathing (unless your health care provider gives you different instructions). Use a separate CHG cloth for each area of your body. Make sure you wash between any folds of skin and between your fingers and toes. Wash your body in the following order, switching to a new cloth after each step: The front of your neck, shoulders, and chest. Both of your arms, under your arms, and your hands. Your stomach and groin area, avoiding the genitals. Your right leg and foot. Your left leg and foot. The back of your neck, your back, and your buttocks. Do not rinse. Discard the cloth and let the area air-dry. Do not put any substances on your body afterward--such as powder, lotion, or perfume--unless you are told to do so by your health care provider. Only use lotions that are recommended by the manufacturer. Put on clean clothes or pajamas. Contact a health care provider if: Your skin gets irritated after scrubbing. You have questions about using your solution or cloth. You swallow any chlorhexidine. Call your local poison control center [phone] in the [place] Get help right away if: Your eyes itch badly, or they become very red or swollen. Your skin itches badly and is red or swollen. Your hearing changes. You have trouble seeing. You have swelling or tingling in your mouth or throat. You have trouble breathing. These symptoms may represent a serious problem that is an emergency. Do not wait to see if the symptoms will go away. Get medical help right away. Call your local emergency services (911 in the [place] Do not drive yourself to  the hospital. Summary Chlorhexidine gluconate (CHG) is a germ-killing (antiseptic) solution that is used to clean the skin. Cleaning your skin with CHG may help to lower your risk for infection. You may be given CHG to use for bathing. It may be in a bottle or in a prepackaged cloth to use on your skin. Carefully follow your health care provider's instructions and the instructions on the product label. Do not use CHG if you have a chlorhexidine allergy. Contact your health care provider if your skin gets irritated after scrubbing. This information is not intended to replace advice given to you by your health care provider. Make sure you discuss any questions you have with your health care provider. Document Revised: 07/28/2020 Document Reviewed: 07/28/2020 Elsevier Patient Education  2022 [employer].

## 2021-08-11 ENCOUNTER — Encounter (HOSPITAL_COMMUNITY): Payer: Self-pay

## 2021-08-11 ENCOUNTER — Encounter (HOSPITAL_COMMUNITY)
Admission: RE | Admit: 2021-08-11 | Discharge: 2021-08-11 | Disposition: A | Discharge status: Home / Self Care | Payer: Medicaid Other | Source: Ambulatory Visit | Attending: Urology | Admitting: Urology

## 2021-08-11 VITALS — BP 122/53 | HR 86 | Temp 98.0°F | Resp 18 | Ht 63.0 in | Wt 303.0 lb

## 2021-08-11 DIAGNOSIS — Z01818 Encounter for other preprocedural examination: Secondary | ICD-10-CM

## 2021-08-11 DIAGNOSIS — N200 Calculus of kidney: Secondary | ICD-10-CM | POA: Diagnosis not present

## 2021-08-11 DIAGNOSIS — Z01812 Encounter for preprocedural laboratory examination: Secondary | ICD-10-CM | POA: Diagnosis present

## 2021-08-11 LAB — URINALYSIS, ROUTINE W REFLEX MICROSCOPIC
Bilirubin Urine: NEGATIVE
Glucose, UA: NEGATIVE mg/dL
Ketones, ur: NEGATIVE mg/dL
Nitrite: NEGATIVE
Protein, ur: 100 mg/dL — AB
RBC / HPF: >50 RBC/hpf — ABNORMAL HIGH (ref 0–5)
Specific Gravity, Urine: 1.019 (ref 1.005–1.030)
pH: 5 (ref 5.0–8.0)

## 2021-08-11 LAB — PREGNANCY, URINE: Preg Test, Ur: NEGATIVE

## 2021-08-12 ENCOUNTER — Encounter: Payer: Self-pay | Admitting: Urology

## 2021-08-13 ENCOUNTER — Ambulatory Visit (HOSPITAL_COMMUNITY): Payer: 59

## 2021-08-13 ENCOUNTER — Encounter (HOSPITAL_COMMUNITY)
Admission: RE | Disposition: A | Discharge status: Home / Self Care | Payer: Self-pay | Source: Home / Self Care | Attending: Urology

## 2021-08-13 ENCOUNTER — Ambulatory Visit (HOSPITAL_COMMUNITY): Payer: 59 | Admitting: Certified Registered Nurse Anesthetist

## 2021-08-13 ENCOUNTER — Encounter (HOSPITAL_COMMUNITY): Payer: Self-pay | Admitting: Urology

## 2021-08-13 ENCOUNTER — Ambulatory Visit (HOSPITAL_COMMUNITY)
Admission: RE | Admit: 2021-08-13 | Discharge: 2021-08-13 | Disposition: A | Discharge status: Home / Self Care | Payer: 59 | Attending: Urology | Admitting: Urology

## 2021-08-13 ENCOUNTER — Ambulatory Visit (HOSPITAL_BASED_OUTPATIENT_CLINIC_OR_DEPARTMENT_OTHER): Payer: 59 | Admitting: Certified Registered Nurse Anesthetist

## 2021-08-13 DIAGNOSIS — N201 Calculus of ureter: Secondary | ICD-10-CM

## 2021-08-13 DIAGNOSIS — Z6841 Body Mass Index (BMI) 40.0 and over, adult: Secondary | ICD-10-CM | POA: Diagnosis not present

## 2021-08-13 DIAGNOSIS — Z87891 Personal history of nicotine dependence: Secondary | ICD-10-CM | POA: Diagnosis not present

## 2021-08-13 HISTORY — PX: HOLMIUM LASER APPLICATION: SHX5852

## 2021-08-13 HISTORY — PX: CYSTOSCOPY W/ URETERAL STENT PLACEMENT: SHX1429

## 2021-08-13 SURGERY — CYSTOSCOPY, WITH RETROGRADE PYELOGRAM AND URETERAL STENT INSERTION
Anesthesia: General | Site: Ureter | Laterality: Right

## 2021-08-13 MED ORDER — OXYCODONE-ACETAMINOPHEN 7.5-325 MG PO TABS
ORAL_TABLET | ORAL | Status: AC
  Filled 2021-08-13: qty 1

## 2021-08-13 MED ORDER — ORAL CARE MOUTH RINSE: 15.0000 mL | Freq: Once | OROMUCOSAL | Status: AC

## 2021-08-13 MED ORDER — SUGAMMADEX SODIUM 500 MG/5ML IV SOLN
INTRAVENOUS | Status: AC
  Filled 2021-08-13: qty 5

## 2021-08-13 MED ORDER — OXYCODONE-ACETAMINOPHEN 7.5-325 MG PO TABS
1.0000 | ORAL_TABLET | Freq: Once | ORAL | Status: AC
  Administered 2021-08-13: 1 via ORAL

## 2021-08-13 MED ORDER — SODIUM CHLORIDE 0.9 % IV SOLN: INTRAVENOUS | Status: DC | PRN

## 2021-08-13 MED ORDER — LIDOCAINE HCL (PF) 2 % IJ SOLN
INTRAMUSCULAR | Status: AC
  Filled 2021-08-13: qty 5

## 2021-08-13 MED ORDER — DIATRIZOATE MEGLUMINE 30 % UR SOLN
URETHRAL | Status: AC
  Filled 2021-08-13: qty 100

## 2021-08-13 MED ORDER — SODIUM CHLORIDE 0.9 % IV SOLN
INTRAVENOUS | Status: AC
  Filled 2021-08-13: qty 20

## 2021-08-13 MED ORDER — ONDANSETRON HCL 4 MG/2ML IJ SOLN: 4.0000 mg | Freq: Once | INTRAMUSCULAR | Status: DC | PRN

## 2021-08-13 MED ORDER — DEXAMETHASONE SODIUM PHOSPHATE 10 MG/ML IJ SOLN
INTRAMUSCULAR | Status: AC
  Filled 2021-08-13: qty 1

## 2021-08-13 MED ORDER — LACTATED RINGERS IV SOLN: INTRAVENOUS | Status: DC

## 2021-08-13 MED ORDER — ONDANSETRON HCL 4 MG/2ML IJ SOLN
INTRAMUSCULAR | Status: DC | PRN
  Administered 2021-08-13: 4 mg via INTRAVENOUS

## 2021-08-13 MED ORDER — DIATRIZOATE MEGLUMINE 30 % UR SOLN
URETHRAL | Status: DC | PRN
  Administered 2021-08-13: 8 mL via URETHRAL

## 2021-08-13 MED ORDER — FENTANYL CITRATE (PF) 100 MCG/2ML IJ SOLN
INTRAMUSCULAR | Status: DC | PRN
  Administered 2021-08-13: 100 ug via INTRAVENOUS

## 2021-08-13 MED ORDER — SUCCINYLCHOLINE CHLORIDE 200 MG/10ML IV SOSY
PREFILLED_SYRINGE | INTRAVENOUS | Status: DC | PRN
Start: 2021-08-13 — End: 2021-08-13
  Administered 2021-08-13: 140 mg via INTRAVENOUS

## 2021-08-13 MED ORDER — DEXAMETHASONE SODIUM PHOSPHATE 10 MG/ML IJ SOLN
INTRAMUSCULAR | Status: DC | PRN
  Administered 2021-08-13: 5 mg via INTRAVENOUS

## 2021-08-13 MED ORDER — PROPOFOL 10 MG/ML IV BOLUS
INTRAVENOUS | Status: AC
  Filled 2021-08-13: qty 20

## 2021-08-13 MED ORDER — SODIUM CHLORIDE 0.9 % IV SOLN
2.0000 g | INTRAVENOUS | Status: AC
  Administered 2021-08-13: 2 g via INTRAVENOUS

## 2021-08-13 MED ORDER — ONDANSETRON HCL 4 MG/2ML IJ SOLN
INTRAMUSCULAR | Status: AC
  Filled 2021-08-13: qty 2

## 2021-08-13 MED ORDER — MIDAZOLAM HCL 2 MG/2ML IJ SOLN
INTRAMUSCULAR | Status: DC | PRN
  Administered 2021-08-13: 2 mg via INTRAVENOUS

## 2021-08-13 MED ORDER — LIDOCAINE HCL (CARDIAC) PF 100 MG/5ML IV SOSY
PREFILLED_SYRINGE | INTRAVENOUS | Status: DC | PRN
  Administered 2021-08-13: 40 mg via INTRATRACHEAL

## 2021-08-13 MED ORDER — PROPOFOL 10 MG/ML IV BOLUS
INTRAVENOUS | Status: DC | PRN
Start: 2021-08-13 — End: 2021-08-13
  Administered 2021-08-13: 200 mg via INTRAVENOUS

## 2021-08-13 MED ORDER — FENTANYL CITRATE PF 50 MCG/ML IJ SOSY: 25.0000 ug | PREFILLED_SYRINGE | INTRAMUSCULAR | Status: DC | PRN

## 2021-08-13 MED ORDER — ROCURONIUM BROMIDE 10 MG/ML (PF) SYRINGE
PREFILLED_SYRINGE | INTRAVENOUS | Status: DC | PRN
  Administered 2021-08-13: 30 mg via INTRAVENOUS

## 2021-08-13 MED ORDER — MIDAZOLAM HCL 2 MG/2ML IJ SOLN
INTRAMUSCULAR | Status: AC
  Filled 2021-08-13: qty 2

## 2021-08-13 MED ORDER — STERILE WATER FOR IRRIGATION IR SOLN
Status: DC | PRN
  Administered 2021-08-13: 500 mL

## 2021-08-13 MED ORDER — CHLORHEXIDINE GLUCONATE 0.12 % MT SOLN
15.0000 mL | Freq: Once | OROMUCOSAL | Status: AC
  Administered 2021-08-13: 15 mL via OROMUCOSAL

## 2021-08-13 MED ORDER — OXYCODONE-ACETAMINOPHEN 7.5-325 MG PO TABS
1.0000 | ORAL_TABLET | Freq: Three times a day (TID) | ORAL | 0 refills | Status: DC | PRN
Start: 2021-08-13 — End: 2021-09-15

## 2021-08-13 MED ORDER — SODIUM CHLORIDE 0.9 % IR SOLN
Status: DC | PRN
  Administered 2021-08-13 (×2): 3000 mL

## 2021-08-13 MED ORDER — FENTANYL CITRATE (PF) 100 MCG/2ML IJ SOLN
INTRAMUSCULAR | Status: AC
  Filled 2021-08-13: qty 2

## 2021-08-13 MED ORDER — SUGAMMADEX SODIUM 500 MG/5ML IV SOLN
INTRAVENOUS | Status: DC | PRN
  Administered 2021-08-13: 300 mg via INTRAVENOUS

## 2021-08-13 SURGICAL SUPPLY — 21 items
BAG DRAIN URO TABLE W/ADPT NS (BAG) ×2 IMPLANT
BAG DRN 8 ADPR NS SKTRN CSTL (BAG) ×1
CATH INTERMIT  6FR 70CM (CATHETERS) ×2 IMPLANT
CLOTH BEACON ORANGE TIMEOUT ST (SAFETY) ×2 IMPLANT
DECANTER SPIKE VIAL GLASS SM (MISCELLANEOUS) ×2 IMPLANT
EXTRACTOR STONE NITINOL NGAGE (UROLOGICAL SUPPLIES) ×1 IMPLANT
FIBER LASER FLEXIVA 200 (UROLOGICAL SUPPLIES) ×1 IMPLANT
GLOVE SURG POLYISO LF SZ8 (GLOVE) ×2 IMPLANT
GLOVE SURG UNDER POLY LF SZ7 (GLOVE) ×4 IMPLANT
GOWN STRL REUS W/TWL LRG LVL3 (GOWN DISPOSABLE) ×2 IMPLANT
GOWN STRL REUS W/TWL XL LVL3 (GOWN DISPOSABLE) ×2 IMPLANT
GUIDEWIRE STR ZIPWIRE 035X150 (MISCELLANEOUS) ×2 IMPLANT
IV NS IRRIG 3000ML ARTHROMATIC (IV SOLUTION) ×3 IMPLANT
KIT TURNOVER CYSTO (KITS) ×2 IMPLANT
MANIFOLD NEPTUNE II (INSTRUMENTS) ×2 IMPLANT
PACK CYSTO (CUSTOM PROCEDURE TRAY) ×2 IMPLANT
PAD ARMBOARD 7.5X6 YLW CONV (MISCELLANEOUS) ×2 IMPLANT
STENT CONTOUR 6FRX24X.038 (STENTS) ×1 IMPLANT
SYR 10ML LL (SYRINGE) ×2 IMPLANT
TOWEL OR 17X26 4PK STRL BLUE (TOWEL DISPOSABLE) ×2 IMPLANT
WATER STERILE IRR 500ML POUR (IV SOLUTION) ×2 IMPLANT

## 2021-08-13 NOTE — Transfer of Care (Signed)
Immediate Anesthesia Transfer of Care Note ? ?Patient: Stefanie Farmer ? ?Procedure(s) Performed: CYSTOSCOPY WITH RETROGRADE PYELOGRAM/URETERAL STENT EXCHANGE (Right: Ureter) ?HOLMIUM LASER APPLICATION (Right: Ureter) ? ?Patient Location: PACU ? ?Anesthesia Type:General ? ?Level of Consciousness: awake, alert  and oriented ? ?Airway & Oxygen Therapy: Patient Spontanous Breathing and Patient connected to nasal cannula oxygen ? ?Post-op Assessment: Report given to RN and Post -op Vital signs reviewed and stable ? ?Post vital signs: Reviewed and stable ? ?Last Vitals:  ?Vitals Value Taken Time  ?BP 125/70   ?Temp    ?Pulse 79 08/13/21 1014  ?Resp 19   ?SpO2 100 % 08/13/21 1014  ?Vitals shown include unvalidated device data. ? ?Last Pain:  ?Vitals:  ? 08/13/21 0856  ?PainSc: 2   ?   ? ?  ? ?Complications: No notable events documented. ?

## 2021-08-13 NOTE — Anesthesia Preprocedure Evaluation (Signed)
Anesthesia Evaluation  ?Patient identified by MRN, date of birth, ID band ?Patient awake ? ? ? ?Reviewed: ?Allergy & Precautions, H&P , NPO status , Patient's Chart, lab work & pertinent test results, reviewed documented beta blocker date and time  ? ?Airway ?Mallampati: II ? ?TM Distance: >3 FB ?Neck ROM: full ? ? ? Dental ?no notable dental hx. ?(+) Dental Advisory Given, Chipped,  ?  ?Pulmonary ?neg pulmonary ROS, Patient abstained from smoking., former smoker,  ?  ?Pulmonary exam normal ?breath sounds clear to auscultation ? ? ? ? ? ? Cardiovascular ?Exercise Tolerance: Good ?negative cardio ROS ?Normal cardiovascular exam ?Rhythm:regular Rate:Normal ? ? ?  ?Neuro/Psych ?negative neurological ROS ? negative psych ROS  ? GI/Hepatic ?negative GI ROS, Neg liver ROS, (+)  ?  ?  ? marijuana use,   ?Endo/Other  ?Morbid obesity ? Renal/GU ?Renal disease  ?negative genitourinary ?  ?Musculoskeletal ?negative musculoskeletal ROS ?(+)  ? Abdominal ?  ?Peds ?negative pediatric ROS ?(+)  Hematology ?negative hematology ROS ?(+)   ?Anesthesia Other Findings ? ? Reproductive/Obstetrics ?negative OB ROS ? ?  ? ? ? ? ? ? ? ? ? ? ? ? ? ?  ?  ? ? ? ? ? ? ? ? ?Anesthesia Physical ? ?Anesthesia Plan ? ?ASA: 3 ? ?Anesthesia Plan: General and General LMA  ? ?Post-op Pain Management:   ? ?Induction: Intravenous ? ?PONV Risk Score and Plan: 4 or greater and Ondansetron ? ?Airway Management Planned: Oral ETT ? ?Additional Equipment:  ? ?Intra-op Plan:  ? ?Post-operative Plan: Extubation in OR ? ?Informed Consent: I have reviewed the patients History and Physical, chart, labs and discussed the procedure including the risks, benefits and alternatives for the proposed anesthesia with the patient or authorized representative who has indicated his/her understanding and acceptance.  ? ? ? ?Dental Advisory Given ? ?Plan Discussed with: CRNA ? ?Anesthesia Plan Comments:   ? ? ? ? ? ? ?Anesthesia Quick  Evaluation ? ?

## 2021-08-13 NOTE — Op Note (Signed)
Preoperative diagnosis: Right ureteral stone ? ?Postoperative diagnosis: Same ? ?Procedure: 1 cystoscopy ?2 right retrograde pyelography ?3.  Intraoperative fluoroscopy, under one hour, with interpretation ?4.  Right ureteroscopic stone manipulation with laser lithotripsy ?5.  Right 6 x 26 JJ stent placement ? ?Attending: Nicolette Bang ? ?Anesthesia: General ? ?Estimated blood loss: None ? ?Drains: Right 6 x 26 JJ ureteral stent with tether ? ?Specimens: stone for analysis ? ?Antibiotics: rocephin ? ?Findings: 71mm mid ureteral calculus. No right hydronephrosis. ? ?Indications: Patient is a 26 year old female with a history of a right ureteral stone who underwent right ureteral stent placement 3 weeks ago for sepsis from a right ureteral calculus.  After discussing treatment options, she decided proceed with right ureteroscopic stone manipulation. ? ?Procedure in detail: The patient was brought to the operating room and a brief timeout was done to ensure correct patient, correct procedure, correct site.  General anesthesia was administered patient was placed in dorsal lithotomy position.  Her genitalia was then prepped and draped in usual sterile fashion.  A rigid 72 French cystoscope was passed in the urethra and the bladder.  Bladder was inspected free masses or lesions.  the right ureteral orifices were in the normal orthotopic locations.  a 6 french ureteral catheter was then instilled into the right ureter orifice.  a gentle retrograde was obtained and findings noted above.  Using a grasper the right ureteral stent was brought to the urethral meatus. we then placed a zip wire through the ureteral stent and advanced up to the renal pelvis. The stent was then removed. we then removed the cystoscope and cannulated the right ureteral orifice with a semirigid ureteroscope.  we then encountered the stone in the mid ureter.  using using a 242 nm laser fiber and fragmented the stone into smaller pieces.  the pieces  were then removed with a engage basket.  Once all stone fragments were removed we then placed a 6 x 26 double-j ureteral stent over the original zip wire. We then removed the wire and good coil was noted in the the renal pelvis under fluoroscopy and the bladder under direct vision.     the stone fragments were then removed from the bladder and sent for analysis.   the bladder was then drained and this concluded the procedure which was well tolerated by patient. ? ?Complications: None ? ?Condition: Stable, extubated, transferred to PACU ? ?Plan: Patient is to be discharged home as to follow-up in one week. She is to remove her stent in 72 hours by pulling the tether ? ?

## 2021-08-13 NOTE — Interval H&P Note (Signed)
History and Physical Interval Note: ? ?08/13/2021 ?9:21 AM ? ?Stefanie Farmer  has presented today for surgery, with the diagnosis of Right Ureteral Stone.  The various methods of treatment have been discussed with the patient and family. After consideration of risks, benefits and other options for treatment, the patient has consented to  Procedure(s): ?CYSTOSCOPY WITH RETROGRADE PYELOGRAM/URETERAL STENT EXCHANGE (Right) ?HOLMIUM LASER APPLICATION (Right) as a surgical intervention.  The patient's history has been reviewed, patient examined, no change in status, stable for surgery.  I have reviewed the patient's chart and labs.  Questions were answered to the patient's satisfaction.   ? ? ?Wilkie Aye ? ? ?

## 2021-08-13 NOTE — Anesthesia Postprocedure Evaluation (Signed)
Anesthesia Post Note ? ?Patient: Stefanie Farmer ? ?Procedure(s) Performed: CYSTOSCOPY WITH RETROGRADE PYELOGRAM/URETERAL STENT EXCHANGE (Right: Ureter) ?HOLMIUM LASER APPLICATION (Right: Ureter) ? ?Patient location during evaluation: Phase II ?Anesthesia Type: General ?Level of consciousness: awake ?Pain management: pain level controlled ?Vital Signs Assessment: post-procedure vital signs reviewed and stable ?Respiratory status: spontaneous breathing and respiratory function stable ?Cardiovascular status: blood pressure returned to baseline and stable ?Postop Assessment: no headache and no apparent nausea or vomiting ?Anesthetic complications: no ?Comments: Late entry ? ? ?No notable events documented. ? ? ?Last Vitals:  ?Vitals:  ? 08/13/21 0900 08/13/21 0915  ?BP: 129/85   ?Pulse: 69 75  ?Resp: 18 17  ?Temp:    ?SpO2: 96% 98%  ?  ?Last Pain:  ?Vitals:  ? 08/13/21 0856  ?PainSc: 2   ? ? ?  ?  ?  ?  ?  ?  ? ?Louann Sjogren ? ? ? ? ?

## 2021-08-13 NOTE — Anesthesia Procedure Notes (Addendum)
Procedure Name: Intubation ?Date/Time: 08/13/2021 9:38 AM ?Performed by: Karna Dupes, CRNA ?Pre-anesthesia Checklist: Patient identified, Emergency Drugs available, Suction available and Patient being monitored ?Patient Re-evaluated:Patient Re-evaluated prior to induction ?Oxygen Delivery Method: Circle system utilized ?Preoxygenation: Pre-oxygenation with 100% oxygen ?Induction Type: IV induction ?Ventilation: Mask ventilation without difficulty ?Laryngoscope Size: Mac and 3 ?Grade View: Grade I ?Tube type: Oral ?Tube size: 7.0 mm ?Number of attempts: 1 ?Airway Equipment and Method: Stylet ?Placement Confirmation: ETT inserted through vocal cords under direct vision, positive ETCO2 and breath sounds checked- equal and bilateral ?Secured at: 21 cm ?Tube secured with: Tape ?Dental Injury: Teeth and Oropharynx as per pre-operative assessment  ? ? ? ? ?

## 2021-08-13 NOTE — Telephone Encounter (Signed)
Please review UA.

## 2021-08-14 ENCOUNTER — Encounter (HOSPITAL_COMMUNITY): Payer: Self-pay | Admitting: Urology

## 2021-08-18 LAB — CALCULI, WITH PHOTOGRAPH (CLINICAL LAB)
Calcium Oxalate Dihydrate: 40 %
Calcium Oxalate Monohydrate: 55 %
Hydroxyapatite: 5 %
Weight Calculi: 72 mg

## 2021-08-21 ENCOUNTER — Other Ambulatory Visit: Payer: Self-pay

## 2021-08-21 ENCOUNTER — Ambulatory Visit: Payer: Medicaid Other | Admitting: Urology

## 2021-08-21 ENCOUNTER — Encounter: Payer: Self-pay | Admitting: Urology

## 2021-08-21 ENCOUNTER — Ambulatory Visit (INDEPENDENT_AMBULATORY_CARE_PROVIDER_SITE_OTHER): Payer: 59 | Admitting: Urology

## 2021-08-21 VITALS — BP 126/85 | HR 111

## 2021-08-21 DIAGNOSIS — N201 Calculus of ureter: Secondary | ICD-10-CM | POA: Diagnosis not present

## 2021-08-21 LAB — URINALYSIS, ROUTINE W REFLEX MICROSCOPIC
Bilirubin, UA: NEGATIVE
Glucose, UA: NEGATIVE
Nitrite, UA: NEGATIVE
Protein,UA: NEGATIVE
Specific Gravity, UA: 1.025 (ref 1.005–1.030)
Urobilinogen, Ur: 0.2 mg/dL (ref 0.2–1.0)
pH, UA: 6 (ref 5.0–7.5)

## 2021-08-21 LAB — MICROSCOPIC EXAMINATION
Epithelial Cells (non renal): >10 /hpf — AB (ref 0–10)
Renal Epithel, UA: NONE SEEN /hpf

## 2021-08-21 NOTE — Patient Instructions (Signed)
Dietary Guidelines to Help Prevent Kidney Stones Kidney stones are deposits of minerals and salts that form inside your kidneys. Your risk of developing kidney stones may be greater depending on your diet, your lifestyle, the medicines you take, and whether you have certain medical conditions. Most people can lower their chances of developing kidney stones by following the instructions below. Your dietitian may give you more specific instructions depending on your overall health and the type of kidney stones you tend to develop. What are tips for following this plan? Reading food labels  Choose foods with "no salt added" or "low-salt" labels. Limit your salt (sodium) intake to less than 1,500 mg a day. Choose foods with calcium for each meal and snack. Try to eat about 300 mg of calcium at each meal. Foods that contain 200-500 mg of calcium a serving include: 8 oz (237 mL) of milk, calcium-fortifiednon-dairy milk, and calcium-fortifiedfruit juice. Calcium-fortified means that calcium has been added to these drinks. 8 oz (237 mL) of kefir, yogurt, and soy yogurt. 4 oz (114 g) of tofu. 1 oz (28 g) of cheese. 1 cup (150 g) of dried figs. 1 cup (91 g) of cooked broccoli. One 3 oz (85 g) can of sardines or mackerel. Most people need 1,000-1,500 mg of calcium a day. Talk to your dietitian about how much calcium is recommended for you. Shopping Buy plenty of fresh fruits and vegetables. Most people do not need to avoid fruits and vegetables, even if these foods contain nutrients that may contribute to kidney stones. When shopping for convenience foods, choose: Whole pieces of fruit. Pre-made salads with dressing on the side. Low-fat fruit and yogurt smoothies. Avoid buying frozen meals or prepared deli foods. These can be high in sodium. Look for foods with live cultures, such as yogurt and kefir. Choose high-fiber grains, such as whole-wheat breads, oat bran, and wheat cereals. Cooking Do not add  salt to food when cooking. Place a salt shaker on the table and allow each person to add his or her own salt to taste. Use vegetable protein, such as beans, textured vegetable protein (TVP), or tofu, instead of meat in pasta, casseroles, and soups. Meal planning Eat less salt, if told by your dietitian. To do this: Avoid eating processed or pre-made food. Avoid eating fast food. Eat less animal protein, including cheese, meat, poultry, or fish, if told by your dietitian. To do this: Limit the number of times you have meat, poultry, fish, or cheese each week. Eat a diet free of meat at least 2 days a week. Eat only one serving each day of meat, poultry, fish, or seafood. When you prepare animal protein, cut pieces into small portion sizes. For most meat and fish, one serving is about the size of the palm of your hand. Eat at least five servings of fresh fruits and vegetables each day. To do this: Keep fruits and vegetables on hand for snacks. Eat one piece of fruit or a handful of berries with breakfast. Have a salad and fruit at lunch. Have two kinds of vegetables at dinner. Limit foods that are high in a substance called oxalate. These include: Spinach (cooked), rhubarb, beets, sweet potatoes, and Swiss chard. Peanuts. Potato chips, french fries, and baked potatoes with skin on. Nuts and nut products. Chocolate. If you regularly take a diuretic medicine, make sure to eat at least 1 or 2 servings of fruits or vegetables that are high in potassium each day. These include: Avocado. Banana. Orange, prune,   carrot, or tomato juice. Baked potato. Cabbage. Beans and split peas. Lifestyle  Drink enough fluid to keep your urine pale yellow. This is the most important thing you can do. Spread your fluid intake throughout the day. If you drink alcohol: Limit how much you use to: 0-1 drink a day for women who are not pregnant. 0-2 drinks a day for men. Be aware of how much alcohol is in your  drink. In the [place] one drink equals one 12 oz bottle of beer (355 mL), one 5 oz glass of wine (148 mL), or one 1 oz glass of hard liquor (44 mL). Lose weight if told by your health care provider. Work with your dietitian to find an eating plan and weight loss strategies that work best for you. General information Talk to your health care provider and dietitian about taking daily supplements. You may be told the following depending on your health and the cause of your kidney stones: Not to take supplements with vitamin C. To take a calcium supplement. To take a daily probiotic supplement. To take other supplements such as magnesium, fish oil, or vitamin B6. Take over-the-counter and prescription medicines only as told by your health care provider. These include supplements. What foods should I limit? Limit your intake of the following foods, or eat them as told by your dietitian. Vegetables Spinach. Rhubarb. Beets. Canned vegetables. Pickles. Olives. Baked potatoes with skin. Grains Wheat bran. Baked goods. Salted crackers. Cereals high in sugar. Meats and other proteins Nuts. Nut butters. Large portions of meat, poultry, or fish. Salted, precooked, or cured meats, such as sausages, meat loaves, and hot dogs. Dairy Cheese. Beverages Regular soft drinks. Regular vegetable juice. Seasonings and condiments Seasoning blends with salt. Salad dressings. Soy sauce. Ketchup. Barbecue sauce. Other foods Canned soups. Canned pasta sauce. Casseroles. Pizza. Lasagna. Frozen meals. Potato chips. French fries. The items listed above may not be a complete list of foods and beverages you should limit. Contact a dietitian for more information. What foods should I avoid? Talk to your dietitian about specific foods you should avoid based on the type of kidney stones you have and your overall health. Fruits Grapefruit. The item listed above may not be a complete list of foods and beverages you should  avoid. Contact a dietitian for more information. Summary Kidney stones are deposits of minerals and salts that form inside your kidneys. You can lower your risk of kidney stones by making changes to your diet. The most important thing you can do is drink enough fluid. Drink enough fluid to keep your urine pale yellow. Talk to your dietitian about how much calcium you should have each day, and eat less salt and animal protein as told by your dietitian. This information is not intended to replace advice given to you by your health care provider. Make sure you discuss any questions you have with your health care provider. Document Revised: 05/10/2019 Document Reviewed: 05/10/2019 Elsevier Patient Education  2022 [employer] Inc[employer]  

## 2021-08-21 NOTE — Progress Notes (Signed)
? ?08/21/2021 ?12:45 PM  ? ?Stefanie Farmer ?1995-09-01 ?[id number] ? ?Referring provider: No referring provider defined for this encounter. ? ? ?Followup nephrolithiasis ? ?HPI: ?Stefanie Farmer is a 25yo here for followup for nephrolithiasis. She underwent left ureteroscopic stone extraction last week and removed her tethered stent POD#3. She denies any flank pain. She denies any LUTS. This was her first stone event.  ? ? ?PMH: ?No past medical history on file. ? ?Surgical History: ?Past Surgical History:  ?Procedure Laterality Date  ? CYSTOSCOPY W/ URETERAL STENT PLACEMENT Right 07/23/2021  ? Procedure: CYSTOSCOPY WITH RETROGRADE PYELOGRAM/URETERAL STENT PLACEMENT;  Surgeon: Stefanie Gauze, MD;  Location: [facility];  Service: Urology;  Laterality: Right;  ? CYSTOSCOPY W/ URETERAL STENT PLACEMENT Right 08/13/2021  ? Procedure: CYSTOSCOPY WITH RETROGRADE PYELOGRAM/URETERAL STENT EXCHANGE;  Surgeon: Stefanie Gauze, MD;  Location: [facility];  Service: Urology;  Laterality: Right;  ? EYE MUSCLE SURGERY    ? FOOT SURGERY    ? HOLMIUM LASER APPLICATION Right 08/13/2021  ? Procedure: HOLMIUM LASER APPLICATION;  Surgeon: Stefanie Gauze, MD;  Location: [facility];  Service: Urology;  Laterality: Right;  ? ORIF ANKLE FRACTURE Right 06/24/2020  ? Procedure: OPEN REDUCTION INTERNAL FIXATION (ORIF) ANKLE FRACTURE;  Surgeon: Stefanie Hearing, MD;  Location: [facility];  Service: Orthopedics;  Laterality: Right;  ? ? ?Home Medications:  ?Allergies as of 08/21/2021   ?No Known Allergies ?  ? ?  ?Medication List  ?  ? ?  ? Accurate as of August 21, 2021 12:45 PM. If you have any questions, ask your nurse or doctor.  ?  ?  ? ?  ? ?acetaminophen 650 MG CR tablet ?Commonly known as: TYLENOL ?Take 1,300 mg by mouth every 8 (eight) hours as needed for pain. ?  ?acetaminophen 325 MG tablet ?Commonly known as: TYLENOL ?Take 2 tablets (650 mg total) by mouth every 6 (six) hours as needed for mild pain or fever. ?  ?hyoscyamine 0.125 MG SL  tablet ?Commonly known as: LEVSIN SL ?Take 1 tablet (0.125 mg total) by mouth every 4 (four) hours as needed for cramping. ?  ?ondansetron 4 MG tablet ?Commonly known as: Zofran ?Take 1 tablet (4 mg total) by mouth daily as needed for nausea or vomiting. ?  ?OVER THE COUNTER MEDICATION ?Apply 1 application. topically 2 (two) times daily as needed (fever blisters). Carmex cold sore treatment ?  ?oxyCODONE-acetaminophen 7.5-325 MG tablet ?Commonly known as: Percocet ?Take 1 tablet by mouth every 8 (eight) hours as needed for severe pain. ?  ?polyethylene glycol 17 g packet ?Commonly known as: MIRALAX / GLYCOLAX ?Take 17 g by mouth 2 (two) times daily. ?  ?tamsulosin 0.4 MG Caps capsule ?Commonly known as: FLOMAX ?Take 1 capsule (0.4 mg total) by mouth daily after supper. ?  ? ?  ? ? ?Allergies: No Known Allergies ? ?Family History: ?Family History  ?Family history unknown: Yes  ? ? ?Social History:  reports that she has quit smoking. Her smoking use included cigarettes. She has never used smokeless tobacco. She reports current alcohol use. She reports current drug use. Drug: Marijuana. ? ?ROS: ?All other review of systems were reviewed and are negative except what is noted above in HPI ? ?Physical Exam: ?BP 126/85   Pulse (!) 111   LMP 08/04/2021   ?Constitutional:  Alert and oriented, No acute distress. ?HEENT: Stefanie Farmer AT, moist mucus membranes.  Trachea midline, no masses. ?Cardiovascular: No clubbing, cyanosis, or edema. ?Respiratory: Normal respiratory effort, no  increased work of breathing. ?GI: Abdomen is soft, nontender, nondistended, no abdominal masses ?GU: No CVA tenderness.  ?Lymph: No cervical or inguinal lymphadenopathy. ?Skin: No rashes, bruises or suspicious lesions. ?Neurologic: Grossly intact, no focal deficits, moving all 4 extremities. ?Psychiatric: Normal mood and affect. ? ?Laboratory Data: ?Lab Results  ?Component Value Date  ? WBC 10.9 (H) 07/24/2021  ? HGB 9.6 (L) 07/24/2021  ? HCT 30.0 (L)  07/24/2021  ? MCV 92.9 07/24/2021  ? PLT 201 07/24/2021  ? ? ?Lab Results  ?Component Value Date  ? CREATININE 0.87 07/23/2021  ? ? ?No results found for: PSA ? ?No results found for: TESTOSTERONE ? ?No results found for: HGBA1C ? ?Urinalysis ?   ?Component Value Date/Time  ? COLORURINE YELLOW 08/11/2021 0946  ? APPEARANCEUR CLOUDY (A) 08/11/2021 0946  ? LABSPEC 1.019 08/11/2021 0946  ? PHURINE 5.0 08/11/2021 0946  ? GLUCOSEU NEGATIVE 08/11/2021 0946  ? HGBUR LARGE (A) 08/11/2021 0946  ? BILIRUBINUR NEGATIVE 08/11/2021 0946  ? BILIRUBINUR negative 06/15/2021 1331  ? KETONESUR NEGATIVE 08/11/2021 0946  ? PROTEINUR 100 (A) 08/11/2021 0946  ? UROBILINOGEN 0.2 06/15/2021 1331  ? NITRITE NEGATIVE 08/11/2021 0946  ? LEUKOCYTESUR SMALL (A) 08/11/2021 0946  ? ? ?Lab Results  ?Component Value Date  ? BACTERIA RARE (A) 08/11/2021  ? ? ?Pertinent Imaging: ? ?No results found for this or any previous visit. ? ?No results found for this or any previous visit. ? ?No results found for this or any previous visit. ? ?No results found for this or any previous visit. ? ?No results found for this or any previous visit. ? ?No results found for this or any previous visit. ? ?No results found for this or any previous visit. ? ?No results found for this or any previous visit. ? ? ?Assessment & Plan:   ? ?1. Nephrolithiasis ?-RTC 6 weeks with renal US ?- Basic metabolic panel ?- Uric acid ?- PTH, Intact and Calcium ?- Ultrasound renal complete; Future ? ? ?No follow-ups on file. ? ?Stefanie Aye, MD ? ?[facility] [place] ?  ?

## 2021-08-22 LAB — BASIC METABOLIC PANEL
BUN/Creatinine Ratio: 10 (ref 9–23)
BUN: 7 mg/dL (ref 6–20)
CO2: 20 mmol/L (ref 20–29)
Calcium: 9.7 mg/dL (ref 8.7–10.2)
Chloride: 103 mmol/L (ref 96–106)
Creatinine, Ser: 0.69 mg/dL (ref 0.57–1.00)
Glucose: 88 mg/dL (ref 70–99)
Potassium: 5.4 mmol/L — ABNORMAL HIGH (ref 3.5–5.2)
Sodium: 140 mmol/L (ref 134–144)
eGFR: 125 mL/min/{1.73_m2} (ref >59)

## 2021-08-22 LAB — URIC ACID: Uric Acid: 5.5 mg/dL (ref 2.6–6.2)

## 2021-08-22 LAB — PTH, INTACT AND CALCIUM
Calcium: 9.7 mg/dL (ref 8.7–10.2)
PTH: 47 pg/mL (ref 15–65)

## 2021-09-15 ENCOUNTER — Encounter: Payer: Self-pay | Admitting: Advanced Practice Midwife

## 2021-09-15 ENCOUNTER — Other Ambulatory Visit (HOSPITAL_COMMUNITY)
Admission: RE | Admit: 2021-09-15 | Discharge: 2021-09-15 | Disposition: A | Discharge status: Home / Self Care | Payer: 59 | Source: Ambulatory Visit | Attending: Advanced Practice Midwife | Admitting: Advanced Practice Midwife

## 2021-09-15 ENCOUNTER — Ambulatory Visit (INDEPENDENT_AMBULATORY_CARE_PROVIDER_SITE_OTHER): Payer: 59 | Admitting: Advanced Practice Midwife

## 2021-09-15 VITALS — BP 148/94 | HR 94 | Ht 64.0 in | Wt 300.0 lb

## 2021-09-15 DIAGNOSIS — Z01419 Encounter for gynecological examination (general) (routine) without abnormal findings: Secondary | ICD-10-CM | POA: Insufficient documentation

## 2021-09-15 DIAGNOSIS — R030 Elevated blood-pressure reading, without diagnosis of hypertension: Secondary | ICD-10-CM | POA: Diagnosis not present

## 2021-09-15 DIAGNOSIS — O10919 Unspecified pre-existing hypertension complicating pregnancy, unspecified trimester: Secondary | ICD-10-CM | POA: Insufficient documentation

## 2021-09-15 NOTE — Progress Notes (Signed)
? ?WELL-WOMAN EXAMINATION ?Patient name: Stefanie Farmer MRN [id number]  Date of birth: February 03, 1996 ?Chief Complaint:   ?Gynecologic Exam ? ?History of Present Illness:   ?Stefanie Farmer is a 26 y.o. G59P0010 Caucasian female being seen today for a routine well-woman exam.  ?Current complaints: here for 1st pap smear; has been having unprotected sex for almost a year without conception- is wanting pregnancy but hasn't used ovulation predictor, etc; has pretty regular cycles ? ?PCP: in the process of finding      ?does not desire labs ?Patient's last menstrual period was 09/02/2021 (exact date). ?The current method of family planning is none.  ?Last pap never. Results were: N/A. H/O abnormal pap: no ?Last mammogram: never. Results were: N/A. Family h/o breast cancer: no ?Last colonoscopy: never. Results were: N/A. Family h/o colorectal cancer: no ? ? ?  09/15/2021  ? 11:14 AM  ?Depression screen PHQ 2/9  ?Decreased Interest 0  ?Down, Depressed, Hopeless 0  ?PHQ - 2 Score 0  ?Altered sleeping 1  ?Tired, decreased energy 0  ?Change in appetite 1  ?Feeling bad or failure about yourself  0  ?Trouble concentrating 0  ?Moving slowly or fidgety/restless 0  ?Suicidal thoughts 0  ?PHQ-9 Score 2  ? ?  ? ?  09/15/2021  ? 11:14 AM  ?GAD 7 : Generalized Anxiety Score  ?Nervous, Anxious, on Edge 1  ?Control/stop worrying 0  ?Worry too much - different things 0  ?Trouble relaxing 0  ?Restless 0  ?Easily annoyed or irritable 0  ?Afraid - awful might happen 0  ?Total GAD 7 Score 1  ? ? ? ?Review of Systems:   ?Pertinent items are noted in HPI ?Denies any headaches, blurred vision, fatigue, shortness of breath, chest pain, abdominal pain, abnormal vaginal discharge/itching/odor/irritation, problems with periods, bowel movements, urination, or intercourse unless otherwise stated above. ?Pertinent History Reviewed:  ?Reviewed past medical,surgical, social and family history.  ?Reviewed problem list, medications and allergies. ?Physical  Assessment:  ? ?Vitals:  ? 09/15/21 1112  ?BP: (!) 148/94  ?Pulse: 94  ?Weight: 300 lb (136.1 kg)  ?Height: 5\' 4"  (1.626 m)  ?Body mass index is 51.49 kg/m?. ?  ?     Physical Examination:  ? General appearance - well appearing, and in no distress ? Mental status - alert, oriented to person, place, and time ? Psych:  She has a normal mood and affect ? Skin - warm and dry, normal color, no suspicious lesions noted ? Chest - effort normal, all lung fields clear to auscultation bilaterally ? Heart - normal rate and regular rhythm ? Neck:  midline trachea, no thyromegaly or nodules ? Breasts - breasts appear normal, no suspicious masses, no skin or nipple changes or  axillary nodes ? Abdomen - soft, nontender, nondistended, no masses or organomegaly ? Pelvic - VULVA: normal appearing vulva with no masses, tenderness or lesions  VAGINA: normal appearing vagina with normal color and discharge, no lesions  CERVIX: normal appearing cervix without discharge or lesions, no CMT ? Thin prep pap is done without HR HPV cotesting ? UTERUS: uterus is felt to be normal size, shape, consistency and nontender  ? ADNEXA: No adnexal masses or tenderness noted. ? Rectal - not examined ? Extremities:  No swelling or varicosities noted ? ?Chaperone: Neas   ? ?No results found for this or any previous visit (from the past 24 hour(s)).  ?Assessment & Plan:  ?1) Well-Woman Exam ? ?2) Elevated BP without dx, most likely has  cHTN; recommended needs PCP for f/u ? ?3) Morbid obesity, rec weight loss and exercise, esp since wanting pregnancy ? ?Labs/procedures today: Pap ? ?Mammogram: @ 24yo, or sooner if problems ?Colonoscopy: @ 24yo, or sooner if problems ? ?No orders of the defined types were placed in this encounter. ? ? ?Meds: No orders of the defined types were placed in this encounter. ? ? ?Follow-up: Return for prn. ?Arabella Merles CNM ?09/15/2021 ?11:39 AM  ?

## 2021-09-16 ENCOUNTER — Other Ambulatory Visit: Payer: Self-pay | Admitting: Urology

## 2021-09-16 LAB — CYTOLOGY - PAP
Chlamydia: NEGATIVE
Comment: NEGATIVE
Comment: NORMAL
Diagnosis: NEGATIVE
Neisseria Gonorrhea: NEGATIVE

## 2021-09-22 ENCOUNTER — Ambulatory Visit (HOSPITAL_COMMUNITY)
Admission: RE | Admit: 2021-09-22 | Discharge: 2021-09-22 | Disposition: A | Discharge status: Home / Self Care | Payer: 59 | Source: Ambulatory Visit | Attending: Urology | Admitting: Urology

## 2021-09-22 DIAGNOSIS — N201 Calculus of ureter: Secondary | ICD-10-CM | POA: Insufficient documentation

## 2021-10-02 ENCOUNTER — Ambulatory Visit (INDEPENDENT_AMBULATORY_CARE_PROVIDER_SITE_OTHER): Payer: 59 | Admitting: Urology

## 2021-10-02 ENCOUNTER — Encounter: Payer: Self-pay | Admitting: Urology

## 2021-10-02 VITALS — BP 119/78 | HR 91

## 2021-10-02 DIAGNOSIS — N201 Calculus of ureter: Secondary | ICD-10-CM

## 2021-10-02 DIAGNOSIS — Z87442 Personal history of urinary calculi: Secondary | ICD-10-CM

## 2021-10-02 LAB — MICROSCOPIC EXAMINATION
Renal Epithel, UA: NONE SEEN /hpf
WBC, UA: NONE SEEN /hpf (ref 0–5)

## 2021-10-02 LAB — URINALYSIS, ROUTINE W REFLEX MICROSCOPIC
Bilirubin, UA: NEGATIVE
Glucose, UA: NEGATIVE
Ketones, UA: NEGATIVE
Leukocytes,UA: NEGATIVE
Nitrite, UA: NEGATIVE
Protein,UA: NEGATIVE
Specific Gravity, UA: >1.03 — ABNORMAL HIGH (ref 1.005–1.030)
Urobilinogen, Ur: 0.2 mg/dL (ref 0.2–1.0)
pH, UA: 5.5 (ref 5.0–7.5)

## 2021-10-02 NOTE — Progress Notes (Signed)
? ?10/02/2021 ?8:52 AM  ? ?Stefanie Farmer ?12/26/1995 ?[id number] ? ?Referring provider: No referring provider defined for this encounter. ? ?Followup nephrolithiasis ? ? ?HPI: ?[name] is a 26yo here for followup for nephrolithiasis. NO stone events since last visit. 24 hour urine shows very low volume, high sodium and high chloride. She drinks 8-10 oz of water daily. No flank pain. Renal US 4/25 shows no calculi and no hydronephrosis ? ? ?PMH: ?Past Medical History:  ?Diagnosis Date  ? Kidney stone   ? ? ?Surgical History: ?Past Surgical History:  ?Procedure Laterality Date  ? CYSTOSCOPY W/ URETERAL STENT PLACEMENT Right 07/23/2021  ? Procedure: CYSTOSCOPY WITH RETROGRADE PYELOGRAM/URETERAL STENT PLACEMENT;  Surgeon: Malen Gauze, MD;  Location: [facility];  Service: Urology;  Laterality: Right;  ? CYSTOSCOPY W/ URETERAL STENT PLACEMENT Right 08/13/2021  ? Procedure: CYSTOSCOPY WITH RETROGRADE PYELOGRAM/URETERAL STENT EXCHANGE;  Surgeon: Malen Gauze, MD;  Location: [facility];  Service: Urology;  Laterality: Right;  ? EYE MUSCLE SURGERY    ? FOOT SURGERY    ? HOLMIUM LASER APPLICATION Right 08/13/2021  ? Procedure: HOLMIUM LASER APPLICATION;  Surgeon: Malen Gauze, MD;  Location: [facility];  Service: Urology;  Laterality: Right;  ? ORIF ANKLE FRACTURE Right 06/24/2020  ? Procedure: OPEN REDUCTION INTERNAL FIXATION (ORIF) ANKLE FRACTURE;  Surgeon: Vickki Hearing, MD;  Location: [facility];  Service: Orthopedics;  Laterality: Right;  ? ? ?Home Medications:  ?Allergies as of 10/02/2021   ?No Known Allergies ?  ? ?  ?Medication List  ?  ? ?  ? Accurate as of Oct 02, 2021  8:52 AM. If you have any questions, ask your nurse or doctor.  ?  ?  ? ?  ? ?acetaminophen 325 MG tablet ?Commonly known as: TYLENOL ?Take 650 mg by mouth every 6 (six) hours as needed. ?  ?amoxicillin 500 MG capsule ?Commonly known as: AMOXIL ?Take 500 mg by mouth 3 (three) times daily. ?  ?ibuprofen 800 MG tablet ?Commonly known as:  ADVIL ?Take 800 mg by mouth every 8 (eight) hours as needed. ?  ? ?  ? ? ?Allergies: No Known Allergies ? ?Family History: ?Family History  ?Problem Relation Age of Onset  ? Diabetes Paternal Grandmother   ? Diabetes Maternal Grandmother   ? Crohn's disease Maternal Grandmother   ? Diabetes Maternal Grandfather   ? COPD Maternal Grandfather   ? Hypertension Father   ? Osteoporosis Mother   ? Fibromyalgia Mother   ? ? ?Social History:  reports that she has quit smoking. Her smoking use included cigarettes. She has never used smokeless tobacco. She reports current drug use. Frequency: 7.00 times per week. Drug: Marijuana. She reports that she does not drink alcohol. ? ?ROS: ?All other review of systems were reviewed and are negative except what is noted above in HPI ? ?Physical Exam: ?BP 119/78   Pulse 91   LMP 09/02/2021 (Exact Date)   ?Constitutional:  Alert and oriented, No acute distress. ?HEENT: [place] AT, moist mucus membranes.  Trachea midline, no masses. ?Cardiovascular: No clubbing, cyanosis, or edema. ?Respiratory: Normal respiratory effort, no increased work of breathing. ?GI: Abdomen is soft, nontender, nondistended, no abdominal masses ?GU: No CVA tenderness.  ?Lymph: No cervical or inguinal lymphadenopathy. ?Skin: No rashes, bruises or suspicious lesions. ?Neurologic: Grossly intact, no focal deficits, moving all 4 extremities. ?Psychiatric: Normal mood and affect. ? ?Laboratory Data: ?Lab Results  ?Component Value Date  ? WBC 10.9 (H) 07/24/2021  ? HGB  9.6 (L) 07/24/2021  ? HCT 30.0 (L) 07/24/2021  ? MCV 92.9 07/24/2021  ? PLT 201 07/24/2021  ? ? ?Lab Results  ?Component Value Date  ? CREATININE 0.69 08/21/2021  ? ? ?No results found for: PSA ? ?No results found for: TESTOSTERONE ? ?No results found for: HGBA1C ? ?Urinalysis ?   ?Component Value Date/Time  ? COLORURINE YELLOW 08/11/2021 0946  ? APPEARANCEUR Clear 08/21/2021 1333  ? LABSPEC 1.019 08/11/2021 0946  ? PHURINE 5.0 08/11/2021 0946  ? GLUCOSEU  Negative 08/21/2021 1333  ? HGBUR LARGE (A) 08/11/2021 0946  ? BILIRUBINUR Negative 08/21/2021 1333  ? KETONESUR NEGATIVE 08/11/2021 0946  ? PROTEINUR Negative 08/21/2021 1333  ? PROTEINUR 100 (A) 08/11/2021 0946  ? UROBILINOGEN 0.2 06/15/2021 1331  ? NITRITE Negative 08/21/2021 1333  ? NITRITE NEGATIVE 08/11/2021 0946  ? LEUKOCYTESUR Trace (A) 08/21/2021 1333  ? LEUKOCYTESUR SMALL (A) 08/11/2021 0946  ? ? ?Lab Results  ?Component Value Date  ? LABMICR See below: 08/21/2021  ? WBCUA 6-10 (A) 08/21/2021  ? LABEPIT >10 (A) 08/21/2021  ? MUCUS Present 08/21/2021  ? BACTERIA Many (A) 08/21/2021  ? ? ?Pertinent Imaging: ?Renal US 09/22/2021: Images reviewed and discussed with the patient  ?No results found for this or any previous visit. ? ?No results found for this or any previous visit. ? ?No results found for this or any previous visit. ? ?No results found for this or any previous visit. ? ?Results for orders placed during the hospital encounter of 09/22/21 ? ?Ultrasound renal complete ? ?Narrative ?CLINICAL DATA:  Ureteral stone follow-up ? ?EXAM: ?RENAL / URINARY TRACT ULTRASOUND COMPLETE ? ?COMPARISON:  CT abdomen and pelvis 07/22/2021 ? ?FINDINGS: ?Right Kidney: ? ?Renal measurements: 12 x 5.4 x 6.1 cm = volume: 206 mL. Echogenicity ?within normal limits. No mass or hydronephrosis visualized. ? ?Left Kidney: ? ?Renal measurements: 12.3 x 6.2 x 5.9 cm = volume: 237 mL. ?Echogenicity within normal limits. No mass or hydronephrosis ?visualized. ? ?Bladder: ? ?Appears normal for degree of bladder distention. ? ?Other: ? ?None. ? ?IMPRESSION: ?No nephrolithiasis or hydronephrosis visualized. ? ? ?Electronically Signed ?By: Jannifer Hick M.D. ?On: 09/22/2021 16:54 ? ?No results found for this or any previous visit. ? ?No results found for this or any previous visit. ? ?No results found for this or any previous visit. ? ? ?Assessment & Plan:   ? ?1. Nephrolithiasis ?-Increase water intake, decrease sodium intake ?-RTC  6 months  ?- Urinalysis, Routine w reflex microscopic ? ? ?No follow-ups on file. ? ?Wilkie Aye, MD ? ?[facility] [place] ?  ?

## 2021-10-02 NOTE — Patient Instructions (Signed)
Dietary Guidelines to Help Prevent Kidney Stones Kidney stones are deposits of minerals and salts that form inside your kidneys. Your risk of developing kidney stones may be greater depending on your diet, your lifestyle, the medicines you take, and whether you have certain medical conditions. Most people can lower their chances of developing kidney stones by following the instructions below. Your dietitian may give you more specific instructions depending on your overall health and the type of kidney stones you tend to develop. What are tips for following this plan? Reading food labels  Choose foods with "no salt added" or "low-salt" labels. Limit your salt (sodium) intake to less than 1,500 mg a day. Choose foods with calcium for each meal and snack. Try to eat about 300 mg of calcium at each meal. Foods that contain 200-500 mg of calcium a serving include: 8 oz (237 mL) of milk, calcium-fortifiednon-dairy milk, and calcium-fortifiedfruit juice. Calcium-fortified means that calcium has been added to these drinks. 8 oz (237 mL) of kefir, yogurt, and soy yogurt. 4 oz (114 g) of tofu. 1 oz (28 g) of cheese. 1 cup (150 g) of dried figs. 1 cup (91 g) of cooked broccoli. One 3 oz (85 g) can of sardines or mackerel. Most people need 1,000-1,500 mg of calcium a day. Talk to your dietitian about how much calcium is recommended for you. Shopping Buy plenty of fresh fruits and vegetables. Most people do not need to avoid fruits and vegetables, even if these foods contain nutrients that may contribute to kidney stones. When shopping for convenience foods, choose: Whole pieces of fruit. Pre-made salads with dressing on the side. Low-fat fruit and yogurt smoothies. Avoid buying frozen meals or prepared deli foods. These can be high in sodium. Look for foods with live cultures, such as yogurt and kefir. Choose high-fiber grains, such as whole-wheat breads, oat bran, and wheat cereals. Cooking Do not add  salt to food when cooking. Place a salt shaker on the table and allow each person to add his or her own salt to taste. Use vegetable protein, such as beans, textured vegetable protein (TVP), or tofu, instead of meat in pasta, casseroles, and soups. Meal planning Eat less salt, if told by your dietitian. To do this: Avoid eating processed or pre-made food. Avoid eating fast food. Eat less animal protein, including cheese, meat, poultry, or fish, if told by your dietitian. To do this: Limit the number of times you have meat, poultry, fish, or cheese each week. Eat a diet free of meat at least 2 days a week. Eat only one serving each day of meat, poultry, fish, or seafood. When you prepare animal protein, cut pieces into small portion sizes. For most meat and fish, one serving is about the size of the palm of your hand. Eat at least five servings of fresh fruits and vegetables each day. To do this: Keep fruits and vegetables on hand for snacks. Eat one piece of fruit or a handful of berries with breakfast. Have a salad and fruit at lunch. Have two kinds of vegetables at dinner. Limit foods that are high in a substance called oxalate. These include: Spinach (cooked), rhubarb, beets, sweet potatoes, and Swiss chard. Peanuts. Potato chips, french fries, and baked potatoes with skin on. Nuts and nut products. Chocolate. If you regularly take a diuretic medicine, make sure to eat at least 1 or 2 servings of fruits or vegetables that are high in potassium each day. These include: Avocado. Banana. Orange, prune,   carrot, or tomato juice. Baked potato. Cabbage. Beans and split peas. Lifestyle  Drink enough fluid to keep your urine pale yellow. This is the most important thing you can do. Spread your fluid intake throughout the day. If you drink alcohol: Limit how much you use to: 0-1 drink a day for women who are not pregnant. 0-2 drinks a day for men. Be aware of how much alcohol is in your  drink. In the [place] one drink equals one 12 oz bottle of beer (355 mL), one 5 oz glass of wine (148 mL), or one 1 oz glass of hard liquor (44 mL). Lose weight if told by your health care provider. Work with your dietitian to find an eating plan and weight loss strategies that work best for you. General information Talk to your health care provider and dietitian about taking daily supplements. You may be told the following depending on your health and the cause of your kidney stones: Not to take supplements with vitamin C. To take a calcium supplement. To take a daily probiotic supplement. To take other supplements such as magnesium, fish oil, or vitamin B6. Take over-the-counter and prescription medicines only as told by your health care provider. These include supplements. What foods should I limit? Limit your intake of the following foods, or eat them as told by your dietitian. Vegetables Spinach. Rhubarb. Beets. Canned vegetables. Pickles. Olives. Baked potatoes with skin. Grains Wheat bran. Baked goods. Salted crackers. Cereals high in sugar. Meats and other proteins Nuts. Nut butters. Large portions of meat, poultry, or fish. Salted, precooked, or cured meats, such as sausages, meat loaves, and hot dogs. Dairy Cheese. Beverages Regular soft drinks. Regular vegetable juice. Seasonings and condiments Seasoning blends with salt. Salad dressings. Soy sauce. Ketchup. Barbecue sauce. Other foods Canned soups. Canned pasta sauce. Casseroles. Pizza. Lasagna. Frozen meals. Potato chips. French fries. The items listed above may not be a complete list of foods and beverages you should limit. Contact a dietitian for more information. What foods should I avoid? Talk to your dietitian about specific foods you should avoid based on the type of kidney stones you have and your overall health. Fruits Grapefruit. The item listed above may not be a complete list of foods and beverages you should  avoid. Contact a dietitian for more information. Summary Kidney stones are deposits of minerals and salts that form inside your kidneys. You can lower your risk of kidney stones by making changes to your diet. The most important thing you can do is drink enough fluid. Drink enough fluid to keep your urine pale yellow. Talk to your dietitian about how much calcium you should have each day, and eat less salt and animal protein as told by your dietitian. This information is not intended to replace advice given to you by your health care provider. Make sure you discuss any questions you have with your health care provider. Document Revised: 01/26/2021 Document Reviewed: 01/26/2021 Elsevier Patient Education  2023 [employer]  

## 2021-12-31 ENCOUNTER — Other Ambulatory Visit: Payer: Self-pay | Admitting: Obstetrics & Gynecology

## 2021-12-31 ENCOUNTER — Encounter: Payer: Self-pay | Admitting: *Deleted

## 2021-12-31 DIAGNOSIS — Z3682 Encounter for antenatal screening for nuchal translucency: Secondary | ICD-10-CM

## 2022-01-01 ENCOUNTER — Encounter: Payer: Self-pay | Admitting: Advanced Practice Midwife

## 2022-01-01 ENCOUNTER — Ambulatory Visit (INDEPENDENT_AMBULATORY_CARE_PROVIDER_SITE_OTHER): Payer: Medicaid Other | Admitting: Advanced Practice Midwife

## 2022-01-01 ENCOUNTER — Ambulatory Visit (INDEPENDENT_AMBULATORY_CARE_PROVIDER_SITE_OTHER): Payer: Medicaid Other

## 2022-01-01 ENCOUNTER — Ambulatory Visit: Payer: Medicaid Other | Admitting: *Deleted

## 2022-01-01 VITALS — BP 129/74 | HR 56 | Wt 293.8 lb

## 2022-01-01 DIAGNOSIS — Z3A12 12 weeks gestation of pregnancy: Secondary | ICD-10-CM

## 2022-01-01 DIAGNOSIS — Z1379 Encounter for other screening for genetic and chromosomal anomalies: Secondary | ICD-10-CM

## 2022-01-01 DIAGNOSIS — Z3480 Encounter for supervision of other normal pregnancy, unspecified trimester: Secondary | ICD-10-CM

## 2022-01-01 DIAGNOSIS — Z3682 Encounter for antenatal screening for nuchal translucency: Secondary | ICD-10-CM | POA: Diagnosis not present

## 2022-01-01 DIAGNOSIS — O0990 Supervision of high risk pregnancy, unspecified, unspecified trimester: Secondary | ICD-10-CM | POA: Insufficient documentation

## 2022-01-01 DIAGNOSIS — Z3481 Encounter for supervision of other normal pregnancy, first trimester: Secondary | ICD-10-CM | POA: Diagnosis not present

## 2022-01-01 DIAGNOSIS — Z113 Encounter for screening for infections with a predominantly sexual mode of transmission: Secondary | ICD-10-CM | POA: Diagnosis not present

## 2022-01-01 DIAGNOSIS — Z363 Encounter for antenatal screening for malformations: Secondary | ICD-10-CM

## 2022-01-01 DIAGNOSIS — Z31430 Encounter of female for testing for genetic disease carrier status for procreative management: Secondary | ICD-10-CM

## 2022-01-01 LAB — POCT URINALYSIS DIPSTICK OB
Blood, UA: NEGATIVE
Glucose, UA: NEGATIVE
Ketones, UA: NEGATIVE
Leukocytes, UA: NEGATIVE
Nitrite, UA: NEGATIVE
POC,PROTEIN,UA: NEGATIVE

## 2022-01-01 MED ORDER — BLOOD PRESSURE CUFF MISC: 1.0000 | 0 refills | Status: DC

## 2022-01-01 MED ORDER — ASPIRIN 81 MG PO CHEW: 162.0000 mg | CHEWABLE_TABLET | Freq: Every day | ORAL | 7 refills | Status: DC

## 2022-01-01 NOTE — Progress Notes (Signed)
[name] 12+4 wks,measurements c/w dates,CRL 59.36 mm,normal ovaries,NB present,FHR 174 bpm,unable to obtain NT because of fetal position and pt body habitus

## 2022-01-01 NOTE — Patient Instructions (Signed)
[name], thank you for choosing our office today! We appreciate the opportunity to meet your healthcare needs. You may receive a short survey by mail, e-mail, or through MyChart. If you are happy with your care we would appreciate if you could take just a few minutes to complete the survey questions. We read all of your comments and take your feedback very seriously. Thank you again for choosing our office.  [facility] [place] ([address] [place], [place] [address]) Entrance C, located off of [address] Free 24/7 valet parking   Nausea & Vomiting Have saltine crackers or pretzels by your bed and eat a few bites before you raise your head out of bed in the morning Eat small frequent meals throughout the day instead of large meals Drink plenty of fluids throughout the day to stay hydrated, just don't drink a lot of fluids with your meals.  This can make your stomach fill up faster making you feel sick Do not brush your teeth right after you eat Products with real ginger are good for nausea, like ginger ale and ginger hard candy Make sure it says made with real ginger! Sucking on sour candy like lemon heads is also good for nausea If your prenatal vitamins make you nauseated, take them at night so you will sleep through the nausea Sea Bands If you feel like you need medicine for the nausea & vomiting please let us know If you are unable to keep any fluids or food down please let us know   Constipation Drink plenty of fluid, preferably water, throughout the day Eat foods high in fiber such as fruits, vegetables, and grains Exercise, such as walking, is a good way to keep your bowels regular Drink warm fluids, especially warm prune juice, or decaf coffee Eat a 1/2 cup of real oatmeal (not instant), 1/2 cup applesauce, and 1/2-1 cup warm prune juice every day If needed, you may take Colace (docusate sodium) stool softener  once or twice a day to help keep the stool soft.  If you still are having problems with constipation, you may take Miralax once daily as needed to help keep your bowels regular.   Home Blood Pressure Monitoring for Patients   Your provider has recommended that you check your blood pressure (BP) at least once a week at home. If you do not have a blood pressure cuff at home, one will be provided for you. Contact your provider if you have not received your monitor within 1 week.   Helpful Tips for Accurate Home Blood Pressure Checks  Don't smoke, exercise, or drink caffeine 30 minutes before checking your BP Use the restroom before checking your BP (a full bladder can raise your pressure) Relax in a comfortable upright chair Feet on the ground Left arm resting comfortably on a flat surface at the level of your heart Legs uncrossed Back supported Sit quietly and don't talk Place the cuff on your bare arm Adjust snuggly, so that only two fingertips can fit between your skin and the top of the cuff Check 2 readings separated by at least one minute Keep a log of your BP readings For a visual, please reference this diagram: http://ccnc.care/bpdiagram  Provider Name: [facility]     Phone: [phone]  Zone 1: ALL CLEAR  Continue to monitor your symptoms:  BP reading is less than 140 (top number) or less than 90 (bottom   number)  No right upper stomach pain No headaches or seeing spots No feeling nauseated or throwing up No swelling in face and hands  Zone 2: CAUTION Call your doctor's office for any of the following:  BP reading is greater than 140 (top number) or greater than 90 (bottom number)  Stomach pain under your ribs in the middle or right side Headaches or seeing spots Feeling nauseated or throwing up Swelling in face and hands  Zone 3: EMERGENCY  Seek immediate medical care if you have any of the following:  BP reading is greater than160 (top number) or greater than  110 (bottom number) Severe headaches not improving with Tylenol Serious difficulty catching your breath Any worsening symptoms from Zone 2    First Trimester of Pregnancy The first trimester of pregnancy is from week 1 until the end of week 12 (months 1 through 3). A week after a sperm fertilizes an egg, the egg will implant on the wall of the uterus. This embryo will begin to develop into a baby. Genes from you and your partner are forming the baby. The female genes determine whether the baby is a boy or a girl. At 6-8 weeks, the eyes and face are formed, and the heartbeat can be seen on ultrasound. At the end of 12 weeks, all the baby's organs are formed.  Now that you are pregnant, you will want to do everything you can to have a healthy baby. Two of the most important things are to get good prenatal care and to follow your health care provider's instructions. Prenatal care is all the medical care you receive before the baby's birth. This care will help prevent, find, and treat any problems during the pregnancy and childbirth. BODY CHANGES Your body goes through many changes during pregnancy. The changes vary from woman to woman.  You may gain or lose a couple of pounds at first. You may feel sick to your stomach (nauseous) and throw up (vomit). If the vomiting is uncontrollable, call your health care provider. You may tire easily. You may develop headaches that can be relieved by medicines approved by your health care provider. You may urinate more often. Painful urination may mean you have a bladder infection. You may develop heartburn as a result of your pregnancy. You may develop constipation because certain hormones are causing the muscles that push waste through your intestines to slow down. You may develop hemorrhoids or swollen, bulging veins (varicose veins). Your breasts may begin to grow larger and become tender. Your nipples may stick out more, and the tissue that surrounds them  (areola) may become darker. Your gums may bleed and may be sensitive to brushing and flossing. Dark spots or blotches (chloasma, mask of pregnancy) may develop on your face. This will likely fade after the baby is born. Your menstrual periods will stop. You may have a loss of appetite. You may develop cravings for certain kinds of food. You may have changes in your emotions from day to day, such as being excited to be pregnant or being concerned that something may go wrong with the pregnancy and baby. You may have more vivid and strange dreams. You may have changes in your hair. These can include thickening of your hair, rapid growth, and changes in texture. Some women also have hair loss during or after pregnancy, or hair that feels dry or thin. Your hair will most likely return to normal after your baby is born. WHAT TO EXPECT AT YOUR PRENATAL   VISITS During a routine prenatal visit: You will be weighed to make sure you and the baby are growing normally. Your blood pressure will be taken. Your abdomen will be measured to track your baby's growth. The fetal heartbeat will be listened to starting around week 10 or 12 of your pregnancy. Test results from any previous visits will be discussed. Your health care provider may ask you: How you are feeling. If you are feeling the baby move. If you have had any abnormal symptoms, such as leaking fluid, bleeding, severe headaches, or abdominal cramping. If you have any questions. Other tests that may be performed during your first trimester include: Blood tests to find your blood type and to check for the presence of any previous infections. They will also be used to check for low iron levels (anemia) and Rh antibodies. Later in the pregnancy, blood tests for diabetes will be done along with other tests if problems develop. Urine tests to check for infections, diabetes, or protein in the urine. An ultrasound to confirm the proper growth and development  of the baby. An amniocentesis to check for possible genetic problems. Fetal screens for spina bifida and Down syndrome. You may need other tests to make sure you and the baby are doing well. HOME CARE INSTRUCTIONS  Medicines Follow your health care provider's instructions regarding medicine use. Specific medicines may be either safe or unsafe to take during pregnancy. Take your prenatal vitamins as directed. If you develop constipation, try taking a stool softener if your health care provider approves. Diet Eat regular, well-balanced meals. Choose a variety of foods, such as meat or vegetable-based protein, fish, milk and low-fat dairy products, vegetables, fruits, and whole grain breads and cereals. Your health care provider will help you determine the amount of weight gain that is right for you. Avoid raw meat and uncooked cheese. These carry germs that can cause birth defects in the baby. Eating four or five small meals rather than three large meals a day may help relieve nausea and vomiting. If you start to feel nauseous, eating a few soda crackers can be helpful. Drinking liquids between meals instead of during meals also seems to help nausea and vomiting. If you develop constipation, eat more high-fiber foods, such as fresh vegetables or fruit and whole grains. Drink enough fluids to keep your urine clear or pale yellow. Activity and Exercise Exercise only as directed by your health care provider. Exercising will help you: Control your weight. Stay in shape. Be prepared for labor and delivery. Experiencing pain or cramping in the lower abdomen or low back is a good sign that you should stop exercising. Check with your health care provider before continuing normal exercises. Try to avoid standing for long periods of time. Move your legs often if you must stand in one place for a long time. Avoid heavy lifting. Wear low-heeled shoes, and practice good posture. You may continue to have sex  unless your health care provider directs you otherwise. Relief of Pain or Discomfort Wear a good support bra for breast tenderness.   Take warm sitz baths to soothe any pain or discomfort caused by hemorrhoids. Use hemorrhoid cream if your health care provider approves.   Rest with your legs elevated if you have leg cramps or low back pain. If you develop varicose veins in your legs, wear support hose. Elevate your feet for 15 minutes, 3-4 times a day. Limit salt in your diet. Prenatal Care Schedule your prenatal visits by the   twelfth week of pregnancy. They are usually scheduled monthly at first, then more often in the last 2 months before delivery. Write down your questions. Take them to your prenatal visits. Keep all your prenatal visits as directed by your health care provider. Safety Wear your seat belt at all times when driving. Make a list of emergency phone numbers, including numbers for family, friends, the hospital, and police and fire departments. General Tips Ask your health care provider for a referral to a local prenatal education class. Begin classes no later than at the beginning of month 6 of your pregnancy. Ask for help if you have counseling or nutritional needs during pregnancy. Your health care provider can offer advice or refer you to specialists for help with various needs. Do not use hot tubs, steam rooms, or saunas. Do not douche or use tampons or scented sanitary pads. Do not cross your legs for long periods of time. Avoid cat litter boxes and soil used by cats. These carry germs that can cause birth defects in the baby and possibly loss of the fetus by miscarriage or stillbirth. Avoid all smoking, herbs, alcohol, and medicines not prescribed by your health care provider. Chemicals in these affect the formation and growth of the baby. Schedule a dentist appointment. At home, brush your teeth with a soft toothbrush and be gentle when you floss. SEEK MEDICAL CARE IF:   You have dizziness. You have mild pelvic cramps, pelvic pressure, or nagging pain in the abdominal area. You have persistent nausea, vomiting, or diarrhea. You have a bad smelling vaginal discharge. You have pain with urination. You notice increased swelling in your face, hands, legs, or ankles. SEEK IMMEDIATE MEDICAL CARE IF:  You have a fever. You are leaking fluid from your vagina. You have spotting or bleeding from your vagina. You have severe abdominal cramping or pain. You have rapid weight gain or loss. You vomit blood or material that looks like coffee grounds. You are exposed to German measles and have never had them. You are exposed to fifth disease or chickenpox. You develop a severe headache. You have shortness of breath. You have any kind of trauma, such as from a fall or a car accident. Document Released: 05/11/2001 Document Revised: 10/01/2013 Document Reviewed: 03/27/2013 [employer] Patient Information 2015 [employer]. This information is not intended to replace advice given to you by your health care provider. Make sure you discuss any questions you have with your health care provider.  

## 2022-01-01 NOTE — Progress Notes (Signed)
INITIAL OBSTETRICAL VISIT Patient name: Stefanie Farmer MRN [id number]  Date of birth: Jan 01, 1996 Chief Complaint:   Initial Prenatal Visit  History of Present Illness:   Stefanie Farmer is a 26 y.o. G41P0010 Caucasian female at 79w4dby LMP c/w u/s at 12.4 weeks with an Estimated Date of Delivery: 07/12/22 being seen today for her initial obstetrical visit.   Patient's last menstrual period was 10/05/2021. Her obstetrical history is significant for  SAB 2018 .   Today she reports  N/V improving .  Last pap April 2023. Results were: NILM w/ HRHPV not done     01/01/2022   10:49 AM 09/15/2021   11:14 AM  Depression screen PHQ 2/9  Decreased Interest 0 0  Down, Depressed, Hopeless 0 0  PHQ - 2 Score 0 0  Altered sleeping 2 1  Tired, decreased energy 2 0  Change in appetite 2 1  Feeling bad or failure about yourself  0 0  Trouble concentrating 0 0  Moving slowly or fidgety/restless 0 0  Suicidal thoughts 0 0  PHQ-9 Score 6 2        01/01/2022   10:49 AM 09/15/2021   11:14 AM  GAD 7 : Generalized Anxiety Score  Nervous, Anxious, on Edge 0 1  Control/stop worrying 0 0  Worry too much - different things 0 0  Trouble relaxing 0 0  Restless 0 0  Easily annoyed or irritable 0 0  Afraid - awful might happen 0 0  Total GAD 7 Score 0 1     Review of Systems:   Pertinent items are noted in HPI Denies cramping/contractions, leakage of fluid, vaginal bleeding, abnormal vaginal discharge w/ itching/odor/irritation, headaches, visual changes, shortness of breath, chest pain, abdominal pain, severe nausea/vomiting, or problems with urination or bowel movements unless otherwise stated above.  Pertinent History Reviewed:  Reviewed past medical,surgical, social, obstetrical and family history.  Reviewed problem list, medications and allergies. OB History  Gravida Para Term Preterm AB Living  2       1 0  SAB IAB Ectopic Multiple Live Births  1       0    # Outcome Date GA Lbr Len/2nd  Weight Sex Delivery Anes PTL Lv  2 Current           1 SAB 05/2016           Physical Assessment:   Vitals:   01/01/22 1102  BP: 129/74  Pulse: (!) 56  Weight: 293 lb 12.8 oz (133.3 kg)  Body mass index is 50.43 kg/m.       Physical Examination:  General appearance - well appearing, and in no distress  Mental status - alert, oriented to person, place, and time  Psych:  She has a normal mood and affect  Skin - warm and dry, normal color, no suspicious lesions noted  Chest - effort normal, all lung fields clear to auscultation bilaterally  Heart - normal rate and regular rhythm  Abdomen - soft, nontender  Extremities:  No swelling or varicosities noted  Pelvic - not indicated  Thin prep pap is not done    Unable to obtain NT: [place]12+4 wks,measurements c/w dates,CRL 59.36 mm,normal ovaries,NB present,FHR 174 bpm,unable to obtain NT because of fetal position and pt body habitus  Results for orders placed or performed in visit on 01/01/22 (from the past 24 hour(s))  POC Urinalysis Dipstick OB   Collection Time: 01/01/22 11:20 AM  Result Value Ref Range  Color, UA     Clarity, UA     Glucose, UA Negative Negative   Bilirubin, UA     Ketones, UA neg    Spec Grav, UA     Blood, UA neg    pH, UA     POC,PROTEIN,UA Negative Negative, Trace, Small (1+), Moderate (2+), Large (3+), 4+   Urobilinogen, UA     Nitrite, UA neg    Leukocytes, UA Negative Negative   Appearance     Odor      Assessment & Plan:  1) Low-Risk Pregnancy G2P0010 at 50w4dwith an Estimated Date of Delivery: 07/12/22   2) Initial OB visit  3) Nullip/BMI, rx bASA 169mday  4) Hx of ^BP x 1, nl today, will follow  5) Hx kidney stone  Meds:  Meds ordered this encounter  Medications   Blood Pressure Monitoring (BLOOD PRESSURE CUFF) MISC    Sig: 1 kit by Does not apply route as directed.    Dispense:  1 each    Refill:  0   aspirin 81 MG chewable tablet    Sig: Chew 2 tablets (162 mg total) by  mouth daily.    Dispense:  60 tablet    Refill:  7    Order Specific Question:   Supervising Provider    Answer:   EUTania Ade [[id number]]    Initial labs obtained Continue prenatal vitamins Reviewed n/v relief measures and warning s/s to report Reviewed recommended weight gain based on pre-gravid BMI Encouraged well-balanced diet Genetic & carrier screening discussed: requests Panorama, AFP, and Horizon , unable to obtain NT/IT Ultrasound discussed; fetal survey: requested CCNC completed> form faxed if has or is planning to apply for medicaid The nature of [facility] MDs and other Advanced Practice Providers was explained to patient; also emphasized that fellows, residents, and students are part of our team. Does not have home bp cuff. Office bp cuff given: no. Rx sent: yes. Check bp weekly, let usKoreanow if consistently >140/90.   Indications for ASA therapy (per uptodate) OR Two or more of the following: Nulliparity Yes Obesity (BMI>30 kg/m2) Yes  Indications for early A1C (per uptodate) BMI >=25 (>=23 in Asian women) AND one of the following Physical inactivity Yes   Follow-up: Return in about 4 weeks (around 01/29/2022) for 4wk LROB, in person; 7-8wk LROB and anatomy u/s.   Orders Placed This Encounter  Procedures   Urine Culture   GC/Chlamydia Probe Amp   US[place]oreaB Comp + 14 Wk   CBC/D/Plt+RPR+Rh+ABO+RubIgG...   HgB A1c   Panorama Prenatal Test Full Panel   HORIZON Custom   POC Urinalysis Dipstick OB    [facility]/08/2021 12:25 PM

## 2022-01-02 LAB — CBC/D/PLT+RPR+RH+ABO+RUBIGG...
Antibody Screen: NEGATIVE
Basophils Absolute: 0 10*3/uL (ref 0.0–0.2)
Basos: 1 %
EOS (ABSOLUTE): 0 10*3/uL (ref 0.0–0.4)
Eos: 0 %
HCV Ab: NONREACTIVE
HIV Screen 4th Generation wRfx: NONREACTIVE
Hematocrit: 35.1 % (ref 34.0–46.6)
Hemoglobin: 11.7 g/dL (ref 11.1–15.9)
Hepatitis B Surface Ag: NEGATIVE
Immature Grans (Abs): 0.1 10*3/uL (ref 0.0–0.1)
Immature Granulocytes: 1 %
Lymphocytes Absolute: 1.8 10*3/uL (ref 0.7–3.1)
Lymphs: 25 %
MCH: 29.3 pg (ref 26.6–33.0)
MCHC: 33.3 g/dL (ref 31.5–35.7)
MCV: 88 fL (ref 79–97)
Monocytes Absolute: 0.5 10*3/uL (ref 0.1–0.9)
Monocytes: 7 %
Neutrophils Absolute: 4.8 10*3/uL (ref 1.4–7.0)
Neutrophils: 66 %
Platelets: 318 10*3/uL (ref 150–450)
RBC: 3.99 x10E6/uL (ref 3.77–5.28)
RDW: 13.3 % (ref 11.7–15.4)
RPR Ser Ql: NONREACTIVE
Rh Factor: POSITIVE
Rubella Antibodies, IGG: 1.13 index (ref >0.99)
WBC: 7.3 10*3/uL (ref 3.4–10.8)

## 2022-01-02 LAB — HEMOGLOBIN A1C
Est. average glucose Bld gHb Est-mCnc: 100 mg/dL
Hgb A1c MFr Bld: 5.1 % (ref 4.8–5.6)

## 2022-01-02 LAB — HCV INTERPRETATION

## 2022-01-03 LAB — URINE CULTURE

## 2022-01-05 LAB — GC/CHLAMYDIA PROBE AMP
Chlamydia trachomatis, NAA: NEGATIVE
Neisseria Gonorrhoeae by PCR: NEGATIVE

## 2022-01-07 LAB — PANORAMA PRENATAL TEST FULL PANEL:PANORAMA TEST PLUS 5 ADDITIONAL MICRODELETIONS: FETAL FRACTION: 3.6

## 2022-01-08 ENCOUNTER — Encounter: Payer: Self-pay | Admitting: Women's Health

## 2022-01-13 LAB — HORIZON CUSTOM: REPORT SUMMARY: NEGATIVE

## 2022-02-03 ENCOUNTER — Other Ambulatory Visit: Payer: Medicaid Other

## 2022-02-03 ENCOUNTER — Ambulatory Visit (INDEPENDENT_AMBULATORY_CARE_PROVIDER_SITE_OTHER): Payer: Medicaid Other | Admitting: Advanced Practice Midwife

## 2022-02-03 VITALS — BP 135/85 | HR 94 | Wt 294.0 lb

## 2022-02-03 DIAGNOSIS — Z3480 Encounter for supervision of other normal pregnancy, unspecified trimester: Secondary | ICD-10-CM

## 2022-02-03 DIAGNOSIS — Z3A17 17 weeks gestation of pregnancy: Secondary | ICD-10-CM

## 2022-02-03 NOTE — Progress Notes (Signed)
   LOW-RISK PREGNANCY VISIT Patient name: Stefanie Farmer MRN [id number]  Date of birth: 1995-12-25 Chief Complaint:   Routine Prenatal Visit  History of Present Illness:   Stefanie Farmer is a 26 y.o. G67P0010 female at [redacted]w[redacted]d with an Estimated Date of Delivery: 07/12/22 being seen today for ongoing management of a low-risk pregnancy.  Today she reports no complaints. Contractions: Not present. Vag. Bleeding: None.   Marland Kitchen  leaking of fluid. Review of Systems:   Pertinent items are noted in HPI Denies abnormal vaginal discharge w/ itching/odor/irritation, headaches, visual changes, shortness of breath, chest pain, abdominal pain, severe nausea/vomiting, or problems with urination or bowel movements unless otherwise stated above. Pertinent History Reviewed:  Reviewed past medical,surgical, social, obstetrical and family history.  Reviewed problem list, medications and allergies. Physical Assessment:   Vitals:   02/03/22 1448  BP: 135/85  Pulse: 94  Weight: 294 lb (133.4 kg)  Body mass index is 50.46 kg/m.        Physical Examination:   General appearance: Well appearing, and in no distress  Mental status: Alert, oriented to person, place, and time  Skin: Warm & dry  Cardiovascular: Normal heart rate noted  Respiratory: Normal respiratory effort, no distress  Abdomen: Soft, gravid, nontender  Pelvic: Cervical exam deferred         Extremities: Edema: None  Fetal Status: Fetal Heart Rate (bpm): 155        Chaperone: n/a    No results found for this or any previous visit (from the past 24 hour(s)).  Assessment & Plan:  1) Low-risk pregnancy G2P0010 at [redacted]w[redacted]d with an Estimated Date of Delivery: 07/12/22      Meds: No orders of the defined types were placed in this encounter.  Labs/procedures today: AFP  Plan:  Continue routine obstetrical care  Next visit: prefers will be in person for anatomy scan     Reviewed: Preterm labor symptoms and general obstetric precautions including  but not limited to vaginal bleeding, contractions, leaking of fluid and fetal movement were reviewed in detail with the patient.  All questions were answered. Has home bp cuff.. Check bp weekly, let us know if >140/90.   Follow-up: Return for As scheduled for anatomy scan/LROB.  Orders Placed This Encounter  Procedures   AFP, Serum, Open Spina Bifida   Jacklyn Shell DNP, CNM 02/03/2022 3:21 PM

## 2022-02-03 NOTE — Patient Instructions (Signed)
[employer], I greatly value your feedback.  If you receive a survey following your visit with [name] today, we appreciate you taking the time to fill it out.  Thanks, Cathie Beams, CNM     [facility] HAS MOVED!!! It is now [facility] ([address] [place], [place] [address]) Entrance located off of [address] Free 24/7 valet parking   Go to [email or web address] to register for FREE online childbirth classes    Second Trimester of Pregnancy The second trimester is from week 14 through week 27 (months 4 through 6). The second trimester is often a time when you feel your best. Your body has adjusted to being pregnant, and you begin to feel better physically. Usually, morning sickness has lessened or quit completely, you may have more energy, and you may have an increase in appetite. The second trimester is also a time when the fetus is growing rapidly. At the end of the sixth month, the fetus is about 9 inches long and weighs about 1 pounds. You will likely begin to feel the baby move (quickening) between 16 and 20 weeks of pregnancy. Body changes during your second trimester Your body continues to go through many changes during your second trimester. The changes vary from woman to woman. Your weight will continue to increase. You will notice your lower abdomen bulging out. You may begin to get stretch marks on your hips, abdomen, and breasts. You may develop headaches that can be relieved by medicines. The medicines should be approved by your health care provider. You may urinate more often because the fetus is pressing on your bladder. You may develop or continue to have heartburn as a result of your pregnancy. You may develop constipation because certain hormones are causing the muscles that push waste through your intestines to slow down. You may develop hemorrhoids or swollen, bulging veins (varicose veins). You may have back pain. This is  caused by: Weight gain. Pregnancy hormones that are relaxing the joints in your pelvis. A shift in weight and the muscles that support your balance. Your breasts will continue to grow and they will continue to become tender. Your gums may bleed and may be sensitive to brushing and flossing. Dark spots or blotches (chloasma, mask of pregnancy) may develop on your face. This will likely fade after the baby is born. A dark line from your belly button to the pubic area (linea nigra) may appear. This will likely fade after the baby is born. You may have changes in your hair. These can include thickening of your hair, rapid growth, and changes in texture. Some women also have hair loss during or after pregnancy, or hair that feels dry or thin. Your hair will most likely return to normal after your baby is born.  What to expect at prenatal visits During a routine prenatal visit: You will be weighed to make sure you and the fetus are growing normally. Your blood pressure will be taken. Your abdomen will be measured to track your baby's growth. The fetal heartbeat will be listened to. Any test results from the previous visit will be discussed.  Your health care provider may ask you: How you are feeling. If you are feeling the baby move. If you have had any abnormal symptoms, such as leaking fluid, bleeding, severe headaches, or abdominal cramping. If you are using any tobacco products, including cigarettes, chewing tobacco, and electronic cigarettes. If you have any questions.  Other tests that  may be performed during your second trimester include: Blood tests that check for: Low iron levels (anemia). High blood sugar that affects pregnant women (gestational diabetes) between 66 and 28 weeks. Rh antibodies. This is to check for a protein on red blood cells (Rh factor). Urine tests to check for infections, diabetes, or protein in the urine. An ultrasound to confirm the proper growth and  development of the baby. An amniocentesis to check for possible genetic problems. Fetal screens for spina bifida and Down syndrome. HIV (human immunodeficiency virus) testing. Routine prenatal testing includes screening for HIV, unless you choose not to have this test.  Follow these instructions at home: Medicines Follow your health care provider's instructions regarding medicine use. Specific medicines may be either safe or unsafe to take during pregnancy. Take a prenatal vitamin that contains at least 600 micrograms (mcg) of folic acid. If you develop constipation, try taking a stool softener if your health care provider approves. Eating and drinking Eat a balanced diet that includes fresh fruits and vegetables, whole grains, good sources of protein such as meat, eggs, or tofu, and low-fat dairy. Your health care provider will help you determine the amount of weight gain that is right for you. Avoid raw meat and uncooked cheese. These carry germs that can cause birth defects in the baby. If you have low calcium intake from food, talk to your health care provider about whether you should take a daily calcium supplement. Limit foods that are high in fat and processed sugars, such as fried and sweet foods. To prevent constipation: Drink enough fluid to keep your urine clear or pale yellow. Eat foods that are high in fiber, such as fresh fruits and vegetables, whole grains, and beans. Activity Exercise only as directed by your health care provider. Most women can continue their usual exercise routine during pregnancy. Try to exercise for 30 minutes at least 5 days a week. Stop exercising if you experience uterine contractions. Avoid heavy lifting, wear low heel shoes, and practice good posture. A sexual relationship may be continued unless your health care provider directs you otherwise. Relieving pain and discomfort Wear a good support bra to prevent discomfort from breast tenderness. Take  warm sitz baths to soothe any pain or discomfort caused by hemorrhoids. Use hemorrhoid cream if your health care provider approves. Rest with your legs elevated if you have leg cramps or low back pain. If you develop varicose veins, wear support hose. Elevate your feet for 15 minutes, 3-4 times a day. Limit salt in your diet. Prenatal Care Write down your questions. Take them to your prenatal visits. Keep all your prenatal visits as told by your health care provider. This is important. Safety Wear your seat belt at all times when driving. Make a list of emergency phone numbers, including numbers for family, friends, the hospital, and police and fire departments. General instructions Ask your health care provider for a referral to a local prenatal education class. Begin classes no later than the beginning of month 6 of your pregnancy. Ask for help if you have counseling or nutritional needs during pregnancy. Your health care provider can offer advice or refer you to specialists for help with various needs. Do not use hot tubs, steam rooms, or saunas. Do not douche or use tampons or scented sanitary pads. Do not cross your legs for long periods of time. Avoid cat litter boxes and soil used by cats. These carry germs that can cause birth defects in the baby and  possibly loss of the fetus by miscarriage or stillbirth. Avoid all smoking, herbs, alcohol, and unprescribed drugs. Chemicals in these products can affect the formation and growth of the baby. Do not use any products that contain nicotine or tobacco, such as cigarettes and e-cigarettes. If you need help quitting, ask your health care provider. Visit your dentist if you have not gone yet during your pregnancy. Use a soft toothbrush to brush your teeth and be gentle when you floss. Contact a health care provider if: You have dizziness. You have mild pelvic cramps, pelvic pressure, or nagging pain in the abdominal area. You have persistent  nausea, vomiting, or diarrhea. You have a bad smelling vaginal discharge. You have pain when you urinate. Get help right away if: You have a fever. You are leaking fluid from your vagina. You have spotting or bleeding from your vagina. You have severe abdominal cramping or pain. You have rapid weight gain or weight loss. You have shortness of breath with chest pain. You notice sudden or extreme swelling of your face, hands, ankles, feet, or legs. You have not felt your baby move in over an hour. You have severe headaches that do not go away when you take medicine. You have vision changes. Summary The second trimester is from week 14 through week 27 (months 4 through 6). It is also a time when the fetus is growing rapidly. Your body goes through many changes during pregnancy. The changes vary from woman to woman. Avoid all smoking, herbs, alcohol, and unprescribed drugs. These chemicals affect the formation and growth your baby. Do not use any tobacco products, such as cigarettes, chewing tobacco, and e-cigarettes. If you need help quitting, ask your health care provider. Contact your health care provider if you have any questions. Keep all prenatal visits as told by your health care provider. This is important. This information is not intended to replace advice given to you by your health care provider. Make sure you discuss any questions you have with your health care provider.

## 2022-02-05 LAB — AFP, SERUM, OPEN SPINA BIFIDA
AFP MoM: 0.96
AFP Value: 26.3 ng/mL
Gest. Age on Collection Date: 17.2 weeks
Maternal Age At EDD: 24.1 yr
OSBR Risk 1 IN: 10000
Test Results:: NEGATIVE
Weight: 294 [lb_av]

## 2022-02-22 ENCOUNTER — Ambulatory Visit (INDEPENDENT_AMBULATORY_CARE_PROVIDER_SITE_OTHER): Payer: Medicaid Other

## 2022-02-22 ENCOUNTER — Ambulatory Visit (INDEPENDENT_AMBULATORY_CARE_PROVIDER_SITE_OTHER): Payer: Medicaid Other | Admitting: Women's Health

## 2022-02-22 ENCOUNTER — Encounter: Payer: Self-pay | Admitting: Women's Health

## 2022-02-22 VITALS — BP 106/66 | HR 78 | Wt 293.8 lb

## 2022-02-22 DIAGNOSIS — Z3A20 20 weeks gestation of pregnancy: Secondary | ICD-10-CM | POA: Diagnosis not present

## 2022-02-22 DIAGNOSIS — Z3482 Encounter for supervision of other normal pregnancy, second trimester: Secondary | ICD-10-CM

## 2022-02-22 DIAGNOSIS — Z363 Encounter for antenatal screening for malformations: Secondary | ICD-10-CM | POA: Diagnosis not present

## 2022-02-22 DIAGNOSIS — Z3480 Encounter for supervision of other normal pregnancy, unspecified trimester: Secondary | ICD-10-CM

## 2022-02-22 NOTE — Progress Notes (Signed)
[name] 20 wks,cephalic,anterior placenta gr 0,normal ovaries,CX 3.1 cm,SVP of fluid 5.4 cm,FHR 164 bpm,EFW 306 g 28%,anatomy complete,no obvious abnormalities

## 2022-02-22 NOTE — Patient Instructions (Signed)
Stefanie Farmer, thank you for choosing our office today! We appreciate the opportunity to meet your healthcare needs. You may receive a short survey by mail, e-mail, or through MyChart. If you are happy with your care we would appreciate if you could take just a few minutes to complete the survey questions. We read all of your comments and take your feedback very seriously. Thank you again for choosing our office.  [facility] Team at [facility] ([address], [place] [address]) Entrance C, located off of [address] Free 24/7 valet parking  Go to [email or web address] to register for FREE online childbirth classes  Call the office ([phone]) or go to [facility] if: You begin to severe cramping Your water breaks.  Sometimes it is a big gush of fluid, sometimes it is just a trickle that keeps getting your panties wet or running down your legs You have vaginal bleeding.  It is normal to have a small amount of spotting if your cervix was checked.   [place] Pediatricians/Family Doctors [facility] ([facility]): [address], [phone]           [facility]: [address], [phone]                [facility] ([facility]): [address], [phone] (call to ask if accepting patients) [facility]: [address], [place], [phone]    [place] Pediatricians/Family Doctors [facility] ([facility]): [address], [phone] [facility]: [address], [phone] [facility]: [address], [phone]  [place] [facility]  [facility] ([facility]): [phone] [facility]: [address], [phone]   [place] [facility]: [address], [phone]  [place] [facility]  [facility]: [address], [phone]  Home Blood Pressure Monitoring for Patients   Your provider has recommended that you check your blood pressure (BP) at least once a week at home. If you do not have a blood pressure cuff at home, one will be provided for you. Contact your provider if you have not received your monitor within 1 week.   Helpful Tips for Accurate Home Blood Pressure Checks  Don't smoke, exercise, or drink caffeine 30 minutes before checking your BP Use the restroom before checking your BP (a full bladder can raise your pressure) Relax in a comfortable upright chair Feet on the ground Left arm resting comfortably on a flat surface at the level of your heart Legs uncrossed Back supported Sit quietly and don't talk Place the cuff on your bare arm Adjust snuggly, so that only two fingertips can fit between your skin and the top of the cuff Check 2 readings separated by at least one minute Keep a log of your BP readings For a visual, please reference this diagram: http://ccnc.care/bpdiagram  Provider Name: [facility]     Phone: [phone]  Zone 1: ALL CLEAR  Continue to monitor your symptoms:  BP reading is less than 140 (top number) or less than 90 (bottom number)  No right upper stomach pain No headaches or seeing spots No feeling nauseated or throwing up No swelling in face and hands  Zone 2: CAUTION Call your doctor's office for any of the following:  BP reading is greater than 140 (top number) or greater than   90 (bottom number)  Stomach pain under your ribs in the middle or right side Headaches or seeing spots Feeling nauseated or throwing up Swelling in face and hands  Zone 3: EMERGENCY  Seek immediate medical care if you have any of the following:  BP reading is greater than160 (top number) or greater than 110 (bottom number) Severe headaches not improving with Tylenol Serious difficulty catching your breath Any worsening symptoms from  Zone 2     Second Trimester of Pregnancy The second trimester is from week 14 through week 27 (months 4 through 6). The second trimester is often a time when you feel your best. Your body has adjusted to being pregnant, and you begin to feel better physically. Usually, morning sickness has lessened or quit completely, you may have more energy, and you may have an increase in appetite. The second trimester is also a time when the fetus is growing rapidly. At the end of the sixth month, the fetus is about 9 inches long and weighs about 1 pounds. You will likely begin to feel the baby move (quickening) between 16 and 20 weeks of pregnancy. Body changes during your second trimester Your body continues to go through many changes during your second trimester. The changes vary from woman to woman. Your weight will continue to increase. You will notice your lower abdomen bulging out. You may begin to get stretch marks on your hips, abdomen, and breasts. You may develop headaches that can be relieved by medicines. The medicines should be approved by your health care provider. You may urinate more often because the fetus is pressing on your bladder. You may develop or continue to have heartburn as a result of your pregnancy. You may develop constipation because certain hormones are causing the muscles that push waste through your intestines to slow down. You may develop hemorrhoids or swollen, bulging veins (varicose veins). You may have back pain. This is caused by: Weight gain. Pregnancy hormones that are relaxing the joints in your pelvis. A shift in weight and the muscles that support your balance. Your breasts will continue to grow and they will continue to become tender. Your gums may bleed and may be sensitive to brushing and flossing. Dark spots or blotches (chloasma, mask of pregnancy) may develop on your face. This will likely fade after the baby is born. A dark line from your belly button to  the pubic area (linea nigra) may appear. This will likely fade after the baby is born. You may have changes in your hair. These can include thickening of your hair, rapid growth, and changes in texture. Some women also have hair loss during or after pregnancy, or hair that feels dry or thin. Your hair will most likely return to normal after your baby is born.  What to expect at prenatal visits During a routine prenatal visit: You will be weighed to make sure you and the fetus are growing normally. Your blood pressure will be taken. Your abdomen will be measured to track your baby's growth. The fetal heartbeat will be listened to. Any test results from the previous visit will be discussed.  Your health care provider may ask you: How you are feeling. If you are feeling the baby move. If you have had any abnormal symptoms, such as leaking fluid, bleeding, severe headaches, or abdominal cramping. If you are using any tobacco products, including cigarettes, chewing tobacco, and electronic cigarettes. If you have any questions.  Other tests that may be performed during   your second trimester include: Blood tests that check for: Low iron levels (anemia). High blood sugar that affects pregnant women (gestational diabetes) between 24 and 28 weeks. Rh antibodies. This is to check for a protein on red blood cells (Rh factor). Urine tests to check for infections, diabetes, or protein in the urine. An ultrasound to confirm the proper growth and development of the baby. An amniocentesis to check for possible genetic problems. Fetal screens for spina bifida and Down syndrome. HIV (human immunodeficiency virus) testing. Routine prenatal testing includes screening for HIV, unless you choose not to have this test.  Follow these instructions at home: Medicines Follow your health care provider's instructions regarding medicine use. Specific medicines may be either safe or unsafe to take during  pregnancy. Take a prenatal vitamin that contains at least 600 micrograms (mcg) of folic acid. If you develop constipation, try taking a stool softener if your health care provider approves. Eating and drinking Eat a balanced diet that includes fresh fruits and vegetables, whole grains, good sources of protein such as meat, eggs, or tofu, and low-fat dairy. Your health care provider will help you determine the amount of weight gain that is right for you. Avoid raw meat and uncooked cheese. These carry germs that can cause birth defects in the baby. If you have low calcium intake from food, talk to your health care provider about whether you should take a daily calcium supplement. Limit foods that are high in fat and processed sugars, such as fried and sweet foods. To prevent constipation: Drink enough fluid to keep your urine clear or pale yellow. Eat foods that are high in fiber, such as fresh fruits and vegetables, whole grains, and beans. Activity Exercise only as directed by your health care provider. Most women can continue their usual exercise routine during pregnancy. Try to exercise for 30 minutes at least 5 days a week. Stop exercising if you experience uterine contractions. Avoid heavy lifting, wear low heel shoes, and practice good posture. A sexual relationship may be continued unless your health care provider directs you otherwise. Relieving pain and discomfort Wear a good support bra to prevent discomfort from breast tenderness. Take warm sitz baths to soothe any pain or discomfort caused by hemorrhoids. Use hemorrhoid cream if your health care provider approves. Rest with your legs elevated if you have leg cramps or low back pain. If you develop varicose veins, wear support hose. Elevate your feet for 15 minutes, 3-4 times a day. Limit salt in your diet. Prenatal Care Write down your questions. Take them to your prenatal visits. Keep all your prenatal visits as told by your health  care provider. This is important. Safety Wear your seat belt at all times when driving. Make a list of emergency phone numbers, including numbers for family, friends, the hospital, and police and fire departments. General instructions Ask your health care provider for a referral to a local prenatal education class. Begin classes no later than the beginning of month 6 of your pregnancy. Ask for help if you have counseling or nutritional needs during pregnancy. Your health care provider can offer advice or refer you to specialists for help with various needs. Do not use hot tubs, steam rooms, or saunas. Do not douche or use tampons or scented sanitary pads. Do not cross your legs for long periods of time. Avoid cat litter boxes and soil used by cats. These carry germs that can cause birth defects in the baby and possibly loss of the   fetus by miscarriage or stillbirth. Avoid all smoking, herbs, alcohol, and unprescribed drugs. Chemicals in these products can affect the formation and growth of the baby. Do not use any products that contain nicotine or tobacco, such as cigarettes and e-cigarettes. If you need help quitting, ask your health care provider. Visit your dentist if you have not gone yet during your pregnancy. Use a soft toothbrush to brush your teeth and be gentle when you floss. Contact a health care provider if: You have dizziness. You have mild pelvic cramps, pelvic pressure, or nagging pain in the abdominal area. You have persistent nausea, vomiting, or diarrhea. You have a bad smelling vaginal discharge. You have pain when you urinate. Get help right away if: You have a fever. You are leaking fluid from your vagina. You have spotting or bleeding from your vagina. You have severe abdominal cramping or pain. You have rapid weight gain or weight loss. You have shortness of breath with chest pain. You notice sudden or extreme swelling of your face, hands, ankles, feet, or legs. You  have not felt your baby move in over an hour. You have severe headaches that do not go away when you take medicine. You have vision changes. Summary The second trimester is from week 14 through week 27 (months 4 through 6). It is also a time when the fetus is growing rapidly. Your body goes through many changes during pregnancy. The changes vary from woman to woman. Avoid all smoking, herbs, alcohol, and unprescribed drugs. These chemicals affect the formation and growth your baby. Do not use any tobacco products, such as cigarettes, chewing tobacco, and e-cigarettes. If you need help quitting, ask your health care provider. Contact your health care provider if you have any questions. Keep all prenatal visits as told by your health care provider. This is important. This information is not intended to replace advice given to you by your health care provider. Make sure you discuss any questions you have with your health care provider. Document Released: 05/11/2001 Document Revised: 10/23/2015 Document Reviewed: 07/18/2012 Elsevier Interactive Patient Education  2017 [employer]vier Inc.  

## 2022-02-22 NOTE — Progress Notes (Signed)
LOW-RISK PREGNANCY VISIT Patient name: Stefanie Farmer MRN [id number]  Date of birth: 1996/03/11 Chief Complaint:   Routine Prenatal Visit (Anatomy scan)  History of Present Illness:   Stefanie Farmer is a 26 y.o. G52P0010 female at [redacted]w[redacted]d with an Estimated Date of Delivery: 07/12/22 being seen today for ongoing management of a low-risk pregnancy.   Today she reports no complaints. Contractions: Not present. Vag. Bleeding: None.  Movement: Present. denies leaking of fluid.     01/01/2022   10:49 AM 09/15/2021   11:14 AM  Depression screen PHQ 2/9  Decreased Interest 0 0  Down, Depressed, Hopeless 0 0  PHQ - 2 Score 0 0  Altered sleeping 2 1  Tired, decreased energy 2 0  Change in appetite 2 1  Feeling bad or failure about yourself  0 0  Trouble concentrating 0 0  Moving slowly or fidgety/restless 0 0  Suicidal thoughts 0 0  PHQ-9 Score 6 2        01/01/2022   10:49 AM 09/15/2021   11:14 AM  GAD 7 : Generalized Anxiety Score  Nervous, Anxious, on Edge 0 1  Control/stop worrying 0 0  Worry too much - different things 0 0  Trouble relaxing 0 0  Restless 0 0  Easily annoyed or irritable 0 0  Afraid - awful might happen 0 0  Total GAD 7 Score 0 1      Review of Systems:   Pertinent items are noted in HPI Denies abnormal vaginal discharge w/ itching/odor/irritation, headaches, visual changes, shortness of breath, chest pain, abdominal pain, severe nausea/vomiting, or problems with urination or bowel movements unless otherwise stated above. Pertinent History Reviewed:  Reviewed past medical,surgical, social, obstetrical and family history.  Reviewed problem list, medications and allergies. Physical Assessment:   Vitals:   02/22/22 0934  BP: 106/66  Pulse: 78  Weight: 293 lb 12.8 oz (133.3 kg)  Body mass index is 50.43 kg/m.        Physical Examination:   General appearance: Well appearing, and in no distress  Mental status: Alert, oriented to person, place, and  time  Skin: Warm & dry  Cardiovascular: Normal heart rate noted  Respiratory: Normal respiratory effort, no distress  Abdomen: Soft, gravid, nontender  Pelvic: Cervical exam deferred         Extremities: Edema: None  Fetal Status:     Movement: Present  [name] 20 wks,cephalic,anterior placenta gr 0,normal ovaries,CX 3.1 cm,SVP of fluid 5.4 cm,FHR 164 bpm,EFW 306 g 28%,anatomy complete,no obvious abnormalities   Chaperone: N/A   No results found for this or any previous visit (from the past 24 hour(s)).  Assessment & Plan:  1) Low-risk pregnancy G2P0010 at [redacted]w[redacted]d with an Estimated Date of Delivery: 07/12/22    Meds: No orders of the defined types were placed in this encounter.  Labs/procedures today: U/S  Plan:  Continue routine obstetrical care  Next visit: prefers in person    Reviewed: Preterm labor symptoms and general obstetric precautions including but not limited to vaginal bleeding, contractions, leaking of fluid and fetal movement were reviewed in detail with the patient.  All questions were answered. Does have home bp cuff. Office bp cuff given: not applicable. Check bp weekly, let us know if consistently >140 and/or >90.  Follow-up: No follow-ups on file.  Future Appointments  Date Time Provider [place]  04/09/2022 11:30 AM McKenzie, Candee Furbish, MD [facility] None    No orders of the defined types were placed  in this encounter.  Cheral Marker CNM, [facility] 02/22/2022 10:04 AM

## 2022-03-22 ENCOUNTER — Ambulatory Visit (INDEPENDENT_AMBULATORY_CARE_PROVIDER_SITE_OTHER): Payer: Medicaid Other | Admitting: Women's Health

## 2022-03-22 ENCOUNTER — Encounter: Payer: Self-pay | Admitting: Women's Health

## 2022-03-22 VITALS — BP 136/82 | HR 91 | Wt 308.0 lb

## 2022-03-22 DIAGNOSIS — Z3480 Encounter for supervision of other normal pregnancy, unspecified trimester: Secondary | ICD-10-CM

## 2022-03-22 DIAGNOSIS — R030 Elevated blood-pressure reading, without diagnosis of hypertension: Secondary | ICD-10-CM

## 2022-03-22 DIAGNOSIS — Z3482 Encounter for supervision of other normal pregnancy, second trimester: Secondary | ICD-10-CM

## 2022-03-22 DIAGNOSIS — Z3A24 24 weeks gestation of pregnancy: Secondary | ICD-10-CM

## 2022-03-22 LAB — POCT URINALYSIS DIPSTICK
Glucose, UA: NEGATIVE
Ketones, UA: NEGATIVE
Leukocytes, UA: NEGATIVE
Nitrite, UA: NEGATIVE
Protein, UA: POSITIVE — AB

## 2022-03-22 LAB — POCT URINALYSIS DIPSTICK OB
Glucose, UA: NEGATIVE
Ketones, UA: NEGATIVE
Leukocytes, UA: NEGATIVE
Nitrite, UA: NEGATIVE

## 2022-03-22 NOTE — Progress Notes (Addendum)
LOW-RISK PREGNANCY VISIT Patient name: Stefanie Farmer MRN [id number]  Date of birth: June 25, 1995 Chief Complaint:   Routine Prenatal Visit  History of Present Illness:   Stefanie Farmer is a 26 y.o. G4P0010 female at [redacted]w[redacted]d with an Estimated Date of Delivery: 07/12/22 being seen today for ongoing management of a low-risk pregnancy.   Today she reports  had epigastric pain w/ reflux recently, took TUMS, mom called after hours line and told her she was having chest pain, they recommended calling 911, which her mom did although pt's pain/reflux went away w/ TUMS, EMS checked her out and said bp was a little elevated . Has been having some headaches, wakes up in morning w/ them, doesn't like to take apap- eventually just go away on their own. Denies visual changes, ruq/epigastric pain, n/v.  Did have 1 elevated bp in April (right before pregnancy), no others. 15lb wt gain in 4wks, does feel her diet is not good. Hasn't noticed increased swelling.  Contractions: Not present. Vag. Bleeding: None.  Movement: Present. denies leaking of fluid.     01/01/2022   10:49 AM 09/15/2021   11:14 AM  Depression screen PHQ 2/9  Decreased Interest 0 0  Down, Depressed, Hopeless 0 0  PHQ - 2 Score 0 0  Altered sleeping 2 1  Tired, decreased energy 2 0  Change in appetite 2 1  Feeling bad or failure about yourself  0 0  Trouble concentrating 0 0  Moving slowly or fidgety/restless 0 0  Suicidal thoughts 0 0  PHQ-9 Score 6 2        01/01/2022   10:49 AM 09/15/2021   11:14 AM  GAD 7 : Generalized Anxiety Score  Nervous, Anxious, on Edge 0 1  Control/stop worrying 0 0  Worry too much - different things 0 0  Trouble relaxing 0 0  Restless 0 0  Easily annoyed or irritable 0 0  Afraid - awful might happen 0 0  Total GAD 7 Score 0 1      Review of Systems:   Pertinent items are noted in HPI Denies abnormal vaginal discharge w/ itching/odor/irritation, headaches, visual changes, shortness of breath, chest  pain, abdominal pain, severe nausea/vomiting, or problems with urination or bowel movements unless otherwise stated above. Pertinent History Reviewed:  Reviewed past medical,surgical, social, obstetrical and family history.  Reviewed problem list, medications and allergies. Physical Assessment:   Vitals:   03/22/22 1108 03/22/22 1111  BP: (!) 143/86 136/82  Pulse: 100 91  Weight: (!) 308 lb (139.7 kg)   Body mass index is 52.87 kg/m.        Physical Examination:   General appearance: Well appearing, and in no distress  Mental status: Alert, oriented to person, place, and time  Skin: Warm & dry  Cardiovascular: Normal heart rate noted  Respiratory: Normal respiratory effort, no distress  Abdomen: Soft, gravid, nontender  Pelvic: Cervical exam deferred         Extremities: Edema: None  Fetal Status: Fetal Heart Rate (bpm): 158 Fundal Height: 24 cm Movement: Present    Chaperone: N/A   Results for orders placed or performed in visit on 03/22/22 (from the past 24 hour(s))  POCT Urinalysis Dipstick   Collection Time: 03/22/22 11:01 AM  Result Value Ref Range   Color, UA     Clarity, UA     Glucose, UA Negative Negative   Bilirubin, UA     Ketones, UA neg    Spec Grav, UA  Blood, UA trace    pH, UA     Protein, UA Positive (A) Negative   Urobilinogen, UA     Nitrite, UA neg    Leukocytes, UA Negative Negative   Appearance     Odor    POC Urinalysis Dipstick OB   Collection Time: 03/22/22 11:09 AM  Result Value Ref Range   Color, UA     Clarity, UA     Glucose, UA Negative Negative   Bilirubin, UA     Ketones, UA neg    Spec Grav, UA     Blood, UA trace    pH, UA     POC,PROTEIN,UA Trace Negative, Trace, Small (1+), Moderate (2+), Large (3+), 4+   Urobilinogen, UA     Nitrite, UA neg    Leukocytes, UA Negative Negative   Appearance     Odor      Assessment & Plan:  1) Low-risk pregnancy G2P0010 at [redacted]w[redacted]d with an Estimated Date of Delivery: 07/12/22   2)  Elevated bp, borderline on repeat, 1 elevated bp in April just before pregnancy, 15lb wt gain in 4wks (discussed reducing carbs, increasing protein, water as main source of hydration), tr proteinuria, some recent headaches. Will check pre-e labs, f/u tomorrow for bp check w/ nurse, bring home cuff. Reviewed pre-e s/s, reasons to seek care. Continue ASA   Meds: No orders of the defined types were placed in this encounter.  Labs/procedures today: pre-e labs, declined flu shot  Plan:  Continue routine obstetrical care  Next visit: prefers in person    Reviewed: Preterm labor symptoms and general obstetric precautions including but not limited to vaginal bleeding, contractions, leaking of fluid and fetal movement were reviewed in detail with the patient.  All questions were answered. Does have home bp cuff. Office bp cuff given: not applicable. Check bp daily, let us know if consistently >140 and/or >90.  Follow-up: Return in about 1 day (around 03/23/2022) for nurse bp check; then 4wks from now [place] w/ PN2.  Future Appointments  Date Time Provider [place]  04/09/2022 11:30 AM McKenzie, Candee Furbish, MD [facility] None    Orders Placed This Encounter  Procedures   CBC   Comprehensive metabolic panel   Protein / creatinine ratio, urine   POCT Urinalysis Dipstick   POC Urinalysis Dipstick OB   Roma Schanz CNM, [facility] 03/22/2022 11:35 AM

## 2022-03-22 NOTE — Patient Instructions (Signed)
Stefanie Farmer, thank you for choosing our office today! We appreciate the opportunity to meet your healthcare needs. You may receive a short survey by mail, e-mail, or through [employer]. If you are happy with your care we would appreciate if you could take just a few minutes to complete the survey questions. We read all of your comments and take your feedback very seriously. Thank you again for choosing our office.  Center for [employer] Team at [place] [facility] ([place], [place] [address]) Entrance C, located off of [address] parking   You will have your sugar test next visit.  Please do not eat or drink anything after midnight the night before you come, not even water.  You will be here for at least two hours.  Please make an appointment online for the bloodwork at [email or web address] for 8:00am (or as close to this as possible). Make sure you select the [facility] service center.   CLASSES: Go to [email or web address] to register for classes (childbirth, breastfeeding, waterbirth, infant CPR, daddy bootcamp, etc.)  Call the office [phone]) or go to [facility] if: You begin to have strong, frequent contractions Your water breaks.  Sometimes it is a big gush of fluid, sometimes it is just a trickle that keeps getting your panties wet or running down your legs You have vaginal bleeding.  It is normal to have a small amount of spotting if your cervix was checked.  You don't feel your baby moving like normal.  If you don't, get you something to eat and drink and lay down and focus on feeling your baby move.   If your baby is still not moving like normal, you should call the office or go to [facility].  Call the office [phone]) or go to [facility] hospital for these signs of pre-eclampsia: Severe headache that does not go away with Tylenol Visual changes- seeing spots, double, blurred vision Pain under your right breast or upper  abdomen that does not go away with Tums or heartburn medicine Nausea and/or vomiting Severe swelling in your hands, feet, and face    [facility] Pediatricians/Family Doctors [facility]): [address] [place], [place]: [address] [place], [facility]): [place], [phone] (call to ask if accepting patients) [facility] Department: [address], [place], [facility] [facility]): [address], [place] [facility]: [address] [place], [place] [facility]: [place], [place] [facility]): [phone] [facility]: [address] [place]: [address], [place] [facility]: [phone], [phone]  Home Blood Pressure Monitoring for Patients   Your provider has recommended that you check your blood pressure (BP) at least once a week at home. If you do not have a blood pressure cuff at home, one will be provided for you. Contact your provider if you have not received your monitor within 1 week.   Helpful Tips for Accurate Home Blood Pressure Checks  Don't smoke, exercise, or drink  caffeine 30 minutes before checking your BP Use the restroom before checking your BP (a full bladder can raise your pressure) Relax in a comfortable upright chair Feet on the ground Left arm resting comfortably on a flat surface at the level of your heart Legs uncrossed Back supported Sit quietly and don't talk Place the cuff on your bare arm Adjust snuggly, so that only two fingertips can fit between your skin and the top of the cuff Check 2  readings separated by at least one minute Keep a log of your BP readings For a visual, please reference this diagram: http://ccnc.care/bpdiagram  Provider Name: [facility]     Phone: [phone]  Zone 1: ALL CLEAR  Continue to monitor your symptoms:  BP reading is less than 140 (top number) or less than 90 (bottom number)  No right upper stomach pain No headaches or seeing spots No feeling nauseated or throwing up No swelling in face and hands  Zone 2: CAUTION Call your doctor's office for any of the following:  BP reading is greater than 140 (top number) or greater than 90 (bottom number)  Stomach pain under your ribs in the middle or right side Headaches or seeing spots Feeling nauseated or throwing up Swelling in face and hands  Zone 3: EMERGENCY  Seek immediate medical care if you have any of the following:  BP reading is greater than160 (top number) or greater than 110 (bottom number) Severe headaches not improving with Tylenol Serious difficulty catching your breath Any worsening symptoms from Zone 2   Second Trimester of Pregnancy The second trimester is from week 13 through week 28, months 4 through 6. The second trimester is often a time when you feel your best. Your body has also adjusted to being pregnant, and you begin to feel better physically. Usually, morning sickness has lessened or quit completely, you may have more energy, and you may have an increase in appetite. The second trimester is also a time when the fetus is growing rapidly. At the end of the sixth month, the fetus is about 9 inches long and weighs about 1 pounds. You will likely begin to feel the baby move (quickening) between 18 and 20 weeks of the pregnancy. BODY CHANGES Your body goes through many changes during pregnancy. The changes vary from woman to woman.  Your weight will continue to increase. You will notice your lower abdomen bulging out. You may begin to get stretch marks on your  hips, abdomen, and breasts. You may develop headaches that can be relieved by medicines approved by your health care provider. You may urinate more often because the fetus is pressing on your bladder. You may develop or continue to have heartburn as a result of your pregnancy. You may develop constipation because certain hormones are causing the muscles that push waste through your intestines to slow down. You may develop hemorrhoids or swollen, bulging veins (varicose veins). You may have back pain because of the weight gain and pregnancy hormones relaxing your joints between the bones in your pelvis and as a result of a shift in weight and the muscles that support your balance. Your breasts will continue to grow and be tender. Your gums may bleed and may be sensitive to brushing and flossing. Dark spots or blotches (chloasma, mask of pregnancy) may develop on your face. This will likely fade after the baby is born. A dark line from your belly button to the pubic area (linea nigra) may appear. This  will likely fade after the baby is born. You may have changes in your hair. These can include thickening of your hair, rapid growth, and changes in texture. Some women also have hair loss during or after pregnancy, or hair that feels dry or thin. Your hair will most likely return to normal after your baby is born. WHAT TO EXPECT AT YOUR PRENATAL VISITS During a routine prenatal visit: You will be weighed to make sure you and the fetus are growing normally. Your blood pressure will be taken. Your abdomen will be measured to track your baby's growth. The fetal heartbeat will be listened to. Any test results from the previous visit will be discussed. Your health care provider may ask you: How you are feeling. If you are feeling the baby move. If you have had any abnormal symptoms, such as leaking fluid, bleeding, severe headaches, or abdominal cramping. If you have any questions. Other tests that may  be performed during your second trimester include: Blood tests that check for: Low iron levels (anemia). Gestational diabetes (between 24 and 28 weeks). Rh antibodies. Urine tests to check for infections, diabetes, or protein in the urine. An ultrasound to confirm the proper growth and development of the baby. An amniocentesis to check for possible genetic problems. Fetal screens for spina bifida and Down syndrome. HOME CARE INSTRUCTIONS  Avoid all smoking, herbs, alcohol, and unprescribed drugs. These chemicals affect the formation and growth of the baby. Follow your health care provider's instructions regarding medicine use. There are medicines that are either safe or unsafe to take during pregnancy. Exercise only as directed by your health care provider. Experiencing uterine cramps is a good sign to stop exercising. Continue to eat regular, healthy meals. Wear a good support bra for breast tenderness. Do not use hot tubs, steam rooms, or saunas. Wear your seat belt at all times when driving. Avoid raw meat, uncooked cheese, cat litter boxes, and soil used by cats. These carry germs that can cause birth defects in the baby. Take your prenatal vitamins. Try taking a stool softener (if your health care provider approves) if you develop constipation. Eat more high-fiber foods, such as fresh vegetables or fruit and whole grains. Drink plenty of fluids to keep your urine clear or pale yellow. Take warm sitz baths to soothe any pain or discomfort caused by hemorrhoids. Use hemorrhoid cream if your health care provider approves. If you develop varicose veins, wear support hose. Elevate your feet for 15 minutes, 3-4 times a day. Limit salt in your diet. Avoid heavy lifting, wear low heel shoes, and practice good posture. Rest with your legs elevated if you have leg cramps or low back pain. Visit your dentist if you have not gone yet during your pregnancy. Use a soft toothbrush to brush your teeth  and be gentle when you floss. A sexual relationship may be continued unless your health care provider directs you otherwise. Continue to go to all your prenatal visits as directed by your health care provider. SEEK MEDICAL CARE IF:  You have dizziness. You have mild pelvic cramps, pelvic pressure, or nagging pain in the abdominal area. You have persistent nausea, vomiting, or diarrhea. You have a bad smelling vaginal discharge. You have pain with urination. SEEK IMMEDIATE MEDICAL CARE IF:  You have a fever. You are leaking fluid from your vagina. You have spotting or bleeding from your vagina. You have severe abdominal cramping or pain. You have rapid weight gain or loss. You have shortness of   breath with chest pain. You notice sudden or extreme swelling of your face, hands, ankles, feet, or legs. You have not felt your baby move in over an hour. You have severe headaches that do not go away with medicine. You have vision changes. Document Released: 05/11/2001 Document Revised: 05/22/2013 Document Reviewed: 07/18/2012 [facility] Patient Information 2015 [place], [place]. This information is not intended to replace advice given to you by your health care provider. Make sure you discuss any questions you have with your health care provider.

## 2022-03-23 ENCOUNTER — Ambulatory Visit (INDEPENDENT_AMBULATORY_CARE_PROVIDER_SITE_OTHER): Payer: Medicaid Other | Admitting: *Deleted

## 2022-03-23 VITALS — BP 118/80 | HR 94

## 2022-03-23 DIAGNOSIS — Z3480 Encounter for supervision of other normal pregnancy, unspecified trimester: Secondary | ICD-10-CM

## 2022-03-23 DIAGNOSIS — Z0130 Encounter for examination of blood pressure without abnormal findings: Secondary | ICD-10-CM

## 2022-03-23 NOTE — Progress Notes (Signed)
   NURSE VISIT- BLOOD PRESSURE CHECK  SUBJECTIVE:  Stefanie Farmer is a 26 y.o. G90P0010 female here for BP check. She is [redacted]w[redacted]d pregnant    HYPERTENSION ROS:  Pregnant:  Severe headaches that don't go away with tylenol/other medicines: No  Visual changes (seeing spots/double/blurred vision) No  Severe pain under right breast breast or in center of upper chest No  Severe nausea/vomiting No  Taking medicines as instructed not applicable    OBJECTIVE:  BP 118/80   Pulse 94   LMP 10/05/2021   Appearance alert, well appearing, and in no distress. Brought home cuff with her to visit. Reading 123/79  ASSESSMENT: Pregnancy [redacted]w[redacted]d  blood pressure check  PLAN: Discussed with Knute Neu, CNM, [facility]   Recommendations: no changes needed, advised to check bp daily and if readings >140/90, then to let us know.  Follow-up: as scheduled   Alice Rieger  03/23/2022 10:54 AM

## 2022-03-24 LAB — CBC
Hematocrit: 31.7 % — ABNORMAL LOW (ref 34.0–46.6)
Hemoglobin: 10.5 g/dL — ABNORMAL LOW (ref 11.1–15.9)
MCH: 30.2 pg (ref 26.6–33.0)
MCHC: 33.1 g/dL (ref 31.5–35.7)
MCV: 91 fL (ref 79–97)
Platelets: 289 10*3/uL (ref 150–450)
RBC: 3.48 x10E6/uL — ABNORMAL LOW (ref 3.77–5.28)
RDW: 13.6 % (ref 11.7–15.4)
WBC: 10.4 10*3/uL (ref 3.4–10.8)

## 2022-03-24 LAB — COMPREHENSIVE METABOLIC PANEL
ALT: 14 IU/L (ref 0–32)
AST: 14 IU/L (ref 0–40)
Albumin/Globulin Ratio: 1.4 (ref 1.2–2.2)
Albumin: 3.6 g/dL — ABNORMAL LOW (ref 4.0–5.0)
Alkaline Phosphatase: 86 IU/L (ref 44–121)
BUN/Creatinine Ratio: 10 (ref 9–23)
BUN: 5 mg/dL — ABNORMAL LOW (ref 6–20)
Bilirubin Total: <0.2 mg/dL (ref 0.0–1.2)
CO2: 20 mmol/L (ref 20–29)
Calcium: 9.1 mg/dL (ref 8.7–10.2)
Chloride: 103 mmol/L (ref 96–106)
Creatinine, Ser: 0.49 mg/dL — ABNORMAL LOW (ref 0.57–1.00)
Globulin, Total: 2.6 g/dL (ref 1.5–4.5)
Glucose: 67 mg/dL — ABNORMAL LOW (ref 70–99)
Potassium: 4.4 mmol/L (ref 3.5–5.2)
Sodium: 137 mmol/L (ref 134–144)
Total Protein: 6.2 g/dL (ref 6.0–8.5)
eGFR: 136 mL/min/{1.73_m2} (ref >59)

## 2022-03-24 LAB — PROTEIN / CREATININE RATIO, URINE
Creatinine, Urine: 157.4 mg/dL
Protein, Ur: 24.8 mg/dL
Protein/Creat Ratio: 158 mg/g creat (ref 0–200)

## 2022-04-09 ENCOUNTER — Ambulatory Visit: Payer: 59 | Admitting: Urology

## 2022-04-19 ENCOUNTER — Ambulatory Visit (INDEPENDENT_AMBULATORY_CARE_PROVIDER_SITE_OTHER): Payer: Medicaid Other | Admitting: Obstetrics & Gynecology

## 2022-04-19 ENCOUNTER — Other Ambulatory Visit: Payer: Medicaid Other

## 2022-04-19 VITALS — BP 139/87 | HR 87 | Wt 318.4 lb

## 2022-04-19 DIAGNOSIS — Z3403 Encounter for supervision of normal first pregnancy, third trimester: Secondary | ICD-10-CM

## 2022-04-19 DIAGNOSIS — Z3480 Encounter for supervision of other normal pregnancy, unspecified trimester: Secondary | ICD-10-CM

## 2022-04-19 DIAGNOSIS — Z3A27 27 weeks gestation of pregnancy: Secondary | ICD-10-CM

## 2022-04-19 DIAGNOSIS — Z3A28 28 weeks gestation of pregnancy: Secondary | ICD-10-CM | POA: Diagnosis not present

## 2022-04-19 DIAGNOSIS — Z131 Encounter for screening for diabetes mellitus: Secondary | ICD-10-CM

## 2022-04-19 NOTE — Progress Notes (Signed)
   LOW-RISK PREGNANCY VISIT Patient name: Stefanie Farmer MRN [id number]  Date of birth: [birth year] Chief Complaint:   Routine Prenatal Visit  History of Present Illness:   Stefanie Farmer is a 26 y.o. G39P0010 female at [redacted]w[redacted]d with an Estimated Date of Delivery: 07/12/22 being seen today for ongoing management of a low-risk pregnancy.   -Morbid obesity- current BMI 54    01/01/2022   10:49 AM 09/15/2021   11:14 AM  Depression screen PHQ 2/9  Decreased Interest 0 0  Down, Depressed, Hopeless 0 0  PHQ - 2 Score 0 0  Altered sleeping 2 1  Tired, decreased energy 2 0  Change in appetite 2 1  Feeling bad or failure about yourself  0 0  Trouble concentrating 0 0  Moving slowly or fidgety/restless 0 0  Suicidal thoughts 0 0  PHQ-9 Score 6 2    Today she reports no complaints. Contractions: Not present. Vag. Bleeding: None.  Movement: Present. denies leaking of fluid. Review of Systems:   Pertinent items are noted in HPI Denies abnormal vaginal discharge w/ itching/odor/irritation, headaches, visual changes, shortness of breath, chest pain, abdominal pain, severe nausea/vomiting, or problems with urination or bowel movements unless otherwise stated above. Pertinent History Reviewed:  Reviewed past medical,surgical, social, obstetrical and family history.  Reviewed problem list, medications and allergies.  Physical Assessment:   Vitals:   04/19/22 0905  BP: 139/87  Pulse: 87  Weight: (!) 318 lb 6.4 oz (144.4 kg)  Body mass index is 54.65 kg/m.        Physical Examination:   General appearance: Well appearing, and in no distress  Mental status: Alert, oriented to person, place, and time  Skin: Warm & dry  Respiratory: Normal respiratory effort, no distress  Abdomen: Soft, gravid, nontender  Pelvic: Cervical exam deferred         Extremities: Edema: Trace  Psych:  mood and affect appropriate  Fetal Status: Fetal Heart Rate (bpm): 140 Fundal Height: 31 cm Movement: Present     Chaperone: n/a    No results found for this or any previous visit (from the past 24 hour(s)).   Assessment & Plan:  1) Low-risk pregnancy G2P0010 at [redacted]w[redacted]d with an Estimated Date of Delivery: 07/12/22   -Morbid obesity Growth q 4wks BPP weekly @ 36wks   Meds: No orders of the defined types were placed in this encounter.  Labs/procedures today: PN2  Plan:  Continue routine obstetrical care as outlined above Next visit: prefers in person    Reviewed: Preterm labor symptoms and general obstetric precautions including but not limited to vaginal bleeding, contractions, leaking of fluid and fetal movement were reviewed in detail with the patient.  All questions were answered. Pt has home bp cuff. Check bp weekly, let us know if >140/90.   Follow-up: Return in about 2 weeks (around 05/03/2022) for LROB visit and 4wk growth 32-36 then weekly BPP @ 36wk.  No orders of the defined types were placed in this encounter.   Myna Hidalgo, DO Attending Obstetrician & Gynecologist, [facility] for [employer], [facility]

## 2022-04-20 ENCOUNTER — Other Ambulatory Visit: Payer: Self-pay | Admitting: Obstetrics & Gynecology

## 2022-04-20 DIAGNOSIS — O99013 Anemia complicating pregnancy, third trimester: Secondary | ICD-10-CM

## 2022-04-20 LAB — ANTIBODY SCREEN: Antibody Screen: NEGATIVE

## 2022-04-20 LAB — CBC
Hematocrit: 29.8 % — ABNORMAL LOW (ref 34.0–46.6)
Hemoglobin: 9.9 g/dL — ABNORMAL LOW (ref 11.1–15.9)
MCH: 30.3 pg (ref 26.6–33.0)
MCHC: 33.2 g/dL (ref 31.5–35.7)
MCV: 91 fL (ref 79–97)
Platelets: 273 10*3/uL (ref 150–450)
RBC: 3.27 x10E6/uL — ABNORMAL LOW (ref 3.77–5.28)
RDW: 13 % (ref 11.7–15.4)
WBC: 9.7 10*3/uL (ref 3.4–10.8)

## 2022-04-20 LAB — HIV ANTIBODY (ROUTINE TESTING W REFLEX): HIV Screen 4th Generation wRfx: NONREACTIVE

## 2022-04-20 LAB — GLUCOSE TOLERANCE, 2 HOURS W/ 1HR
Glucose, 1 hour: 136 mg/dL (ref 70–179)
Glucose, 2 hour: 73 mg/dL (ref 70–152)
Glucose, Fasting: 84 mg/dL (ref 70–91)

## 2022-04-20 LAB — RPR: RPR Ser Ql: NONREACTIVE

## 2022-04-20 MED ORDER — ACCRUFER 30 MG PO CAPS: 1.0000 | ORAL_CAPSULE | Freq: Two times a day (BID) | ORAL | 11 refills | Status: DC

## 2022-04-20 NOTE — Progress Notes (Signed)
Rx sent in for iron

## 2022-04-30 ENCOUNTER — Ambulatory Visit: Payer: 59 | Admitting: Urology

## 2022-05-03 ENCOUNTER — Ambulatory Visit (INDEPENDENT_AMBULATORY_CARE_PROVIDER_SITE_OTHER): Payer: Medicaid Other | Admitting: Women's Health

## 2022-05-03 ENCOUNTER — Encounter: Payer: Self-pay | Admitting: Women's Health

## 2022-05-03 VITALS — BP 148/89 | HR 104 | Wt 323.5 lb

## 2022-05-03 DIAGNOSIS — I10 Essential (primary) hypertension: Secondary | ICD-10-CM

## 2022-05-03 DIAGNOSIS — Z3A30 30 weeks gestation of pregnancy: Secondary | ICD-10-CM

## 2022-05-03 DIAGNOSIS — O0993 Supervision of high risk pregnancy, unspecified, third trimester: Secondary | ICD-10-CM

## 2022-05-03 DIAGNOSIS — Z3480 Encounter for supervision of other normal pregnancy, unspecified trimester: Secondary | ICD-10-CM

## 2022-05-03 DIAGNOSIS — O10919 Unspecified pre-existing hypertension complicating pregnancy, unspecified trimester: Secondary | ICD-10-CM

## 2022-05-03 LAB — POCT URINALYSIS DIPSTICK OB
Glucose, UA: NEGATIVE
Ketones, UA: NEGATIVE
Leukocytes, UA: NEGATIVE
Nitrite, UA: NEGATIVE
POC,PROTEIN,UA: NEGATIVE

## 2022-05-03 NOTE — Progress Notes (Addendum)
[place] PREGNANCY VISIT Patient name: Stefanie Farmer MRN [id number]  Date of birth: 01-05-1996 Chief Complaint:   Routine Prenatal Visit  History of Present Illness:   Stefanie Farmer is a 26 y.o. G75P0010 female at [redacted]w[redacted]d with an Estimated Date of Delivery: 07/12/22 being seen today for ongoing management of a high-risk pregnancy complicated by [facility] vs GHTN, morbid obesity.    Today she reports  nexium has helped a lot w/ reflux. Was having bad episodes of n/v, back pain, did happen after eating ribs, etc. None since started nexium . Denies ha, visual changes, ruq/epigastric pain, n/v.   Contractions: Not present. Vag. Bleeding: None.  Movement: Present. denies leaking of fluid.      01/01/2022   10:49 AM 09/15/2021   11:14 AM  Depression screen PHQ 2/9  Decreased Interest 0 0  Down, Depressed, Hopeless 0 0  PHQ - 2 Score 0 0  Altered sleeping 2 1  Tired, decreased energy 2 0  Change in appetite 2 1  Feeling bad or failure about yourself  0 0  Trouble concentrating 0 0  Moving slowly or fidgety/restless 0 0  Suicidal thoughts 0 0  PHQ-9 Score 6 2        01/01/2022   10:49 AM 09/15/2021   11:14 AM  GAD 7 : Generalized Anxiety Score  Nervous, Anxious, on Edge 0 1  Control/stop worrying 0 0  Worry too much - different things 0 0  Trouble relaxing 0 0  Restless 0 0  Easily annoyed or irritable 0 0  Afraid - awful might happen 0 0  Total GAD 7 Score 0 1     Review of Systems:   Pertinent items are noted in HPI Denies abnormal vaginal discharge w/ itching/odor/irritation, headaches, visual changes, shortness of breath, chest pain, abdominal pain, severe nausea/vomiting, or problems with urination or bowel movements unless otherwise stated above. Pertinent History Reviewed:  Reviewed past medical,surgical, social, obstetrical and family history.  Reviewed problem list, medications and allergies. Physical Assessment:   Vitals:   05/03/22 1411 05/03/22 1414  BP: (!) 142/84  (!) 148/89  Pulse: (!) 101 (!) 104  Weight: (!) 323 lb 8 oz (146.7 kg)   Body mass index is 55.53 kg/m.           Physical Examination:   General appearance: alert, well appearing, and in no distress  Mental status: alert, oriented to person, place, and time  Skin: warm & dry   Extremities:      Cardiovascular: normal heart rate noted  Respiratory: normal respiratory effort, no distress  Abdomen: gravid, soft, non-tender  Pelvic: Cervical exam deferred         Fetal Status: Fetal Heart Rate (bpm): 143 Fundal Height: 31 cm Movement: Present    Fetal Surveillance Testing today: doppler   Chaperone: N/A    Results for orders placed or performed in visit on 05/03/22 (from the past 24 hour(s))  POC Urinalysis Dipstick OB   Collection Time: 05/03/22  3:01 PM  Result Value Ref Range   Color, UA     Clarity, UA     Glucose, UA Negative Negative   Bilirubin, UA     Ketones, UA neg    Spec Grav, UA     Blood, UA trace    pH, UA     POC,PROTEIN,UA Negative Negative, Trace, Small (1+), Moderate (2+), Large (3+), 4+   Urobilinogen, UA     Nitrite, UA neg    Leukocytes, UA  Negative Negative   Appearance     Odor      Assessment & Plan:  High-risk pregnancy: G2P0010 at [redacted]w[redacted]d with an Estimated Date of Delivery: 07/12/22   1) CHTN vs GHTN, 1 elevated bp in April prior to pregnancy, multiple borderline during pregnancy, 1 elevated @ 24wks and again now. On ASA. Will get labs today. Reviewed pre-e s/s, reasons to seek care  2) Morbid obesity, BMI 55   3) Reflux> improved w/ nexium, episodes suspicious for gallbladder. To let us know if happens again  Meds: No orders of the defined types were placed in this encounter.   Labs/procedures today:  pre-e labs , declined flu shot, wants tdap next visit  Treatment Plan:  [name] q4wks    2x/wk testing nst/sono @ 32wks     Deliver 37-39wks (36-37wks or prn if poor control)____   Reviewed: Preterm labor symptoms and general obstetric  precautions including but not limited to vaginal bleeding, contractions, leaking of fluid and fetal movement were reviewed in detail with the patient.  All questions were answered. Does have home bp cuff. Office bp cuff given: not applicable. Check bp weekly, let us know if consistently >150 and/or >95.  Follow-up: Return for As scheduled.   Future Appointments  Date Time Provider Department Center  05/17/2022 11:30 AM [facility] [name] [facility] None  05/17/2022  1:30 PM Cheral Marker, CNM [facility]  06/14/2022  9:15 AM [facility] [name] [facility] None  06/14/2022 10:10 AM Cheral Marker, CNM [facility]  06/21/2022  8:30 AM [facility] [name] [facility] None  06/21/2022  9:30 AM Jacklyn Shell, CNM [facility]  06/28/2022  8:30 AM [facility] [name] [facility] None  06/28/2022  9:30 AM Cheral Marker, CNM [facility]  07/05/2022  8:30 AM [facility] [name] [facility] None  07/05/2022  9:30 AM Cheral Marker, CNM [facility]    Orders Placed This Encounter  Procedures   US OB Follow Up   US FETAL BPP WO NON STRESS   [name] UA Cord Doppler   CBC   Comprehensive metabolic panel   Protein / creatinine ratio, urine   POC Urinalysis Dipstick OB   Cheral Marker CNM, [facility] 05/03/2022 3:10 PM

## 2022-05-03 NOTE — Patient Instructions (Signed)
Lucy, thank you for choosing our office today! We appreciate the opportunity to meet your healthcare needs. You may receive a short survey by mail, e-mail, or through MyChart. If you are happy with your care we would appreciate if you could take just a few minutes to complete the survey questions. We read all of your comments and take your feedback very seriously. Thank you again for choosing our office.  [facility]  [facility] ([address] [place], [place] [address]) Entrance C, located off of [address] Free 24/7 valet parking   CLASSES: Go to [email or web address] to register for classes (childbirth, breastfeeding, waterbirth, infant CPR, daddy bootcamp, etc.)  Call the office ([phone]) or go to [facility] if: You begin to have strong, frequent contractions Your water breaks.  Sometimes it is a big gush of fluid, sometimes it is just a trickle that keeps getting your panties wet or running down your legs You have vaginal bleeding.  It is normal to have a small amount of spotting if your cervix was checked.  You don't feel your baby moving like normal.  If you don't, get you something to eat and drink and lay down and focus on feeling your baby move.   If your baby is still not moving like normal, you should call the office or go to [facility].  Call the office ([phone]) or go to [facility] for these signs of pre-eclampsia: Severe headache that does not go away with Tylenol Visual changes- seeing spots, double, blurred vision Pain under your right breast or upper abdomen that does not go away with Tums or heartburn medicine Nausea and/or vomiting Severe swelling in your hands, feet, and face   Tdap Vaccine It is recommended that you get the Tdap vaccine during the third trimester of EACH pregnancy to help protect your baby from getting pertussis (whooping cough) 27-36 weeks is the BEST time to do  this so that you can pass the protection on to your baby. During pregnancy is better than after pregnancy, but if you are unable to get it during pregnancy it will be offered at the hospital.  You can get this vaccine with us, at the health department, your family doctor, or some local pharmacies Everyone who will be around your baby should also be up-to-date on their vaccines before the baby comes. Adults (who are not pregnant) only need 1 dose of Tdap during adulthood.   [place] Pediatricians/Family Doctors [facility] ([facility]): [address], [phone]           [facility]: [address], [phone]                 [facility] ([facility]): [address], [phone] (call to ask if accepting patients) [facility]: [address], [place], [phone]    [place] Pediatricians/Family Doctors [facility] ([facility]): [address], [phone] [facility]: [address], [phone] [facility]: [address], [phone]  [place] [facility]  [facility] ([facility]): [phone] [facility]: [address], [phone]   [place] Family [facility]: [address], [phone]  [place] [facility]  [facility]: [address], [phone]  Home Blood Pressure Monitoring for Patients   Your provider has recommended that you check your   blood pressure (BP) at least once a week at home. If you do not have a blood pressure cuff at home, one will be provided for you. Contact your provider if you have not received your monitor within 1 week.   Helpful Tips for Accurate Home Blood Pressure Checks  Don't smoke, exercise, or drink caffeine 30 minutes before checking your BP Use the restroom before checking your BP (a full bladder can raise your  pressure) Relax in a comfortable upright chair Feet on the ground Left arm resting comfortably on a flat surface at the level of your heart Legs uncrossed Back supported Sit quietly and don't talk Place the cuff on your bare arm Adjust snuggly, so that only two fingertips can fit between your skin and the top of the cuff Check 2 readings separated by at least one minute Keep a log of your BP readings For a visual, please reference this diagram: http://ccnc.care/bpdiagram  Provider Name: [facility]     Phone: [phone]  Zone 1: ALL CLEAR  Continue to monitor your symptoms:  BP reading is less than 140 (top number) or less than 90 (bottom number)  No right upper stomach pain No headaches or seeing spots No feeling nauseated or throwing up No swelling in face and hands  Zone 2: CAUTION Call your doctor's office for any of the following:  BP reading is greater than 140 (top number) or greater than 90 (bottom number)  Stomach pain under your ribs in the middle or right side Headaches or seeing spots Feeling nauseated or throwing up Swelling in face and hands  Zone 3: EMERGENCY  Seek immediate medical care if you have any of the following:  BP reading is greater than160 (top number) or greater than 110 (bottom number) Severe headaches not improving with Tylenol Serious difficulty catching your breath Any worsening symptoms from Zone 2   Third Trimester of Pregnancy The third trimester is from week 29 through week 42, months 7 through 9. The third trimester is a time when the fetus is growing rapidly. At the end of the ninth month, the fetus is about 20 inches in length and weighs 6-10 pounds.  BODY CHANGES Your body goes through many changes during pregnancy. The changes vary from woman to woman.  Your weight will continue to increase. You can expect to gain 25-35 pounds (11-16 kg) by the end of the pregnancy. You may begin to get stretch marks on your hips, abdomen,  and breasts. You may urinate more often because the fetus is moving lower into your pelvis and pressing on your bladder. You may develop or continue to have heartburn as a result of your pregnancy. You may develop constipation because certain hormones are causing the muscles that push waste through your intestines to slow down. You may develop hemorrhoids or swollen, bulging veins (varicose veins). You may have pelvic pain because of the weight gain and pregnancy hormones relaxing your joints between the bones in your pelvis. Backaches may result from overexertion of the muscles supporting your posture. You may have changes in your hair. These can include thickening of your hair, rapid growth, and changes in texture. Some women also have hair loss during or after pregnancy, or hair that feels dry or thin. Your hair will most likely return to normal after your baby is born. Your breasts will continue to grow and be tender. A yellow discharge may leak from your breasts called colostrum. Your belly button may stick out. You may   feel short of breath because of your expanding uterus. You may notice the fetus "dropping," or moving lower in your abdomen. You may have a bloody mucus discharge. This usually occurs a few days to a week before labor begins. Your cervix becomes thin and soft (effaced) near your due date. WHAT TO EXPECT AT YOUR PRENATAL EXAMS  You will have prenatal exams every 2 weeks until week 36. Then, you will have weekly prenatal exams. During a routine prenatal visit: You will be weighed to make sure you and the fetus are growing normally. Your blood pressure is taken. Your abdomen will be measured to track your baby's growth. The fetal heartbeat will be listened to. Any test results from the previous visit will be discussed. You may have a cervical check near your due date to see if you have effaced. At around 36 weeks, your caregiver will check your cervix. At the same time, your  caregiver will also perform a test on the secretions of the vaginal tissue. This test is to determine if a type of bacteria, Group B streptococcus, is present. Your caregiver will explain this further. Your caregiver may ask you: What your birth plan is. How you are feeling. If you are feeling the baby move. If you have had any abnormal symptoms, such as leaking fluid, bleeding, severe headaches, or abdominal cramping. If you have any questions. Other tests or screenings that may be performed during your third trimester include: Blood tests that check for low iron levels (anemia). Fetal testing to check the health, activity level, and growth of the fetus. Testing is done if you have certain medical conditions or if there are problems during the pregnancy. FALSE LABOR You may feel small, irregular contractions that eventually go away. These are called Braxton Hicks contractions, or false labor. Contractions may last for hours, days, or even weeks before true labor sets in. If contractions come at regular intervals, intensify, or become painful, it is best to be seen by your caregiver.  SIGNS OF LABOR  Menstrual-like cramps. Contractions that are 5 minutes apart or less. Contractions that start on the top of the uterus and spread down to the lower abdomen and back. A sense of increased pelvic pressure or back pain. A watery or bloody mucus discharge that comes from the vagina. If you have any of these signs before the 37th week of pregnancy, call your caregiver right away. You need to go to the hospital to get checked immediately. HOME CARE INSTRUCTIONS  Avoid all smoking, herbs, alcohol, and unprescribed drugs. These chemicals affect the formation and growth of the baby. Follow your caregiver's instructions regarding medicine use. There are medicines that are either safe or unsafe to take during pregnancy. Exercise only as directed by your caregiver. Experiencing uterine cramps is a good sign to  stop exercising. Continue to eat regular, healthy meals. Wear a good support bra for breast tenderness. Do not use hot tubs, steam rooms, or saunas. Wear your seat belt at all times when driving. Avoid raw meat, uncooked cheese, cat litter boxes, and soil used by cats. These carry germs that can cause birth defects in the baby. Take your prenatal vitamins. Try taking a stool softener (if your caregiver approves) if you develop constipation. Eat more high-fiber foods, such as fresh vegetables or fruit and whole grains. Drink plenty of fluids to keep your urine clear or pale yellow. Take warm sitz baths to soothe any pain or discomfort caused by hemorrhoids. Use hemorrhoid cream if   your caregiver approves. If you develop varicose veins, wear support hose. Elevate your feet for 15 minutes, 3-4 times a day. Limit salt in your diet. Avoid heavy lifting, wear low heal shoes, and practice good posture. Rest a lot with your legs elevated if you have leg cramps or low back pain. Visit your dentist if you have not gone during your pregnancy. Use a soft toothbrush to brush your teeth and be gentle when you floss. A sexual relationship may be continued unless your caregiver directs you otherwise. Do not travel far distances unless it is absolutely necessary and only with the approval of your caregiver. Take prenatal classes to understand, practice, and ask questions about the labor and delivery. Make a trial run to the hospital. Pack your hospital bag. Prepare the baby's nursery. Continue to go to all your prenatal visits as directed by your caregiver. SEEK MEDICAL CARE IF: You are unsure if you are in labor or if your water has broken. You have dizziness. You have mild pelvic cramps, pelvic pressure, or nagging pain in your abdominal area. You have persistent nausea, vomiting, or diarrhea. You have a bad smelling vaginal discharge. You have pain with urination. SEEK IMMEDIATE MEDICAL CARE IF:  You  have a fever. You are leaking fluid from your vagina. You have spotting or bleeding from your vagina. You have severe abdominal cramping or pain. You have rapid weight loss or gain. You have shortness of breath with chest pain. You notice sudden or extreme swelling of your face, hands, ankles, feet, or legs. You have not felt your baby move in over an hour. You have severe headaches that do not go away with medicine. You have vision changes. Document Released: 05/11/2001 Document Revised: 05/22/2013 Document Reviewed: 07/18/2012 ExitCare Patient Information 2015 [employer]. This information is not intended to replace advice given to you by your health care provider. Make sure you discuss any questions you have with your health care provider.       

## 2022-05-04 LAB — COMPREHENSIVE METABOLIC PANEL
ALT: 15 IU/L (ref 0–32)
AST: 10 IU/L (ref 0–40)
Albumin/Globulin Ratio: 1.3 (ref 1.2–2.2)
Albumin: 3.5 g/dL — ABNORMAL LOW (ref 4.0–5.0)
Alkaline Phosphatase: 96 IU/L (ref 44–121)
BUN/Creatinine Ratio: 12 (ref 9–23)
BUN: 7 mg/dL (ref 6–20)
Bilirubin Total: <0.2 mg/dL (ref 0.0–1.2)
CO2: 20 mmol/L (ref 20–29)
Calcium: 8.8 mg/dL (ref 8.7–10.2)
Chloride: 104 mmol/L (ref 96–106)
Creatinine, Ser: 0.57 mg/dL (ref 0.57–1.00)
Globulin, Total: 2.6 g/dL (ref 1.5–4.5)
Glucose: 73 mg/dL (ref 70–99)
Potassium: 4.4 mmol/L (ref 3.5–5.2)
Sodium: 140 mmol/L (ref 134–144)
Total Protein: 6.1 g/dL (ref 6.0–8.5)
eGFR: 130 mL/min/{1.73_m2} (ref >59)

## 2022-05-04 LAB — CBC
Hematocrit: 30.6 % — ABNORMAL LOW (ref 34.0–46.6)
Hemoglobin: 10.3 g/dL — ABNORMAL LOW (ref 11.1–15.9)
MCH: 30.2 pg (ref 26.6–33.0)
MCHC: 33.7 g/dL (ref 31.5–35.7)
MCV: 90 fL (ref 79–97)
Platelets: 313 10*3/uL (ref 150–450)
RBC: 3.41 x10E6/uL — ABNORMAL LOW (ref 3.77–5.28)
RDW: 13.4 % (ref 11.7–15.4)
WBC: 11.9 10*3/uL — ABNORMAL HIGH (ref 3.4–10.8)

## 2022-05-04 LAB — PROTEIN / CREATININE RATIO, URINE
Creatinine, Urine: 128.2 mg/dL
Protein, Ur: 13.6 mg/dL
Protein/Creat Ratio: 106 mg/g creat (ref 0–200)

## 2022-05-17 ENCOUNTER — Ambulatory Visit (INDEPENDENT_AMBULATORY_CARE_PROVIDER_SITE_OTHER): Payer: Medicaid Other

## 2022-05-17 ENCOUNTER — Ambulatory Visit (INDEPENDENT_AMBULATORY_CARE_PROVIDER_SITE_OTHER): Payer: Medicaid Other | Admitting: Women's Health

## 2022-05-17 ENCOUNTER — Encounter: Payer: Self-pay | Admitting: Women's Health

## 2022-05-17 VITALS — BP 136/76 | HR 84 | Wt 326.5 lb

## 2022-05-17 DIAGNOSIS — Z23 Encounter for immunization: Secondary | ICD-10-CM | POA: Diagnosis not present

## 2022-05-17 DIAGNOSIS — O10919 Unspecified pre-existing hypertension complicating pregnancy, unspecified trimester: Secondary | ICD-10-CM | POA: Diagnosis not present

## 2022-05-17 DIAGNOSIS — Z3A32 32 weeks gestation of pregnancy: Secondary | ICD-10-CM | POA: Diagnosis not present

## 2022-05-17 DIAGNOSIS — O0993 Supervision of high risk pregnancy, unspecified, third trimester: Secondary | ICD-10-CM

## 2022-05-17 LAB — POCT URINALYSIS DIPSTICK OB
Blood, UA: NEGATIVE
Glucose, UA: NEGATIVE
Ketones, UA: NEGATIVE
Nitrite, UA: NEGATIVE

## 2022-05-17 NOTE — Progress Notes (Signed)
[place] PREGNANCY VISIT Patient name: Stefanie Farmer MRN [id number]  Date of birth: 10-Jul-1995 Chief Complaint:   Routine Prenatal Visit  History of Present Illness:   Stefanie Farmer is a 26 y.o. G38P0010 female at [redacted]w[redacted]d with an Estimated Date of Delivery: 07/12/22 being seen today for ongoing management of a high-risk pregnancy complicated by chronic hypertension currently on no meds.    Today she reports  some mild headaches, home bp's 150s/70-80s. Did have one SBP of 170, rechecked soon after and was normal. Not sure if cuff is correct . Denies current ha, visual changes, ruq/epigastric pain, n/v.   Contractions: Not present. Vag. Bleeding: None.  Movement: Present. denies leaking of fluid.      01/01/2022   10:49 AM 09/15/2021   11:14 AM  Depression screen PHQ 2/9  Decreased Interest 0 0  Down, Depressed, Hopeless 0 0  PHQ - 2 Score 0 0  Altered sleeping 2 1  Tired, decreased energy 2 0  Change in appetite 2 1  Feeling bad or failure about yourself  0 0  Trouble concentrating 0 0  Moving slowly or fidgety/restless 0 0  Suicidal thoughts 0 0  PHQ-9 Score 6 2        01/01/2022   10:49 AM 09/15/2021   11:14 AM  GAD 7 : Generalized Anxiety Score  Nervous, Anxious, on Edge 0 1  Control/stop worrying 0 0  Worry too much - different things 0 0  Trouble relaxing 0 0  Restless 0 0  Easily annoyed or irritable 0 0  Afraid - awful might happen 0 0  Total GAD 7 Score 0 1     Review of Systems:   Pertinent items are noted in HPI Denies abnormal vaginal discharge w/ itching/odor/irritation, headaches, visual changes, shortness of breath, chest pain, abdominal pain, severe nausea/vomiting, or problems with urination or bowel movements unless otherwise stated above. Pertinent History Reviewed:  Reviewed past medical,surgical, social, obstetrical and family history.  Reviewed problem list, medications and allergies. Physical Assessment:   Vitals:   05/17/22 1221  BP: 136/76   Pulse: 84  Weight: (!) 326 lb 8 oz (148.1 kg)  Body mass index is 56.04 kg/m.      Repeat after visit  Her cuff: 155/90, our cuff 131/77- her cuff is too small (can purchase a bigger cuff to fit)       Physical Examination:   General appearance: alert, well appearing, and in no distress  Mental status: alert, oriented to person, place, and time  Skin: warm & dry   Extremities: Edema: None    Cardiovascular: normal heart rate noted  Respiratory: normal respiratory effort, no distress  Abdomen: gravid, soft, non-tender  Pelvic: Cervical exam deferred         Fetal Status:     Movement: Present    Fetal Surveillance Testing today: [name] 32 wks,cephalic,BPP [id number] placenta gr 0,FHR 150 bpm,RI .55,.63,.70,.68=75%,EFW 1779 g 25%   Chaperone: N/A    Results for orders placed or performed in visit on 05/17/22 (from the past 24 hour(s))  POC Urinalysis Dipstick OB   Collection Time: 05/17/22 12:25 PM  Result Value Ref Range   Color, UA     Clarity, UA     Glucose, UA Negative Negative   Bilirubin, UA     Ketones, UA negative    Spec Grav, UA     Blood, UA negative    pH, UA     POC,PROTEIN,UA Small (1+) Negative, Trace, Small (  1+), Moderate (2+), Large (3+), 4+   Urobilinogen, UA     Nitrite, UA negative    Leukocytes, UA Trace (A) Negative   Appearance     Odor      Assessment & Plan:  High-risk pregnancy: G2P0010 at [redacted]w[redacted]d with an Estimated Date of Delivery: 07/12/22   1) CHTN (vs GHTN), leaning more towards [facility] as 1 elevated in April prior to pregnancy, then multiple borderline prior to 20wks. 1 elevated at 24wks and again last visit. Home cuff does not correlate w/ ours- can purchase larger cuff. Asymptomatic, 1+ proteinuria today. Normal labs last visit. Reviewed pre-e s/s, bp's and reasons to seek care.   Meds: No orders of the defined types were placed in this encounter.  Labs/procedures today: U/S  Treatment Plan:  [name] q4wks    2x/wk testing nst/sono @ 32wks      Deliver 37-39wks (36-37wks or prn if poor control)____   Reviewed: Preterm labor symptoms and general obstetric precautions including but not limited to vaginal bleeding, contractions, leaking of fluid and fetal movement were reviewed in detail with the patient.  All questions were answered.   Follow-up: Return for thurs nurse/nst, mon bpp/dopp hrob md/cnm til 39wks.   Future Appointments  Date Time Provider Department Center  05/17/2022  1:30 PM Cheral Marker, CNM [facility]  05/20/2022  8:30 AM [facility] NURSE [facility]  06/14/2022  9:15 AM [facility] [name] [facility] None  06/14/2022 10:10 AM Cheral Marker, CNM [facility]  06/21/2022  8:30 AM [facility] [name] [facility] None  06/21/2022  9:30 AM Jacklyn Shell, CNM [facility]  06/28/2022  8:30 AM [facility] [name] [facility] None  06/28/2022  9:30 AM Cheral Marker, CNM [facility]  07/05/2022  8:30 AM [facility] [name] [facility] None  07/05/2022  9:30 AM Cheral Marker, CNM [facility]    Orders Placed This Encounter  Procedures   Tdap vaccine greater than or equal to 7yo IM   POC Urinalysis Dipstick OB   Cheral Marker CNM, [facility] 05/17/2022 12:58 PM

## 2022-05-17 NOTE — Patient Instructions (Signed)
Kizzi, thank you for choosing our office today! We appreciate the opportunity to meet your healthcare needs. You may receive a short survey by mail, e-mail, or through MyChart. If you are happy with your care we would appreciate if you could take just a few minutes to complete the survey questions. We read all of your comments and take your feedback very seriously. Thank you again for choosing our office.  [facility]  [facility] ([address] [place], [place] [address]) Entrance C, located off of [address] Free 24/7 valet parking   CLASSES: Go to [email or web address] to register for classes (childbirth, breastfeeding, waterbirth, infant CPR, daddy bootcamp, etc.)  Call the office ([phone]) or go to [facility] if: You begin to have strong, frequent contractions Your water breaks.  Sometimes it is a big gush of fluid, sometimes it is just a trickle that keeps getting your panties wet or running down your legs You have vaginal bleeding.  It is normal to have a small amount of spotting if your cervix was checked.  You don't feel your baby moving like normal.  If you don't, get you something to eat and drink and lay down and focus on feeling your baby move.   If your baby is still not moving like normal, you should call the office or go to [facility].  Call the office ([phone]) or go to [facility] for these signs of pre-eclampsia: Severe headache that does not go away with Tylenol Visual changes- seeing spots, double, blurred vision Pain under your right breast or upper abdomen that does not go away with Tums or heartburn medicine Nausea and/or vomiting Severe swelling in your hands, feet, and face   Tdap Vaccine It is recommended that you get the Tdap vaccine during the third trimester of EACH pregnancy to help protect your baby from getting pertussis (whooping cough) 27-36 weeks is the BEST time to do  this so that you can pass the protection on to your baby. During pregnancy is better than after pregnancy, but if you are unable to get it during pregnancy it will be offered at the hospital.  You can get this vaccine with us, at the health department, your family doctor, or some local pharmacies Everyone who will be around your baby should also be up-to-date on their vaccines before the baby comes. Adults (who are not pregnant) only need 1 dose of Tdap during adulthood.   [place] Pediatricians/Family Doctors [facility] ([facility]): [address], [phone]           [facility]: [address], [phone]                [facility] ([facility]): [address], [phone] (call to ask if accepting patients) [facility]: [address], [place], [phone]    [place] Pediatricians/Family Doctors [facility] ([facility]): [address], [phone] [facility]: [address], [phone] [facility]: [address], [phone]  [place] [facility]  [facility] ([facility]): [phone] [facility]: [address], [phone]   [place] [facility]: [address] [place] [address], [phone]  [place] [facility]  [facility]: [address], [phone]  Home Blood Pressure Monitoring for Patients   Your provider has recommended that you check your   blood pressure (BP) at least once a week at home. If you do not have a blood pressure cuff at home, one will be provided for you. Contact your provider if you have not received your monitor within 1 week.   Helpful Tips for Accurate Home Blood Pressure Checks  Don't smoke, exercise, or drink caffeine 30 minutes before checking your BP Use the restroom before checking your BP (a full bladder can raise your  pressure) Relax in a comfortable upright chair Feet on the ground Left arm resting comfortably on a flat surface at the level of your heart Legs uncrossed Back supported Sit quietly and don't talk Place the cuff on your bare arm Adjust snuggly, so that only two fingertips can fit between your skin and the top of the cuff Check 2 readings separated by at least one minute Keep a log of your BP readings For a visual, please reference this diagram: http://ccnc.care/bpdiagram  Provider Name: [facility]     Phone: [phone]  Zone 1: ALL CLEAR  Continue to monitor your symptoms:  BP reading is less than 140 (top number) or less than 90 (bottom number)  No right upper stomach pain No headaches or seeing spots No feeling nauseated or throwing up No swelling in face and hands  Zone 2: CAUTION Call your doctor's office for any of the following:  BP reading is greater than 140 (top number) or greater than 90 (bottom number)  Stomach pain under your ribs in the middle or right side Headaches or seeing spots Feeling nauseated or throwing up Swelling in face and hands  Zone 3: EMERGENCY  Seek immediate medical care if you have any of the following:  BP reading is greater than160 (top number) or greater than 110 (bottom number) Severe headaches not improving with Tylenol Serious difficulty catching your breath Any worsening symptoms from Zone 2  Preterm Labor and Birth Information  The normal length of a pregnancy is 39-41 weeks. Preterm labor is when labor starts before 37 completed weeks of pregnancy. What are the risk factors for preterm labor? Preterm labor is more likely to occur in women who: Have certain infections during pregnancy such as a bladder infection, sexually transmitted infection, or infection inside the uterus (chorioamnionitis). Have a shorter-than-normal cervix. Have gone into preterm labor before. Have had surgery on their cervix. Are younger than age 17  or older than age 35. Are African American. Are pregnant with twins or multiple babies (multiple gestation). Take street drugs or smoke while pregnant. Do not gain enough weight while pregnant. Became pregnant shortly after having been pregnant. What are the symptoms of preterm labor? Symptoms of preterm labor include: Cramps similar to those that can happen during a menstrual period. The cramps may happen with diarrhea. Pain in the abdomen or lower back. Regular uterine contractions that may feel like tightening of the abdomen. A feeling of increased pressure in the pelvis. Increased watery or bloody mucus discharge from the vagina. Water breaking (ruptured amniotic sac). Why is it important to recognize signs of preterm labor? It is important to recognize signs of preterm labor because babies who are born prematurely may not be fully developed. This can put them at an increased risk for: Long-term (chronic) heart and lung problems. Difficulty immediately after birth with regulating body systems, including blood sugar, body temperature, heart rate, and breathing rate. Bleeding in the brain. Cerebral palsy. Learning difficulties. Death. These risks are highest for babies who are born before 34 weeks   of pregnancy. How is preterm labor treated? Treatment depends on the length of your pregnancy, your condition, and the health of your baby. It may involve: Having a stitch (suture) placed in your cervix to prevent your cervix from opening too early (cerclage). Taking or being given medicines, such as: Hormone medicines. These may be given early in pregnancy to help support the pregnancy. Medicine to stop contractions. Medicines to help mature the baby's lungs. These may be prescribed if the risk of delivery is high. Medicines to prevent your baby from developing cerebral palsy. If the labor happens before 34 weeks of pregnancy, you may need to stay in the hospital. What should I do if I  think I am in preterm labor? If you think that you are going into preterm labor, call your health care provider right away. How can I prevent preterm labor in future pregnancies? To increase your chance of having a full-term pregnancy: Do not use any tobacco products, such as cigarettes, chewing tobacco, and e-cigarettes. If you need help quitting, ask your health care provider. Do not use street drugs or medicines that have not been prescribed to you during your pregnancy. Talk with your health care provider before taking any herbal supplements, even if you have been taking them regularly. Make sure you gain a healthy amount of weight during your pregnancy. Watch for infection. If you think that you might have an infection, get it checked right away. Make sure to tell your health care provider if you have gone into preterm labor before. This information is not intended to replace advice given to you by your health care provider. Make sure you discuss any questions you have with your health care provider. Document Revised: 09/08/2018 Document Reviewed: 10/08/2015 Elsevier Patient Education  2020 Elsevier Inc.   

## 2022-05-17 NOTE — Progress Notes (Addendum)
[name] 32 wks,cephalic,BPP 8/8,anterior placenta gr 0,FHR 150 bpm,RI .55,.63,.70,.68=75%,EFW 1779 g 25%,AFI 19.8 cm

## 2022-05-18 ENCOUNTER — Other Ambulatory Visit: Payer: Self-pay | Admitting: Women's Health

## 2022-05-18 DIAGNOSIS — O0993 Supervision of high risk pregnancy, unspecified, third trimester: Secondary | ICD-10-CM

## 2022-05-18 DIAGNOSIS — O10919 Unspecified pre-existing hypertension complicating pregnancy, unspecified trimester: Secondary | ICD-10-CM

## 2022-05-20 ENCOUNTER — Ambulatory Visit (INDEPENDENT_AMBULATORY_CARE_PROVIDER_SITE_OTHER): Payer: Medicaid Other | Admitting: *Deleted

## 2022-05-20 VITALS — BP 132/83 | HR 106

## 2022-05-20 DIAGNOSIS — O10919 Unspecified pre-existing hypertension complicating pregnancy, unspecified trimester: Secondary | ICD-10-CM | POA: Diagnosis not present

## 2022-05-20 DIAGNOSIS — Z3A32 32 weeks gestation of pregnancy: Secondary | ICD-10-CM | POA: Diagnosis not present

## 2022-05-20 DIAGNOSIS — O0993 Supervision of high risk pregnancy, unspecified, third trimester: Secondary | ICD-10-CM

## 2022-05-20 NOTE — Progress Notes (Addendum)
   NURSE VISIT- NST  SUBJECTIVE:  Stefanie Farmer is a 26 y.o. G2P0010 female at [redacted]w[redacted]d, here for a NST for pregnancy complicated by [facility].  She reports active fetal movement, contractions: none, vaginal bleeding: none, membranes: intact.   OBJECTIVE:  BP 132/83   Pulse (!) 106   LMP 10/05/2021   Appears well, no apparent distress  No results found for this or any previous visit (from the past 24 hour(s)).  NST: FHR baseline 145 bpm, Variability: moderate, Accelerations:present, Decelerations:  Absent= Cat 1/reactive Toco: none   ASSESSMENT: G2P0010 at [redacted]w[redacted]d with CHTN NST reactive  PLAN: EFM strip reviewed by Cathie Beams, CNM   Recommendations: keep next appointment as scheduled    Jobe Marker  05/20/2022 9:26 AM  Chart reviewed for nurse visit. Agree with plan of care.  Jacklyn Shell, CNM 05/20/2022 10:34 AM

## 2022-05-25 ENCOUNTER — Encounter (HOSPITAL_COMMUNITY): Payer: Self-pay | Admitting: Obstetrics & Gynecology

## 2022-05-25 ENCOUNTER — Inpatient Hospital Stay (HOSPITAL_COMMUNITY): Payer: Medicaid Other

## 2022-05-25 ENCOUNTER — Inpatient Hospital Stay (HOSPITAL_COMMUNITY)
Admission: AD | Admit: 2022-05-25 | Discharge: 2022-05-25 | Disposition: A | Discharge status: Home / Self Care | Payer: Medicaid Other | Attending: Obstetrics & Gynecology | Admitting: Obstetrics & Gynecology

## 2022-05-25 DIAGNOSIS — O212 Late vomiting of pregnancy: Secondary | ICD-10-CM | POA: Insufficient documentation

## 2022-05-25 DIAGNOSIS — O368130 Decreased fetal movements, third trimester, not applicable or unspecified: Secondary | ICD-10-CM | POA: Diagnosis present

## 2022-05-25 DIAGNOSIS — O99613 Diseases of the digestive system complicating pregnancy, third trimester: Secondary | ICD-10-CM | POA: Diagnosis not present

## 2022-05-25 DIAGNOSIS — Z3689 Encounter for other specified antenatal screening: Secondary | ICD-10-CM

## 2022-05-25 DIAGNOSIS — K219 Gastro-esophageal reflux disease without esophagitis: Secondary | ICD-10-CM

## 2022-05-25 DIAGNOSIS — O26893 Other specified pregnancy related conditions, third trimester: Secondary | ICD-10-CM | POA: Diagnosis not present

## 2022-05-25 DIAGNOSIS — Z3A33 33 weeks gestation of pregnancy: Secondary | ICD-10-CM | POA: Diagnosis not present

## 2022-05-25 HISTORY — DX: Essential (primary) hypertension: I10

## 2022-05-25 LAB — COMPREHENSIVE METABOLIC PANEL
ALT: 19 U/L (ref 0–44)
AST: 15 U/L (ref 15–41)
Albumin: 2.6 g/dL — ABNORMAL LOW (ref 3.5–5.0)
Alkaline Phosphatase: 81 U/L (ref 38–126)
Anion gap: 8 (ref 5–15)
BUN: <5 mg/dL — ABNORMAL LOW (ref 6–20)
CO2: 24 mmol/L (ref 22–32)
Calcium: 8.9 mg/dL (ref 8.9–10.3)
Chloride: 106 mmol/L (ref 98–111)
Creatinine, Ser: 0.6 mg/dL (ref 0.44–1.00)
GFR, Estimated: >60 mL/min (ref >60)
Glucose, Bld: 93 mg/dL (ref 70–99)
Potassium: 4.2 mmol/L (ref 3.5–5.1)
Sodium: 138 mmol/L (ref 135–145)
Total Bilirubin: 0.2 mg/dL — ABNORMAL LOW (ref 0.3–1.2)
Total Protein: 5.7 g/dL — ABNORMAL LOW (ref 6.5–8.1)

## 2022-05-25 LAB — URINALYSIS, ROUTINE W REFLEX MICROSCOPIC
Bilirubin Urine: NEGATIVE
Glucose, UA: NEGATIVE mg/dL
Hgb urine dipstick: NEGATIVE
Ketones, ur: NEGATIVE mg/dL
Leukocytes,Ua: NEGATIVE
Nitrite: NEGATIVE
Protein, ur: NEGATIVE mg/dL
Specific Gravity, Urine: 1.005 (ref 1.005–1.030)
pH: 7 (ref 5.0–8.0)

## 2022-05-25 LAB — PROTEIN / CREATININE RATIO, URINE
Creatinine, Urine: 39 mg/dL
Total Protein, Urine: <6 mg/dL

## 2022-05-25 LAB — CBC
HCT: 30.6 % — ABNORMAL LOW (ref 36.0–46.0)
Hemoglobin: 10.2 g/dL — ABNORMAL LOW (ref 12.0–15.0)
MCH: 30.4 pg (ref 26.0–34.0)
MCHC: 33.3 g/dL (ref 30.0–36.0)
MCV: 91.3 fL (ref 80.0–100.0)
Platelets: 280 10*3/uL (ref 150–400)
RBC: 3.35 MIL/uL — ABNORMAL LOW (ref 3.87–5.11)
RDW: 14 % (ref 11.5–15.5)
WBC: 11.6 10*3/uL — ABNORMAL HIGH (ref 4.0–10.5)
nRBC: 0 % (ref 0.0–0.2)

## 2022-05-25 LAB — LIPASE, BLOOD: Lipase: 32 U/L (ref 11–51)

## 2022-05-25 NOTE — MAU Provider Note (Signed)
History     CSN: [id number]  Arrival date and time: 05/25/22 1953   Event Date/Time   First Provider Initiated Contact with Patient 05/25/22 2117      Chief Complaint  Patient presents with   Abdominal Pain   Decreased Fetal Movement   26 y.o. G2P0010 _0 .1 wks presenting with epigastric and back pain (near shoulder blades). Reports onset she she was eating ice cream around 6pm. Pain has since subsided. She took Nexium at that time. Reports similar episodes about 5-6 other times. Episodes range from 1-5 hours. Associated sx are vomiting 2-3 times with each episode. Has temporary relief.     OB History     Gravida  2   Para      Term      Preterm      AB  1   Living  0      SAB  1   IAB      Ectopic      Multiple      Live Births  0           Past Medical History:  Diagnosis Date   Hypertension    Kidney stone     Past Surgical History:  Procedure Laterality Date   CYSTOSCOPY W/ URETERAL STENT PLACEMENT Right 07/23/2021   Procedure: CYSTOSCOPY WITH RETROGRADE PYELOGRAM/URETERAL STENT PLACEMENT;  Surgeon: Cleon Gustin, MD;  Location: [facility];  Service: Urology;  Laterality: Right;   CYSTOSCOPY W/ URETERAL STENT PLACEMENT Right 08/13/2021   Procedure: CYSTOSCOPY WITH RETROGRADE PYELOGRAM/URETERAL STENT EXCHANGE;  Surgeon: Cleon Gustin, MD;  Location: [facility];  Service: Urology;  Laterality: Right;   EYE MUSCLE SURGERY     FOOT SURGERY     HOLMIUM LASER APPLICATION Right 4/49/6759   Procedure: HOLMIUM LASER APPLICATION;  Surgeon: Cleon Gustin, MD;  Location: [facility];  Service: Urology;  Laterality: Right;   ORIF ANKLE FRACTURE Right 06/24/2020   Procedure: OPEN REDUCTION INTERNAL FIXATION (ORIF) ANKLE FRACTURE;  Surgeon: Carole Civil, MD;  Location: [facility];  Service: Orthopedics;  Laterality: Right;    Family History  Problem Relation Age of Onset   Diabetes Paternal Grandmother    Diabetes Maternal Grandmother    Crohn's  disease Maternal Grandmother    Diabetes Maternal Grandfather    COPD Maternal Grandfather    Hypertension Father    Osteoporosis Mother    Fibromyalgia Mother     Social History   Tobacco Use   Smoking status: Former    Types: Cigarettes   Smokeless tobacco: Never  Vaping Use   Vaping Use: Every day  Substance Use Topics   Alcohol use: Never   Drug use: Not Currently    Frequency: 7.0 times per week    Types: Marijuana    Allergies: No Known Allergies  Medications Prior to Admission  Medication Sig Dispense Refill Last Dose   aspirin 81 MG chewable tablet Chew 2 tablets (162 mg total) by mouth daily. 60 tablet 7 05/24/2022   esomeprazole (NEXIUM 24HR) 20 MG capsule    05/25/2022   Ferric Maltol (ACCRUFER) 30 MG CAPS Take 1 tablet by mouth 2 (two) times daily. On an empty stomach 60 capsule 11 05/25/2022   Prenatal Vit-Fe Fumarate-FA (PRENATAL VITAMINS) 28-0.8 MG TABS Take by mouth.   05/24/2022   Blood Pressure Monitoring (BLOOD PRESSURE CUFF) MISC 1 kit by Does not apply route as directed. 1 each 0     Review of Systems Physical Exam  Blood pressure (!) 157/70, pulse 87, temperature 97.8 F (36.6 C), resp. rate 18, height _0  (1.6 m), weight (!) 150.1 kg, last menstrual period 10/05/2021, SpO2 96 %.  Physical Exam  MAU Course  Procedures  MDM ***  Assessment and Plan  ***  Julianne Handler 05/25/2022, 9:19 PM

## 2022-05-25 NOTE — MAU Note (Addendum)
.  Stefanie Farmer is a 26 y.o. at [redacted]w[redacted]d here in MAU reporting decreased FM since yesterday. Has felt some movement but not like usual. Also some mid back pain that came around to upper abd and then down over entire abdomen. Has had some mid back and upper abd pain before and told MD who ? Gallbladder. Pt states this was different in that it went to lower abd. On way to hosp pain stopped and has not had any further pain. Denies VB or LOF.  Onset of complaint: yesterday Pain score: 0 Vitals:   05/25/22 2017 05/25/22 2022  BP:  136/78  Pulse: 95   Resp: 18   Temp: 97.8 F (36.6 C)   SpO2: 99%      FHT:133 Lab orders placed from triage:  u/a

## 2022-05-26 ENCOUNTER — Encounter: Payer: Self-pay | Admitting: *Deleted

## 2022-05-26 ENCOUNTER — Ambulatory Visit: Payer: Medicaid Other | Admitting: *Deleted

## 2022-05-26 ENCOUNTER — Ambulatory Visit: Payer: Medicaid Other | Attending: Obstetrics and Gynecology

## 2022-05-26 ENCOUNTER — Other Ambulatory Visit: Payer: Self-pay | Admitting: Women's Health

## 2022-05-26 VITALS — BP 133/75 | HR 86

## 2022-05-26 DIAGNOSIS — Z3A33 33 weeks gestation of pregnancy: Secondary | ICD-10-CM

## 2022-05-26 DIAGNOSIS — O10013 Pre-existing essential hypertension complicating pregnancy, third trimester: Secondary | ICD-10-CM | POA: Diagnosis not present

## 2022-05-26 DIAGNOSIS — E669 Obesity, unspecified: Secondary | ICD-10-CM

## 2022-05-26 DIAGNOSIS — O403XX0 Polyhydramnios, third trimester, not applicable or unspecified: Secondary | ICD-10-CM

## 2022-05-26 DIAGNOSIS — O0993 Supervision of high risk pregnancy, unspecified, third trimester: Secondary | ICD-10-CM | POA: Diagnosis present

## 2022-05-26 DIAGNOSIS — O133 Gestational [pregnancy-induced] hypertension without significant proteinuria, third trimester: Secondary | ICD-10-CM | POA: Insufficient documentation

## 2022-05-26 DIAGNOSIS — O99213 Obesity complicating pregnancy, third trimester: Secondary | ICD-10-CM | POA: Diagnosis not present

## 2022-05-26 NOTE — MAU Provider Note (Incomplete)
History     CSN: [id number]  Arrival date and time: 05/25/22 1953   Event Date/Time   First Provider Initiated Contact with Patient 05/25/22 2117      Chief Complaint  Patient presents with  . Abdominal Pain  . Decreased Fetal Movement   26 y.o. G2P0010 _0 .1 wks presenting with epigastric and back pain (near shoulder blades). Reports onset she she was eating ice cream around 6pm. Pain has since subsided. She took Nexium at that time. Reports similar episodes about 5-6 other times. Episodes range from 1-5 hours. Associated sx are vomiting 2-3 times with each episode. Has temporary relief.     OB History     Gravida  2   Para      Term      Preterm      AB  1   Living  0      SAB  1   IAB      Ectopic      Multiple      Live Births  0           Past Medical History:  Diagnosis Date  . Hypertension   . Kidney stone     Past Surgical History:  Procedure Laterality Date  . CYSTOSCOPY W/ URETERAL STENT PLACEMENT Right 07/23/2021   Procedure: CYSTOSCOPY WITH RETROGRADE PYELOGRAM/URETERAL STENT PLACEMENT;  Surgeon: Cleon Gustin, MD;  Location: [facility];  Service: Urology;  Laterality: Right;  . CYSTOSCOPY W/ URETERAL STENT PLACEMENT Right 08/13/2021   Procedure: CYSTOSCOPY WITH RETROGRADE PYELOGRAM/URETERAL STENT EXCHANGE;  Surgeon: Cleon Gustin, MD;  Location: [facility];  Service: Urology;  Laterality: Right;  . EYE MUSCLE SURGERY    . FOOT SURGERY    . HOLMIUM LASER APPLICATION Right 0/01/3817   Procedure: HOLMIUM LASER APPLICATION;  Surgeon: Cleon Gustin, MD;  Location: [facility];  Service: Urology;  Laterality: Right;  . ORIF ANKLE FRACTURE Right 06/24/2020   Procedure: OPEN REDUCTION INTERNAL FIXATION (ORIF) ANKLE FRACTURE;  Surgeon: Carole Civil, MD;  Location: [facility];  Service: Orthopedics;  Laterality: Right;    Family History  Problem Relation Age of Onset  . Diabetes Paternal Grandmother   . Diabetes Maternal Grandmother    . Crohn's disease Maternal Grandmother   . Diabetes Maternal Grandfather   . COPD Maternal Grandfather   . Hypertension Father   . Osteoporosis Mother   . Fibromyalgia Mother     Social History   Tobacco Use  . Smoking status: Former    Types: Cigarettes  . Smokeless tobacco: Never  Vaping Use  . Vaping Use: Every day  Substance Use Topics  . Alcohol use: Never  . Drug use: Not Currently    Frequency: 7.0 times per week    Types: Marijuana    Allergies: No Known Allergies  Medications Prior to Admission  Medication Sig Dispense Refill Last Dose  . aspirin 81 MG chewable tablet Chew 2 tablets (162 mg total) by mouth daily. 60 tablet 7 05/24/2022  . esomeprazole (NEXIUM 24HR) 20 MG capsule    05/25/2022  . Ferric Maltol (ACCRUFER) 30 MG CAPS Take 1 tablet by mouth 2 (two) times daily. On an empty stomach 60 capsule 11 05/25/2022  . Prenatal Vit-Fe Fumarate-FA (PRENATAL VITAMINS) 28-0.8 MG TABS Take by mouth.   05/24/2022  . Blood Pressure Monitoring (BLOOD PRESSURE CUFF) MISC 1 kit by Does not apply route as directed. 1 each 0     Review of Systems  Constitutional:  Negative for fever.  Gastrointestinal:  Positive for abdominal pain and vomiting.  Genitourinary:  Negative for vaginal bleeding.   Physical Exam   Blood pressure (!) 157/70, pulse 87, temperature 97.8 F (36.6 C), resp. rate 18, height 5' 3" (1.6 m), weight (!) 150.1 kg, last menstrual period 10/05/2021, SpO2 96 %. Patient Vitals for the past 24 hrs:  BP Temp Pulse Resp SpO2 Height Weight  05/25/22 2230 (!) 128/57 -- 88 -- 96 % -- --  05/25/22 2225 -- -- -- -- 96 % -- --  05/25/22 2220 -- -- -- -- 96 % -- --  05/25/22 2215 138/61 -- 85 -- 96 % -- --  05/25/22 2150 -- -- -- -- 96 % -- --  05/25/22 2147 (!) 162/75 -- 85 -- 97 % -- --  05/25/22 2140 -- -- -- -- 97 % -- --  05/25/22 2135 -- -- -- -- 97 % -- --  05/25/22 2130 (!) 152/76 -- 88 -- 96 % -- --  05/25/22 2125 -- -- -- -- 96 % -- --  05/25/22  2120 -- -- -- -- 96 % -- --  05/25/22 2115 (!) 157/70 -- 87 -- 96 % -- --  05/25/22 2110 -- -- -- -- 96 % -- --  05/25/22 2105 -- -- -- -- 95 % -- --  05/25/22 2100 (!) 150/74 -- 84 -- 96 % -- --  05/25/22 2055 -- -- -- -- 96 % -- --  05/25/22 2050 (!) 154/72 -- 89 -- 96 % -- --  05/25/22 2022 136/78 -- -- -- -- -- --  05/25/22 2017 -- 97.8 F (36.6 C) 95 18 99 % 5' 3" (1.6 m) (!) 150.1 kg    Physical Exam Vitals and nursing note reviewed.  Constitutional:      General: She is not in acute distress.    Appearance: Normal appearance.  HENT:     Head: Normocephalic and atraumatic.  Cardiovascular:     Rate and Rhythm: Normal rate.  Pulmonary:     Effort: Pulmonary effort is normal. No respiratory distress.  Abdominal:     Palpations: Abdomen is soft.     Tenderness: There is no abdominal tenderness.     Comments: gravid  Musculoskeletal:        General: Normal range of motion.     Cervical back: Normal range of motion.  Skin:    General: Skin is warm and dry.  Neurological:     General: No focal deficit present.     Mental Status: She is alert and oriented to person, place, and time.  Psychiatric:        Mood and Affect: Mood normal.        Behavior: Behavior normal.   EFM: 140 bpm, mod variability, + accels, no decels Toco: none  Results for orders placed or performed during the hospital encounter of 05/25/22 (from the past 24 hour(s))  Urinalysis, Routine w reflex microscopic Urine, Clean Catch     Status: Abnormal   Collection Time: 05/25/22  8:46 PM  Result Value Ref Range   Color, Urine STRAW (A) YELLOW   APPearance CLEAR CLEAR   Specific Gravity, Urine 1.005 1.005 - 1.030   pH 7.0 5.0 - 8.0   Glucose, UA NEGATIVE NEGATIVE mg/dL   Hgb urine dipstick NEGATIVE NEGATIVE   Bilirubin Urine NEGATIVE NEGATIVE   Ketones, ur NEGATIVE NEGATIVE mg/dL   Protein, ur NEGATIVE NEGATIVE mg/dL   Nitrite NEGATIVE NEGATIVE   Leukocytes,Ua NEGATIVE  NEGATIVE  Protein /  creatinine ratio, urine     Status: None   Collection Time: 05/25/22  8:46 PM  Result Value Ref Range   Creatinine, Urine 39 mg/dL   Total Protein, Urine <6 mg/dL   Protein Creatinine Ratio        0.00 - 0.15 mg/mg[Cre]  CBC     Status: Abnormal   Collection Time: 05/25/22  9:50 PM  Result Value Ref Range   WBC 11.6 (H) 4.0 - 10.5 K/uL   RBC 3.35 (L) 3.87 - 5.11 MIL/uL   Hemoglobin 10.2 (L) 12.0 - 15.0 g/dL   HCT 30.6 (L) 36.0 - 46.0 %   MCV 91.3 80.0 - 100.0 fL   MCH 30.4 26.0 - 34.0 pg   MCHC 33.3 30.0 - 36.0 g/dL   RDW 14.0 11.5 - 15.5 %   Platelets 280 150 - 400 K/uL   nRBC 0.0 0.0 - 0.2 %  Comprehensive metabolic panel     Status: Abnormal   Collection Time: 05/25/22  9:50 PM  Result Value Ref Range   Sodium 138 135 - 145 mmol/L   Potassium 4.2 3.5 - 5.1 mmol/L   Chloride 106 98 - 111 mmol/L   CO2 24 22 - 32 mmol/L   Glucose, Bld 93 70 - 99 mg/dL   BUN <5 (L) 6 - 20 mg/dL   Creatinine, Ser 0.60 0.44 - 1.00 mg/dL   Calcium 8.9 8.9 - 10.3 mg/dL   Total Protein 5.7 (L) 6.5 - 8.1 g/dL   Albumin 2.6 (L) 3.5 - 5.0 g/dL   AST 15 15 - 41 U/L   ALT 19 0 - 44 U/L   Alkaline Phosphatase 81 38 - 126 U/L   Total Bilirubin 0.2 (L) 0.3 - 1.2 mg/dL   GFR, Estimated >60 >60 mL/min   Anion gap 8 5 - 15  Lipase, blood     Status: None   Collection Time: 05/25/22  9:50 PM  Result Value Ref Range   Lipase 32 11 - 51 U/L   US ABDOMEN LIMITED RUQ (LIVER/GB)  Result Date: 05/25/2022 CLINICAL DATA:  Upper abdominal pain.  Thirty-three weeks pregnant EXAM: ULTRASOUND ABDOMEN LIMITED RIGHT UPPER QUADRANT COMPARISON:  None Available. FINDINGS: Gallbladder: No gallstones or wall thickening visualized. No sonographic Murphy sign noted by sonographer. Common bile duct: Diameter: 2 mm.  No intrahepatic dilation. Liver: No focal lesion identified. Within normal limits in parenchymal echogenicity. Portal vein is patent on color Doppler imaging with normal direction of blood flow towards the liver.  Other: None. IMPRESSION: Unremarkable right upper quadrant ultrasound. Electronically Signed   By: Placido Sou M.D.   On: 05/25/2022 22:17    MAU Course  Procedures  MDM Labs and [name] ordered and reviewed. Initials BPs elevated although BP cuff wrong size used on lower arm. Normal BPs after cuff changed. D  Assessment and Plan  ***  Julianne Handler 05/25/2022, 9:19 PM

## 2022-05-28 ENCOUNTER — Ambulatory Visit (INDEPENDENT_AMBULATORY_CARE_PROVIDER_SITE_OTHER): Payer: Medicaid Other | Admitting: Obstetrics & Gynecology

## 2022-05-28 VITALS — BP 133/84 | HR 101 | Wt 332.0 lb

## 2022-05-28 DIAGNOSIS — Z3A33 33 weeks gestation of pregnancy: Secondary | ICD-10-CM

## 2022-05-28 DIAGNOSIS — O10919 Unspecified pre-existing hypertension complicating pregnancy, unspecified trimester: Secondary | ICD-10-CM

## 2022-05-28 LAB — POCT URINALYSIS DIPSTICK OB
Blood, UA: NEGATIVE
Glucose, UA: NEGATIVE
Ketones, UA: NEGATIVE
Leukocytes, UA: NEGATIVE
Nitrite, UA: NEGATIVE
POC,PROTEIN,UA: NEGATIVE

## 2022-05-28 NOTE — Progress Notes (Signed)
LOW-RISK PREGNANCY VISIT Patient name: Stefanie Farmer MRN [id number]  Date of birth: Dec 21, 1995 Chief Complaint:   Routine Prenatal Visit  History of Present Illness:   Stefanie Farmer is a 26 y.o. G7P0010 female at [redacted]w[redacted]d with an Estimated Date of Delivery: 07/12/22 being seen today for ongoing management of a low-risk pregnancy.     01/01/2022   10:49 AM 09/15/2021   11:14 AM  Depression screen PHQ 2/9  Decreased Interest 0 0  Down, Depressed, Hopeless 0 0  PHQ - 2 Score 0 0  Altered sleeping 2 1  Tired, decreased energy 2 0  Change in appetite 2 1  Feeling bad or failure about yourself  0 0  Trouble concentrating 0 0  Moving slowly or fidgety/restless 0 0  Suicidal thoughts 0 0  PHQ-9 Score 6 2    Today she reports no complaints. Contractions: Not present. Vag. Bleeding: None.  Movement: Present. denies leaking of fluid. Review of Systems:   Pertinent items are noted in HPI Denies abnormal vaginal discharge w/ itching/odor/irritation, headaches, visual changes, shortness of breath, chest pain, abdominal pain, severe nausea/vomiting, or problems with urination or bowel movements unless otherwise stated above. Pertinent History Reviewed:  Reviewed past medical,surgical, social, obstetrical and family history.  Reviewed problem list, medications and allergies. Physical Assessment:   Vitals:   05/28/22 1011  BP: 133/84  Pulse: (!) 101  Weight: (!) 332 lb (150.6 kg)  Body mass index is 58.81 kg/m.        Physical Examination:   General appearance: Well appearing, and in no distress  Mental status: Alert, oriented to person, place, and time  Skin: Warm & dry  Cardiovascular: Normal heart rate noted  Respiratory: Normal respiratory effort, no distress  Abdomen: Soft, gravid, nontender  Pelvic: Cervical exam deferred         Extremities: Edema: None  Fetal Status: Fetal Heart Rate (bpm): 139 Fundal Height: 33 cm Movement: Present    Chaperone: n/a    Results for  orders placed or performed in visit on 05/28/22 (from the past 24 hour(s))  POC Urinalysis Dipstick OB   Collection Time: 05/28/22 10:17 AM  Result Value Ref Range   Color, UA     Clarity, UA     Glucose, UA Negative Negative   Bilirubin, UA     Ketones, UA negative    Spec Grav, UA     Blood, UA negative    pH, UA     POC,PROTEIN,UA Negative Negative, Trace, Small (1+), Moderate (2+), Large (3+), 4+   Urobilinogen, UA     Nitrite, UA negative    Leukocytes, UA Negative Negative   Appearance     Odor      Assessment & Plan:  1) Low-risk pregnancy G2P0010 at [redacted]w[redacted]d with an Estimated Date of Delivery: 07/12/22   2) Esophageal spasm the other night none since,    Meds: No orders of the defined types were placed in this encounter.  Labs/procedures today:   Plan:  Continue routine obstetrical care  Next visit: prefers in person    Reviewed: Preterm labor symptoms and general obstetric precautions including but not limited to vaginal bleeding, contractions, leaking of fluid and fetal movement were reviewed in detail with the patient.  All questions were answered. Has home bp cuff. Rx faxed to . Check bp weekly, let us know if >140/90.   Follow-up: Return in about 2 weeks (around 06/11/2022) for LROB.  Orders Placed This Encounter  Procedures  POC Urinalysis Dipstick OB    Lazaro Arms, MD 05/28/2022 10:49 AM

## 2022-05-30 ENCOUNTER — Encounter: Payer: Self-pay | Admitting: Women's Health

## 2022-05-30 DIAGNOSIS — O409XX0 Polyhydramnios, unspecified trimester, not applicable or unspecified: Secondary | ICD-10-CM | POA: Insufficient documentation

## 2022-05-31 NOTE — L&D Delivery Note (Signed)
[facility]N [facility] Delivery Note  Stefanie Farmer is a 27 y.o. G2P0010 s/p vag del at [redacted]w[redacted]d. She was admitted for IOL due to [place].   ROM: 16h 70m with clear fluid GBS Status: neg Maximum Maternal Temperature: 98.7  Labor Progress: Stefanie Farmer was admitted on the morning of 1/30 for IOL due to cHTN vs gHTN (some question of WCS as BPs have been labile all 3 trimesters); during labor, her BPs were mostly normal range with some rare mild range elevations; neg labs. She underwent the usual cx ripening measures with almost 24h of Pitocin post cervical foley, prior to a 4h Pitocin break at which point she became complete 5 hours after the restart. She pushed well x 50 mins.  Delivery Date/Time: February 1st, 2024 at 0610 Delivery: Called to room and patient was complete and pushing. Head delivered LOA. No nuchal cord present. Shoulder and body delivered in usual fashion. Infant with spontaneous cry, placed on mother's abdomen, dried and stimulated. Cord clamped x 2 after 1-minute delay, and cut by FOB. Cord blood drawn. Placenta delivered spontaneously with gentle cord traction. Fundus firm with massage and Pitocin. Labia, perineum, vagina, and cervix inspected and found to be intact.   Placenta: spont, intact; to L&D Complications: none Lacerations: none EBL: 114cc Analgesia: epidural  Postpartum Planning [x]  message to sent to schedule follow-up   Infant: girl  APGARs 8/9  2920g (6lb 7oz)  Myrtis Ser, CNM  07/01/2022 7:44 AM

## 2022-06-02 ENCOUNTER — Ambulatory Visit (INDEPENDENT_AMBULATORY_CARE_PROVIDER_SITE_OTHER): Payer: Medicaid Other

## 2022-06-02 DIAGNOSIS — O0993 Supervision of high risk pregnancy, unspecified, third trimester: Secondary | ICD-10-CM | POA: Diagnosis not present

## 2022-06-02 DIAGNOSIS — Z3A34 34 weeks gestation of pregnancy: Secondary | ICD-10-CM

## 2022-06-04 ENCOUNTER — Ambulatory Visit (INDEPENDENT_AMBULATORY_CARE_PROVIDER_SITE_OTHER): Payer: Medicaid Other | Admitting: Family Medicine

## 2022-06-04 ENCOUNTER — Other Ambulatory Visit: Payer: Self-pay | Admitting: Obstetrics & Gynecology

## 2022-06-04 ENCOUNTER — Encounter: Payer: Self-pay | Admitting: Family Medicine

## 2022-06-04 VITALS — BP 127/78 | HR 95 | Wt 333.2 lb

## 2022-06-04 DIAGNOSIS — O0993 Supervision of high risk pregnancy, unspecified, third trimester: Secondary | ICD-10-CM

## 2022-06-04 DIAGNOSIS — Z3A34 34 weeks gestation of pregnancy: Secondary | ICD-10-CM

## 2022-06-04 DIAGNOSIS — O409XX0 Polyhydramnios, unspecified trimester, not applicable or unspecified: Secondary | ICD-10-CM

## 2022-06-04 DIAGNOSIS — O10913 Unspecified pre-existing hypertension complicating pregnancy, third trimester: Secondary | ICD-10-CM

## 2022-06-04 DIAGNOSIS — O10919 Unspecified pre-existing hypertension complicating pregnancy, unspecified trimester: Secondary | ICD-10-CM

## 2022-06-04 NOTE — Progress Notes (Signed)
LOW-RISK PREGNANCY VISIT Patient name: Stefanie Farmer MRN [id number]  Date of birth: Apr 30, 1996 Chief Complaint:   Routine Prenatal Visit  History of Present Illness:   Alicen Donalson is a 27 y.o. G9P0010 female at [redacted]w[redacted]d with an Estimated Date of Delivery: 07/12/22 being seen today for ongoing management of a low-risk pregnancy.   Today she reports no complaints. Contractions: Not present. Vag. Bleeding: None.   . denies leaking of fluid.     01/01/2022   10:49 AM 09/15/2021   11:14 AM  Depression screen PHQ 2/9  Decreased Interest 0 0  Down, Depressed, Hopeless 0 0  PHQ - 2 Score 0 0  Altered sleeping 2 1  Tired, decreased energy 2 0  Change in appetite 2 1  Feeling bad or failure about yourself  0 0  Trouble concentrating 0 0  Moving slowly or fidgety/restless 0 0  Suicidal thoughts 0 0  PHQ-9 Score 6 2        01/01/2022   10:49 AM 09/15/2021   11:14 AM  GAD 7 : Generalized Anxiety Score  Nervous, Anxious, on Edge 0 1  Control/stop worrying 0 0  Worry too much - different things 0 0  Trouble relaxing 0 0  Restless 0 0  Easily annoyed or irritable 0 0  Afraid - awful might happen 0 0  Total GAD 7 Score 0 1      Review of Systems:   Pertinent items are noted in HPI Denies abnormal vaginal discharge w/ itching/odor/irritation, headaches, visual changes, shortness of breath, chest pain, abdominal pain, severe nausea/vomiting, or problems with urination or bowel movements unless otherwise stated above. Pertinent History Reviewed:  Reviewed past medical,surgical, social, obstetrical and family history.  Reviewed problem list, medications and allergies. Physical Assessment:   Vitals:   06/04/22 0848  BP: 127/78  Pulse: 95  Weight: (!) 333 lb 3.2 oz (151.1 kg)  Body mass index is 59.02 kg/m.        Physical Examination:   General appearance: Well appearing, and in no distress  Mental status: Alert, oriented to person, place, and time  Skin: Warm &  dry  Cardiovascular: Normal heart rate noted  Respiratory: Normal respiratory effort, no distress  Abdomen: Soft, gravid, nontender  Pelvic: Cervical exam deferred         Extremities: Edema: None  Fetal Status: 146 BMP  Chaperone: N/A   No results found for this or any previous visit (from the past 24 hour(s)).  Assessment & Plan:  1) Low-risk pregnancy G2P0010 at [redacted]w[redacted]d with an Estimated Date of Delivery: 07/12/22   2) elevated blood pressure affecting pregnancy- Likely cHTN. Blood pressure within normal limits today.  No signs of preeclampsia.  Patient did have 1 elevated BP in April prior to pregnancy and 1 elevated blood pressure at 24 weeks of pregnancy.  Normal [place] labs.  Discussed preeclampsia symptoms and return/hospital precautions and patient is understanding.   Meds: No orders of the defined types were placed in this encounter.  Labs/procedures today: none  Plan:  Continue routine obstetrical care  Next visit: prefers in person    Reviewed: Preterm labor symptoms and general obstetric precautions including but not limited to vaginal bleeding, contractions, leaking of fluid and fetal movement were reviewed in detail with the patient.  All questions were answered. Does have home bp cuff. Office bp cuff given: not applicable. Check bp weekly, let us know if consistently >140 and/or >90.  Follow-up: No follow-ups on file.  Future Appointments  Date Time Provider [place]  06/07/2022  8:30 AM [facility] [name] [facility] None  06/07/2022  9:30 AM Roma Schanz, CNM [facility]  06/10/2022 10:10 AM [facility] NURSE [facility]  06/14/2022  9:15 AM [facility] [name] [facility] None  06/14/2022 10:10 AM Roma Schanz, CNM [facility]  06/17/2022 10:10 AM [facility] NURSE [facility]  06/21/2022  8:30 AM [facility] [name] [facility] None  06/21/2022  9:30 AM Christin Fudge, CNM [facility]  06/24/2022 10:10 AM [facility] NURSE [facility]   06/28/2022  8:30 AM [facility] [name] [facility] None  06/28/2022  9:30 AM Roma Schanz, CNM [facility]  07/01/2022 10:10 AM [facility] NURSE [facility]  07/05/2022  8:30 AM [place] - [facility] [name] [facility] None  07/05/2022  9:30 AM Christin Fudge, CNM [facility]  07/08/2022 10:30 AM [facility] NURSE [facility]    No orders of the defined types were placed in this encounter.  Concepcion Living CNM, WHNP-BC 06/04/2022 9:00 AM

## 2022-06-07 ENCOUNTER — Ambulatory Visit (INDEPENDENT_AMBULATORY_CARE_PROVIDER_SITE_OTHER): Payer: Medicaid Other | Admitting: Advanced Practice Midwife

## 2022-06-07 ENCOUNTER — Ambulatory Visit (INDEPENDENT_AMBULATORY_CARE_PROVIDER_SITE_OTHER): Payer: Medicaid Other

## 2022-06-07 ENCOUNTER — Encounter: Payer: Self-pay | Admitting: Advanced Practice Midwife

## 2022-06-07 VITALS — BP 117/79 | HR 97 | Wt 332.0 lb

## 2022-06-07 DIAGNOSIS — O0993 Supervision of high risk pregnancy, unspecified, third trimester: Secondary | ICD-10-CM

## 2022-06-07 DIAGNOSIS — Z3A35 35 weeks gestation of pregnancy: Secondary | ICD-10-CM

## 2022-06-07 DIAGNOSIS — O10913 Unspecified pre-existing hypertension complicating pregnancy, third trimester: Secondary | ICD-10-CM | POA: Diagnosis not present

## 2022-06-07 DIAGNOSIS — O409XX0 Polyhydramnios, unspecified trimester, not applicable or unspecified: Secondary | ICD-10-CM

## 2022-06-07 DIAGNOSIS — Z6841 Body Mass Index (BMI) 40.0 and over, adult: Secondary | ICD-10-CM

## 2022-06-07 DIAGNOSIS — O10919 Unspecified pre-existing hypertension complicating pregnancy, unspecified trimester: Secondary | ICD-10-CM

## 2022-06-07 NOTE — Patient Instructions (Signed)
[employer], I greatly value your feedback.  If you receive a survey following your visit with [name] today, we appreciate you taking the time to fill it out.  Thanks, Nigel Berthold, DNP, CNM  [place]!!! It is now [place] at [facility] ([place], [place] [address]) Entrance located off of [address] parking   Go to [email or web address] to register for FREE online childbirth classes    Call the office [phone]) or go to [place] if: You begin to have strong, frequent contractions Your water breaks.  Sometimes it is a big gush of fluid, sometimes it is just a trickle that keeps getting your panties wet or running down your legs You have vaginal bleeding.  It is normal to have a small amount of spotting if your cervix was checked.  You don't feel your baby moving like normal.  If you don't, get you something to eat and drink and lay down and focus on feeling your baby move.  You should feel at least 10 movements in 2 hours.  If you don't, you should call the office or go to [place] Blood Pressure Monitoring for Patients   Your provider has recommended that you check your blood pressure (BP) at least once a week at home. If you do not have a blood pressure cuff at home, one will be provided for you. Contact your provider if you have not received your monitor within 1 week.   Helpful Tips for Accurate Home Blood Pressure Checks  Don't smoke, exercise, or drink caffeine 30 minutes before checking your BP Use the restroom before checking your BP (a full bladder can raise your pressure) Relax in a comfortable upright chair Feet on the ground Left arm resting comfortably on a flat surface at the level of your heart Legs uncrossed Back supported Sit quietly and don't talk Place the cuff on your bare arm Adjust snuggly, so that only two fingertips can fit between your skin and the top  of the cuff Check 2 readings separated by at least one minute Keep a log of your BP readings For a visual, please reference this diagram: http://ccnc.care/bpdiagram  Provider Name: [facility]     Phone: [phone]  Zone 1: ALL CLEAR  Continue to monitor your symptoms:  BP reading is less than 140 (top number) or less than 90 (bottom number)  No right upper stomach pain No headaches or seeing spots No feeling nauseated or throwing up No swelling in face and hands  Zone 2: CAUTION Call your doctor's office for any of the following:  BP reading is greater than 140 (top number) or greater than 90 (bottom number)  Stomach pain under your ribs in the middle or right side Headaches or seeing spots Feeling nauseated or throwing up Swelling in face and hands  Zone 3: EMERGENCY  Seek immediate medical care if you have any of the following:  BP reading is greater than160 (top number) or greater than 110 (bottom number) Severe headaches not improving with Tylenol Serious difficulty catching your breath Any worsening symptoms from Zone 2

## 2022-06-07 NOTE — Progress Notes (Signed)
HIGH-RISK PREGNANCY VISIT Patient name: Stefanie Farmer MRN [id number]  Date of birth: 06-07-1995 Chief Complaint:   Routine Prenatal Visit  History of Present Illness:   Stefanie Farmer is a 27 y.o. G37P0010 female at [redacted]w[redacted]d with an Estimated Date of Delivery: 07/12/22 being seen today for ongoing management of a high-risk pregnancy complicated by gestational hypertension, polyhydramnios, morbid obesity c.    Today she reports no complaints. Contractions: Not present. Vag. Bleeding: None.  Movement: Present. denies leaking of fluid.      01/01/2022   10:49 AM 09/15/2021   11:14 AM  Depression screen PHQ 2/9  Decreased Interest 0 0  Down, Depressed, Hopeless 0 0  PHQ - 2 Score 0 0  Altered sleeping 2 1  Tired, decreased energy 2 0  Change in appetite 2 1  Feeling bad or failure about yourself  0 0  Trouble concentrating 0 0  Moving slowly or fidgety/restless 0 0  Suicidal thoughts 0 0  PHQ-9 Score 6 2        01/01/2022   10:49 AM 09/15/2021   11:14 AM  GAD 7 : Generalized Anxiety Score  Nervous, Anxious, on Edge 0 1  Control/stop worrying 0 0  Worry too much - different things 0 0  Trouble relaxing 0 0  Restless 0 0  Easily annoyed or irritable 0 0  Afraid - awful might happen 0 0  Total GAD 7 Score 0 1     Review of Systems:   Pertinent items are noted in HPI Denies abnormal vaginal discharge w/ itching/odor/irritation, headaches, visual changes, shortness of breath, chest pain, abdominal pain, severe nausea/vomiting, or problems with urination or bowel movements unless otherwise stated above. Pertinent History Reviewed:  Reviewed past medical,surgical, social, obstetrical and family history.  Reviewed problem list, medications and allergies. Physical Assessment:   Vitals:   06/07/22 0905  BP: 117/79  Pulse: 97  Weight: (!) 332 lb (150.6 kg)  Body mass index is 58.81 kg/m.           Physical Examination:   General appearance: alert, well appearing, and in no  distress  Mental status: alert, oriented to person, place, and time  Skin: warm & dry   Extremities:      Cardiovascular: normal heart rate noted  Respiratory: normal respiratory effort, no distress  Abdomen: gravid, soft, non-tender  Pelvic: Cervical exam deferred         Fetal Status:     Movement: Present    Fetal Surveillance Testing today:     [name] 35 wks,cephalic,anterior placenta gr 1,AFI 28.4 cm,polyhydramnios, FHR 159 bpm,BPP 8/8       Chaperone: N/A    No results found for this or any previous visit (from the past 24 hour(s)).  Assessment & Plan:  High-risk pregnancy: G2P0010 at [redacted]w[redacted]d with an Estimated Date of Delivery: 07/12/22      ICD-10-CM   1. High-risk pregnancy in third trimester  O09.93     2. [redacted] weeks gestation of pregnancy  Z3A.35     3. Chronic hypertension affecting pregnancy  O10.919     4. Morbid obesity with BMI of 45.0-49.9, adult (HCC)  E66.01    Z68.42     5. Polyhydramnios affecting pregnancy  O40.9XX0         Meds: No orders of the defined types were placed in this encounter.   Orders: No orders of the defined types were placed in this encounter.    Labs/procedures today: BPP  Reviewed: Preterm labor symptoms and general obstetric precautions including but not limited to vaginal bleeding, contractions, leaking of fluid and fetal movement were reviewed in detail with the patient.  All questions were answered.  Follow-up: Return for As scheduled.   Future Appointments  Date Time Provider [place]  06/10/2022 10:10 AM [facility] NURSE [facility]  06/14/2022  9:15 AM [facility] - [facility] [name] [facility] None  06/14/2022 10:10 AM Roma Schanz, CNM [facility] FTO[facility]YN  06/17/2022 10:10 AM [facility] NURSE [facility]FT [facility]TO[facility]YN  06/21/2022  8:30 AM [facility] [name] [facility] None  06/21/2022  9:30 AM Christin Fudge, CNM [facility]FT FTOBGYN  06/24/2022 10:10 AM [facility] NURSE [facility]FT [facility]YN  06/28/2022  8:30 AM [facility] - [facility] [name]  [facility] None  06/28/2022  9:30 AM Roma Schanz, CNM [facility]  07/01/2022 10:10 AM [facility] NURSE [facility]-FT FTOBGYN  07/05/2022  8:30 AM [place] - FTOBGYN [name] [facility] None  07/05/2022  9:30 AM Christin Fudge, CNM [facility]FT FTOBGYN  07/08/2022 10:30 AM [facility] NURSE [facility]    No orders of the defined types were placed in this encounter.  Christin Fudge , DNP, [facility] 06/07/2022 10:06 AM

## 2022-06-07 NOTE — Progress Notes (Addendum)
[name] 35 wks,cephalic,anterior placenta gr 1,AFI 28.4 cm,polyhydramnios, FHR 159 bpm,BPP 8/8

## 2022-06-10 ENCOUNTER — Ambulatory Visit (INDEPENDENT_AMBULATORY_CARE_PROVIDER_SITE_OTHER): Payer: Medicaid Other | Admitting: *Deleted

## 2022-06-10 VITALS — BP 126/78 | HR 86 | Wt 332.6 lb

## 2022-06-10 DIAGNOSIS — Z3A35 35 weeks gestation of pregnancy: Secondary | ICD-10-CM

## 2022-06-10 DIAGNOSIS — O0993 Supervision of high risk pregnancy, unspecified, third trimester: Secondary | ICD-10-CM | POA: Diagnosis not present

## 2022-06-10 DIAGNOSIS — O409XX0 Polyhydramnios, unspecified trimester, not applicable or unspecified: Secondary | ICD-10-CM

## 2022-06-10 NOTE — Progress Notes (Addendum)
   NURSE VISIT- NST  SUBJECTIVE:  Stefanie Farmer is a 27 y.o. G2P0010 female at [redacted]w[redacted]d, here for a NST for pregnancy complicated by [facility] and Polyhydramnios.  She reports active fetal movement, contractions: none, vaginal bleeding: none, membranes: intact.   OBJECTIVE:  BP 126/78   Pulse 86   Wt (!) 332 lb 9.6 oz (150.9 kg)   LMP 10/05/2021   BMI 58.92 kg/m   Appears well, no apparent distress  No results found for this or any previous visit (from the past 24 hour(s)).  NST: FHR baseline 135 bpm, Variability: moderate, Accelerations:present, Decelerations:  Absent= Cat 1/reactive Toco: none   ASSESSMENT: G2P0010 at [redacted]w[redacted]d with GHTN and Polyhydramnios NST reactive  PLAN: EFM strip reviewed by Nigel Berthold, CNM   Recommendations: keep next appointment as scheduled    Alice Rieger  06/10/2022 10:55 AM   Chart reviewed for nurse visit. Agree with plan of care.  Christin Fudge, [place] 06/10/2022 11:55 AM

## 2022-06-11 ENCOUNTER — Other Ambulatory Visit: Payer: Self-pay | Admitting: Women's Health

## 2022-06-11 DIAGNOSIS — O10919 Unspecified pre-existing hypertension complicating pregnancy, unspecified trimester: Secondary | ICD-10-CM

## 2022-06-11 DIAGNOSIS — O0993 Supervision of high risk pregnancy, unspecified, third trimester: Secondary | ICD-10-CM

## 2022-06-14 ENCOUNTER — Ambulatory Visit (INDEPENDENT_AMBULATORY_CARE_PROVIDER_SITE_OTHER): Payer: Medicaid Other | Admitting: Women's Health

## 2022-06-14 ENCOUNTER — Ambulatory Visit (INDEPENDENT_AMBULATORY_CARE_PROVIDER_SITE_OTHER): Payer: Medicaid Other

## 2022-06-14 ENCOUNTER — Other Ambulatory Visit (HOSPITAL_COMMUNITY)
Admission: RE | Admit: 2022-06-14 | Discharge: 2022-06-14 | Disposition: A | Discharge status: Home / Self Care | Payer: Medicaid Other | Source: Ambulatory Visit | Attending: Women's Health | Admitting: Women's Health

## 2022-06-14 ENCOUNTER — Encounter: Payer: Self-pay | Admitting: Women's Health

## 2022-06-14 VITALS — BP 139/79 | HR 86 | Wt 336.0 lb

## 2022-06-14 DIAGNOSIS — O0993 Supervision of high risk pregnancy, unspecified, third trimester: Secondary | ICD-10-CM

## 2022-06-14 DIAGNOSIS — Z1332 Encounter for screening for maternal depression: Secondary | ICD-10-CM

## 2022-06-14 DIAGNOSIS — Z3A36 36 weeks gestation of pregnancy: Secondary | ICD-10-CM | POA: Insufficient documentation

## 2022-06-14 DIAGNOSIS — O10919 Unspecified pre-existing hypertension complicating pregnancy, unspecified trimester: Secondary | ICD-10-CM | POA: Diagnosis not present

## 2022-06-14 DIAGNOSIS — Z6841 Body Mass Index (BMI) 40.0 and over, adult: Secondary | ICD-10-CM

## 2022-06-14 DIAGNOSIS — O409XX0 Polyhydramnios, unspecified trimester, not applicable or unspecified: Secondary | ICD-10-CM

## 2022-06-14 LAB — POCT URINALYSIS DIPSTICK OB
Blood, UA: NEGATIVE
Glucose, UA: NEGATIVE
Ketones, UA: NEGATIVE
Nitrite, UA: NEGATIVE
POC,PROTEIN,UA: NEGATIVE

## 2022-06-14 NOTE — Progress Notes (Signed)
Stefanie Farmer Patient name: Stefanie Farmer MRN [id number]  Date of birth: 11-19-1995 Chief Complaint:   High Risk Gestation ([name] today; GBS, GC/CHL; feels like she has a yeast infection)  History of Present Illness:   Stefanie Farmer is a 27 y.o. G59P0010 female at [redacted]w[redacted]d with an Estimated Date of Delivery: 07/12/22 being seen today for ongoing management of a high-risk pregnancy complicated by [facility] vs GHTN, mild polyhydramnios, BMI 59.    Today she reports  was on amoxcillin last week for dental infection, think she has yeast infection- some itching and white d/c . Not checking home bp's regularly- did not get larger cuff for her machine, grandparents have machine w/ her appropriate sized cuff and checks it about q3d when she is there and all have been wnl. Denies ha, visual changes, ruq/epigastric pain, n/v.   Contractions: Not present. Vag. Bleeding: None.  Movement: Present. denies leaking of fluid.      06/14/2022   10:13 AM 01/01/2022   10:49 AM 09/15/2021   11:14 AM  Depression screen PHQ 2/9  Decreased Interest 0 0 0  Down, Depressed, Hopeless 0 0 0  PHQ - 2 Score 0 0 0  Altered sleeping 1 2 1   Tired, decreased energy 1 2 0  Change in appetite 0 2 1  Feeling bad or failure about yourself  0 0 0  Trouble concentrating 0 0 0  Moving slowly or fidgety/restless 0 0 0  Suicidal thoughts 0 0 0  PHQ-9 Score 2 6 2   Difficult doing work/chores Not difficult at all          06/14/2022   10:15 AM 01/01/2022   10:49 AM 09/15/2021   11:14 AM  GAD 7 : Generalized Anxiety Score  Nervous, Anxious, on Edge 1 0 1  Control/stop worrying 0 0 0  Worry too much - different things 0 0 0  Trouble relaxing 0 0 0  Restless 0 0 0  Easily annoyed or irritable 1 0 0  Afraid - awful might happen 0 0 0  Total GAD 7 Score 2 0 1  Anxiety Difficulty Not difficult at all       Review of Systems:   Pertinent items are noted in HPI Denies abnormal vaginal discharge w/ itching/odor/irritation,  headaches, visual changes, shortness of breath, chest pain, abdominal pain, severe nausea/vomiting, or problems with urination or bowel movements unless otherwise stated above. Pertinent History Reviewed:  Reviewed past medical,surgical, social, obstetrical and family history.  Reviewed problem list, medications and allergies. Physical Assessment:   Vitals:   06/14/22 1010  BP: 139/79  Pulse: 86  Weight: (!) 336 lb (152.4 kg)  Body mass index is 59.52 kg/m.           Physical Examination:   General appearance: alert, well appearing, and in no distress  Mental status: alert, oriented to person, place, and time  Skin: warm & dry   Extremities:      Cardiovascular: normal heart rate noted  Respiratory: normal respiratory effort, no distress  Abdomen: gravid, soft, non-tender  Pelvic:  GBS culture collected 1st, then spec exam: cx visually closed, no obvious yeast d/c, no odor. CV swab obtained          Fetal Status: Fetal Heart Rate (bpm): 142 u/s   Movement: Present    Fetal Surveillance Testing today: [name] 36 wks,cephalic,BPP 5/0,NLZ 26 cm,polyhydramnios,FHR 142 bpm,anterior placenta gr 1,EFW 2674 g 35%,RI .64,.64,.64=80%   Chaperone: Levy Pupa    Results  for orders placed or performed in Farmer on 06/14/22 (from the past 24 hour(s))  POC Urinalysis Dipstick OB   Collection Time: 06/14/22 10:05 AM  Result Value Ref Range   Color, UA     Clarity, UA     Glucose, UA Negative Negative   Bilirubin, UA     Ketones, UA neg    Spec Grav, UA     Blood, UA neg    pH, UA     POC,PROTEIN,UA Negative Negative, Trace, Small (1+), Moderate (2+), Large (3+), 4+   Urobilinogen, UA     Nitrite, UA neg    Leukocytes, UA Trace (A) Negative   Appearance     Odor      Assessment & Plan:  High-risk pregnancy: G2P0010 at [redacted]w[redacted]d with an Estimated Date of Delivery: 07/12/22   1) CHTN vs GHTN, stable, ASA, no meds, borderline bp, no proteinuria. To borrow grandparents machine w/ her appropriate  sized cuff, check BID, reviewed pre-e s/s, reasons to seek care  2) Mild polyhydramnios, AFI 26cm today (was 28.4cm w/ MFM @ 33wk)  3) BMI 59  4) Possible yeast infection> CV swab sent, if candida can use otc monistat 7   Meds: No orders of the defined types were placed in this encounter.   Labs/procedures today: CV swab, GBS, and U/S  Treatment Plan: discussed timing of delivery w/ Dr. Elonda Husky (unclear CHTN vs GHTN, as well poly, BMI 59)- recommends 38-39wks unless indicated earlier  Reviewed: Preterm labor symptoms and general obstetric precautions including but not limited to vaginal bleeding, contractions, leaking of fluid and fetal movement were reviewed in detail with the patient.  All questions were answered.   Follow-up: Return for As scheduled.   Future Appointments  Date Time Provider [place]  06/17/2022 10:10 AM [facility]-FTOB[facility] NURSE [facility]-FT FTOBGYN  06/21/2022  8:30 AM [place] - FTOBGYN [name] [facility] None  06/21/2022  9:30 AM Christin Fudge, CNM [facility]-FT FTOBGYN  06/24/2022 10:10 AM [facility]FT[facility] NURSE [facility]-FT FTOBGYN  06/28/2022  8:30 AM [facility] - [facility] [name] [facility] None  06/28/2022  9:30 AM Roma Schanz, CNM [facility]-FT FTOBGYN  07/01/2022 10:10 AM [facility]FTOBGYN NURSE [facility]-FT FTOBGYN  07/05/2022  8:30 AM [facility] - [facility] [name] [facility] None  07/05/2022  9:30 AM Christin Fudge, CNM [facility]-FT FTOBGYN  07/08/2022 10:30 AM [facility]-FT[facility]GYN NURSE [facility]-FT FTOBGYN    Orders Placed This Encounter  Procedures   Strep Gp B NAA   POC Urinalysis Dipstick OB   Roma Schanz CNM, [facility] 06/14/2022 10:59 AM

## 2022-06-14 NOTE — Progress Notes (Signed)
[name] 36 wks,cephalic,BPP 3/3,KPQ 26 cm,polyhydramnios,FHR 142 bpm,anterior placenta gr 1,EFW 2674 g 35%,RI .64,.64,.64=80%

## 2022-06-14 NOTE — Patient Instructions (Signed)
Stefanie Farmer, thank you for choosing our office today! We appreciate the opportunity to meet your healthcare needs. You may receive a short survey by mail, e-mail, or through MyChart. If you are happy with your care we would appreciate if you could take just a few minutes to complete the survey questions. We read all of your comments and take your feedback very seriously. Thank you again for choosing our office.  [facility] ([address] [place], [place] [address]) Entrance C, located off of [address] Free 24/7 valet parking   CLASSES: Go to [email or web address] to register for classes (childbirth, breastfeeding, waterbirth, infant CPR, daddy bootcamp, etc.)  Call the office ([phone]) or go to [facility] if: You begin to have strong, frequent contractions Your water breaks.  Sometimes it is a big gush of fluid, sometimes it is just a trickle that keeps getting your panties wet or running down your legs You have vaginal bleeding.  It is normal to have a small amount of spotting if your cervix was checked.  You don't feel your baby moving like normal.  If you don't, get you something to eat and drink and lay down and focus on feeling your baby move.   If your baby is still not moving like normal, you should call the office or go to [facility].  Call the office ([phone]) or go to [facility] for these signs of pre-eclampsia: Severe headache that does not go away with Tylenol Visual changes- seeing spots, double, blurred vision Pain under your right breast or upper abdomen that does not go away with Tums or heartburn medicine Nausea and/or vomiting Severe swelling in your hands, feet, and face   Tdap Vaccine It is recommended that you get the Tdap vaccine during the third trimester of EACH pregnancy to help protect your baby from getting pertussis (whooping cough) 27-36 weeks is the BEST time to do  this so that you can pass the protection on to your baby. During pregnancy is better than after pregnancy, but if you are unable to get it during pregnancy it will be offered at the hospital.  You can get this vaccine with us, at the health department, your family doctor, or some local pharmacies Everyone who will be around your baby should also be up-to-date on their vaccines before the baby comes. Adults (who are not pregnant) only need 1 dose of Tdap during adulthood.   [place] Pediatricians/Family Doctors [facility] ([facility]): [address], [phone]           [facility]: [address], [phone]                [facility] ([facility]): [address], [phone] (call to ask if accepting patients) [facility]: [address], [place], [phone]    [place] Pediatricians/Family Doctors [facility] ([facility]): [address], [phone] [facility]: [address], [phone] [facility]: [address], [phone]  [place] [facility]  [facility] ([facility]): [phone] [facility]: [address], [phone]   [place] [facility]: [address], [phone]  [place] [facility]  [facility]: [address], [phone]  Home Blood Pressure Monitoring for Patients   Your provider has recommended that you check your   blood pressure (BP) at least once a week at home. If you do not have a blood pressure cuff at home, one will be provided for you. Contact your provider if you have not received your monitor within 1 week.   Helpful Tips for Accurate Home Blood Pressure Checks  Don't smoke, exercise, or drink caffeine 30 minutes before checking your BP Use the restroom before checking your BP (a full bladder can raise your  pressure) Relax in a comfortable upright chair Feet on the ground Left arm resting comfortably on a flat surface at the level of your heart Legs uncrossed Back supported Sit quietly and don't talk Place the cuff on your bare arm Adjust snuggly, so that only two fingertips can fit between your skin and the top of the cuff Check 2 readings separated by at least one minute Keep a log of your BP readings For a visual, please reference this diagram: http://ccnc.care/bpdiagram  Provider Name: [facility]     Phone: [phone]  Zone 1: ALL CLEAR  Continue to monitor your symptoms:  BP reading is less than 140 (top number) or less than 90 (bottom number)  No right upper stomach pain No headaches or seeing spots No feeling nauseated or throwing up No swelling in face and hands  Zone 2: CAUTION Call your doctor's office for any of the following:  BP reading is greater than 140 (top number) or greater than 90 (bottom number)  Stomach pain under your ribs in the middle or right side Headaches or seeing spots Feeling nauseated or throwing up Swelling in face and hands  Zone 3: EMERGENCY  Seek immediate medical care if you have any of the following:  BP reading is greater than160 (top number) or greater than 110 (bottom number) Severe headaches not improving with Tylenol Serious difficulty catching your breath Any worsening symptoms from Zone 2  Preterm Labor and Birth Information  The normal length of a pregnancy is 39-41 weeks. Preterm labor is when labor starts before 37 completed weeks of pregnancy. What are the risk factors for preterm labor? Preterm labor is more likely to occur in women who: Have certain infections during pregnancy such as a bladder infection, sexually transmitted infection, or infection inside the uterus (chorioamnionitis). Have a shorter-than-normal cervix. Have gone into preterm labor before. Have had surgery on their cervix. Are younger than age 17  or older than age 35. Are African American. Are pregnant with twins or multiple babies (multiple gestation). Take street drugs or smoke while pregnant. Do not gain enough weight while pregnant. Became pregnant shortly after having been pregnant. What are the symptoms of preterm labor? Symptoms of preterm labor include: Cramps similar to those that can happen during a menstrual period. The cramps may happen with diarrhea. Pain in the abdomen or lower back. Regular uterine contractions that may feel like tightening of the abdomen. A feeling of increased pressure in the pelvis. Increased watery or bloody mucus discharge from the vagina. Water breaking (ruptured amniotic sac). Why is it important to recognize signs of preterm labor? It is important to recognize signs of preterm labor because babies who are born prematurely may not be fully developed. This can put them at an increased risk for: Long-term (chronic) heart and lung problems. Difficulty immediately after birth with regulating body systems, including blood sugar, body temperature, heart rate, and breathing rate. Bleeding in the brain. Cerebral palsy. Learning difficulties. Death. These risks are highest for babies who are born before 34 weeks   of pregnancy. How is preterm labor treated? Treatment depends on the length of your pregnancy, your condition, and the health of your baby. It may involve: Having a stitch (suture) placed in your cervix to prevent your cervix from opening too early (cerclage). Taking or being given medicines, such as: Hormone medicines. These may be given early in pregnancy to help support the pregnancy. Medicine to stop contractions. Medicines to help mature the baby's lungs. These may be prescribed if the risk of delivery is high. Medicines to prevent your baby from developing cerebral palsy. If the labor happens before 34 weeks of pregnancy, you may need to stay in the hospital. What should I do if I  think I am in preterm labor? If you think that you are going into preterm labor, call your health care provider right away. How can I prevent preterm labor in future pregnancies? To increase your chance of having a full-term pregnancy: Do not use any tobacco products, such as cigarettes, chewing tobacco, and e-cigarettes. If you need help quitting, ask your health care provider. Do not use street drugs or medicines that have not been prescribed to you during your pregnancy. Talk with your health care provider before taking any herbal supplements, even if you have been taking them regularly. Make sure you gain a healthy amount of weight during your pregnancy. Watch for infection. If you think that you might have an infection, get it checked right away. Make sure to tell your health care provider if you have gone into preterm labor before. This information is not intended to replace advice given to you by your health care provider. Make sure you discuss any questions you have with your health care provider. Document Revised: 09/08/2018 Document Reviewed: 10/08/2015 Elsevier Patient Education  2020 Elsevier Inc.   

## 2022-06-15 LAB — CERVICOVAGINAL ANCILLARY ONLY
Bacterial Vaginitis (gardnerella): NEGATIVE
Candida Glabrata: NEGATIVE
Candida Vaginitis: POSITIVE — AB
Chlamydia: NEGATIVE
Comment: NEGATIVE
Comment: NEGATIVE
Comment: NEGATIVE
Comment: NEGATIVE
Comment: NORMAL
Neisseria Gonorrhea: NEGATIVE

## 2022-06-16 LAB — STREP GP B NAA: Strep Gp B NAA: NEGATIVE

## 2022-06-17 ENCOUNTER — Ambulatory Visit (INDEPENDENT_AMBULATORY_CARE_PROVIDER_SITE_OTHER): Payer: Medicaid Other | Admitting: *Deleted

## 2022-06-17 VITALS — BP 114/73 | HR 111 | Wt 334.8 lb

## 2022-06-17 DIAGNOSIS — O0993 Supervision of high risk pregnancy, unspecified, third trimester: Secondary | ICD-10-CM | POA: Diagnosis not present

## 2022-06-17 DIAGNOSIS — Z3A36 36 weeks gestation of pregnancy: Secondary | ICD-10-CM | POA: Diagnosis not present

## 2022-06-17 LAB — POCT URINALYSIS DIPSTICK OB
Glucose, UA: NEGATIVE
Ketones, UA: NEGATIVE
Leukocytes, UA: NEGATIVE
Nitrite, UA: NEGATIVE
POC,PROTEIN,UA: NEGATIVE

## 2022-06-17 NOTE — Progress Notes (Signed)
   NURSE VISIT- NST  SUBJECTIVE:  Stefanie Farmer is a 27 y.o. G2P0010 female at [redacted]w[redacted]d, here for a NST for pregnancy complicated by [facility] and Polyhydramnios.  She reports active fetal movement, contractions: none, vaginal bleeding: none, membranes: intact.   OBJECTIVE:  BP 114/73   Pulse (!) 111   Wt (!) 334 lb 12.8 oz (151.9 kg)   LMP 10/05/2021   BMI 59.31 kg/m   Appears well, no apparent distress  Results for orders placed or performed in visit on 06/17/22 (from the past 24 hour(s))  POC Urinalysis Dipstick OB   Collection Time: 06/17/22 10:44 AM  Result Value Ref Range   Color, UA     Clarity, UA     Glucose, UA Negative Negative   Bilirubin, UA     Ketones, UA neg    Spec Grav, UA     Blood, UA trace    pH, UA     POC,PROTEIN,UA Negative Negative, Trace, Small (1+), Moderate (2+), Large (3+), 4+   Urobilinogen, UA     Nitrite, UA neg    Leukocytes, UA Negative Negative   Appearance     Odor      NST: FHR baseline 120 bpm, Variability: moderate, Accelerations:present, Decelerations:  Absent= Cat 1/reactive Toco: none   ASSESSMENT: G2P0010 at [redacted]w[redacted]d with GHTN and Polyhydramnios NST reactive  PLAN: EFM strip reviewed by Dr. Elonda Husky   Recommendations: keep next appointment as scheduled    [name]  06/17/2022 12:54 PM

## 2022-06-18 ENCOUNTER — Other Ambulatory Visit: Payer: Self-pay | Admitting: Obstetrics & Gynecology

## 2022-06-18 DIAGNOSIS — O10913 Unspecified pre-existing hypertension complicating pregnancy, third trimester: Secondary | ICD-10-CM

## 2022-06-18 DIAGNOSIS — O403XX0 Polyhydramnios, third trimester, not applicable or unspecified: Secondary | ICD-10-CM

## 2022-06-21 ENCOUNTER — Ambulatory Visit (INDEPENDENT_AMBULATORY_CARE_PROVIDER_SITE_OTHER): Payer: Medicaid Other

## 2022-06-21 ENCOUNTER — Ambulatory Visit (INDEPENDENT_AMBULATORY_CARE_PROVIDER_SITE_OTHER): Payer: Medicaid Other | Admitting: Advanced Practice Midwife

## 2022-06-21 ENCOUNTER — Encounter: Payer: Self-pay | Admitting: Advanced Practice Midwife

## 2022-06-21 VITALS — BP 123/85 | HR 78 | Wt 336.6 lb

## 2022-06-21 DIAGNOSIS — O10919 Unspecified pre-existing hypertension complicating pregnancy, unspecified trimester: Secondary | ICD-10-CM

## 2022-06-21 DIAGNOSIS — O403XX0 Polyhydramnios, third trimester, not applicable or unspecified: Secondary | ICD-10-CM | POA: Diagnosis not present

## 2022-06-21 DIAGNOSIS — Z3A37 37 weeks gestation of pregnancy: Secondary | ICD-10-CM | POA: Diagnosis not present

## 2022-06-21 DIAGNOSIS — O0993 Supervision of high risk pregnancy, unspecified, third trimester: Secondary | ICD-10-CM

## 2022-06-21 DIAGNOSIS — O10913 Unspecified pre-existing hypertension complicating pregnancy, third trimester: Secondary | ICD-10-CM

## 2022-06-21 DIAGNOSIS — O409XX0 Polyhydramnios, unspecified trimester, not applicable or unspecified: Secondary | ICD-10-CM

## 2022-06-21 NOTE — Progress Notes (Signed)
[facility] PREGNANCY VISIT Patient name: Neiva Maenza MRN [id number]  Date of birth: Dec 10, 1995 Chief Complaint:   Routine Prenatal Visit, High Risk Gestation, and Pregnancy Ultrasound  History of Present Illness:   Ladawn Boullion is a 27 y.o. G68P0010 female at [redacted]w[redacted]d with an Estimated Date of Delivery: 07/12/22 being seen today for ongoing management of a high-risk pregnancy complicated by [facility] vs GHTN, mild poly, no meds.    Today she reports no complaints. Contractions: Not present.  .  Movement: Present. denies leaking of fluid.      06/14/2022   10:13 AM 01/01/2022   10:49 AM 09/15/2021   11:14 AM  Depression screen PHQ 2/9  Decreased Interest 0 0 0  Down, Depressed, Hopeless 0 0 0  PHQ - 2 Score 0 0 0  Altered sleeping 1 2 1   Tired, decreased energy 1 2 0  Change in appetite 0 2 1  Feeling bad or failure about yourself  0 0 0  Trouble concentrating 0 0 0  Moving slowly or fidgety/restless 0 0 0  Suicidal thoughts 0 0 0  PHQ-9 Score 2 6 2   Difficult doing work/chores Not difficult at all          06/14/2022   10:15 AM 01/01/2022   10:49 AM 09/15/2021   11:14 AM  GAD 7 : Generalized Anxiety Score  Nervous, Anxious, on Edge 1 0 1  Control/stop worrying 0 0 0  Worry too much - different things 0 0 0  Trouble relaxing 0 0 0  Restless 0 0 0  Easily annoyed or irritable 1 0 0  Afraid - awful might happen 0 0 0  Total GAD 7 Score 2 0 1  Anxiety Difficulty Not difficult at all       Review of Systems:   Pertinent items are noted in HPI Denies abnormal vaginal discharge w/ itching/odor/irritation, headaches, visual changes, shortness of breath, chest pain, abdominal pain, severe nausea/vomiting, or problems with urination or bowel movements unless otherwise stated above. Pertinent History Reviewed:  Reviewed past medical,surgical, social, obstetrical and family history.  Reviewed problem list, medications and allergies. Physical Assessment:   Vitals:   06/21/22 0918   BP: 123/85  Pulse: 78  Weight: (!) 336 lb 9.6 oz (152.7 kg)  Body mass index is 59.63 kg/m.           Physical Examination:   General appearance: alert, well appearing, and in no distress  Mental status: alert, oriented to person, place, and time  Skin: warm & dry   Extremities: Edema: Trace    Cardiovascular: normal heart rate noted  Respiratory: normal respiratory effort, no distress  Abdomen: gravid, soft, non-tender  Pelvic: Cervical exam deferred         Fetal Status:     Movement: Present    [name] 37 wks,cephalic,BPP 4/0,JWJXBJYN UAD with EDF,RI.64,.68,.70=90%,AFI 26 cm,polyhydramnios,FHR 148 bpm,anterior placenta gr 2      Fetal Surveillance Testing today: BPP, dopplers   Chaperone: N/A    No results found for this or any previous visit (from the past 24 hour(s)).  Assessment & Plan:  High-risk pregnancy: G2P0010 at [redacted]w[redacted]d with an Estimated Date of Delivery: 07/12/22      ICD-10-CM   1. High-risk pregnancy in third trimester  O09.93 CBC    Type and screen    ABO/Rh    RPR    Comprehensive metabolic panel    Protein / creatinine ratio, urine    2. [redacted] weeks gestation  of pregnancy  Z3A.37     3. Chronic hypertension affecting pregnancy  O10.919    IOL scheduled for 06/29/22,orders in    4. Polyhydramnios affecting pregnancy  O40.9XX0    Mild        Meds: No orders of the defined types were placed in this encounter.   Orders: No orders of the defined types were placed in this encounter.    Reviewed: Term labor symptoms and general obstetric precautions including but not limited to vaginal bleeding, contractions, leaking of fluid and fetal movement were reviewed in detail with the patient.  All questions were answered. Does have home bp cuff. Office bp cuff given: not applicable. Check bp daily, let us know if consistently >140 and/or >90.  Follow-up: No follow-ups on file.   Future Appointments  Date Time Provider [place]  06/24/2022 10:10 AM  [facility] NURSE [facility]  06/28/2022  8:30 AM [place] - [facility] [name] [facility] None  06/28/2022  9:30 AM Roma Schanz, CNM [facility]  06/29/2022  7:00 AM [facility] SCHED ROOM [facility] None  07/01/2022 10:10 AM [facility] NURSE [facility]  07/05/2022  8:30 AM [facility] [name] [facility] None  07/05/2022  9:30 AM Cresenzo-Dishmon, Joaquim Lai, CNM [facility]  07/08/2022 10:30 AM [facility] NURSE [facility]    No orders of the defined types were placed in this encounter.  Christin Fudge , DNP, [facility] 06/21/2022 9:56 AM

## 2022-06-21 NOTE — Progress Notes (Signed)
[name] 37 wks,cephalic,BPP 0/9,UKRCVKFM UAD with EDF,RI.64,.68,.70=90%,AFI 26 cm,polyhydramnios,FHR 148 bpm,anterior placenta gr 2

## 2022-06-22 ENCOUNTER — Telehealth (HOSPITAL_COMMUNITY): Payer: Self-pay | Admitting: *Deleted

## 2022-06-22 ENCOUNTER — Encounter (HOSPITAL_COMMUNITY): Payer: Self-pay | Admitting: *Deleted

## 2022-06-22 NOTE — Telephone Encounter (Signed)
Preadmission screen  

## 2022-06-24 ENCOUNTER — Encounter (HOSPITAL_COMMUNITY): Payer: Self-pay | Admitting: Obstetrics and Gynecology

## 2022-06-24 ENCOUNTER — Ambulatory Visit (INDEPENDENT_AMBULATORY_CARE_PROVIDER_SITE_OTHER): Payer: Medicaid Other | Admitting: *Deleted

## 2022-06-24 ENCOUNTER — Inpatient Hospital Stay (HOSPITAL_COMMUNITY)
Admission: AD | Admit: 2022-06-24 | Discharge: 2022-06-25 | Disposition: A | Discharge status: Home / Self Care | Payer: Medicaid Other | Attending: Obstetrics and Gynecology | Admitting: Obstetrics and Gynecology

## 2022-06-24 VITALS — BP 124/78 | HR 91 | Wt 337.2 lb

## 2022-06-24 DIAGNOSIS — K224 Dyskinesia of esophagus: Secondary | ICD-10-CM | POA: Diagnosis not present

## 2022-06-24 DIAGNOSIS — Z7982 Long term (current) use of aspirin: Secondary | ICD-10-CM | POA: Insufficient documentation

## 2022-06-24 DIAGNOSIS — R1030 Lower abdominal pain, unspecified: Secondary | ICD-10-CM | POA: Diagnosis present

## 2022-06-24 DIAGNOSIS — Z87891 Personal history of nicotine dependence: Secondary | ICD-10-CM | POA: Diagnosis not present

## 2022-06-24 DIAGNOSIS — Z3A37 37 weeks gestation of pregnancy: Secondary | ICD-10-CM | POA: Insufficient documentation

## 2022-06-24 DIAGNOSIS — O0993 Supervision of high risk pregnancy, unspecified, third trimester: Secondary | ICD-10-CM

## 2022-06-24 DIAGNOSIS — O10919 Unspecified pre-existing hypertension complicating pregnancy, unspecified trimester: Secondary | ICD-10-CM | POA: Diagnosis not present

## 2022-06-24 DIAGNOSIS — M546 Pain in thoracic spine: Secondary | ICD-10-CM | POA: Insufficient documentation

## 2022-06-24 DIAGNOSIS — O26893 Other specified pregnancy related conditions, third trimester: Secondary | ICD-10-CM | POA: Diagnosis not present

## 2022-06-24 DIAGNOSIS — O409XX0 Polyhydramnios, unspecified trimester, not applicable or unspecified: Secondary | ICD-10-CM

## 2022-06-24 MED ORDER — LIDOCAINE VISCOUS HCL 2 % MT SOLN: 5.0000 mL | OROMUCOSAL | 0 refills | Status: DC | PRN

## 2022-06-24 NOTE — Progress Notes (Signed)
   NURSE VISIT- NST  SUBJECTIVE:  Stefanie Farmer is a 26 y.o. G2P0010 female at [redacted]w[redacted]d, here for a NST for pregnancy complicated by [facility] and Polyhydramnios.  She reports active fetal movement, contractions: none, vaginal bleeding: none, membranes: intact.   OBJECTIVE:  BP 124/78   Pulse 91   Wt (!) 337 lb 3.2 oz (153 kg)   LMP 10/05/2021   BMI 59.73 kg/m   Appears well, no apparent distress  No results found for this or any previous visit (from the past 24 hour(s)).  NST: FHR baseline 140 bpm, Variability: moderate, Accelerations:present, Decelerations:  Absent= Cat 1/reactive Toco: none   ASSESSMENT: G2P0010 at [redacted]w[redacted]d with CHTN and Polyhydramnios NST reactive  PLAN: EFM strip reviewed by Dr. Elonda Husky   Recommendations: keep next appointment as scheduled    Alice Rieger  06/24/2022 12:32 PM

## 2022-06-24 NOTE — MAU Provider Note (Signed)
None     Chief Complaint: upper back pain and lower abdominal pain.    Stefanie Farmer is  27 y.o. G2P0010 at [redacted]w[redacted]d presents complaining of an hour long epidsode of upper back (between shoulder blades) and upper abdominal pain that resolved en route.  She has had this same pain 5-6 times during this pregnancy, thought to be esophageal spasms.  Gallbladder US last month was negative. Pt took Mylanta and prilosec tonight.   Obstetrical/Gynecological History: OB History     Gravida  2   Para      Term      Preterm      AB  1   Living  0      SAB  1   IAB      Ectopic      Multiple      Live Births  0          Past Medical History: Past Medical History:  Diagnosis Date   Hypertension    Kidney stone     Past Surgical History: Past Surgical History:  Procedure Laterality Date   CYSTOSCOPY W/ URETERAL STENT PLACEMENT Right 07/23/2021   Procedure: CYSTOSCOPY WITH RETROGRADE PYELOGRAM/URETERAL STENT PLACEMENT;  Surgeon: Cleon Gustin, MD;  Location: [facility];  Service: Urology;  Laterality: Right;   CYSTOSCOPY W/ URETERAL STENT PLACEMENT Right 08/13/2021   Procedure: CYSTOSCOPY WITH RETROGRADE PYELOGRAM/URETERAL STENT EXCHANGE;  Surgeon: Cleon Gustin, MD;  Location: [facility];  Service: Urology;  Laterality: Right;   EYE MUSCLE SURGERY     FOOT SURGERY     HOLMIUM LASER APPLICATION Right 3/76/2831   Procedure: HOLMIUM LASER APPLICATION;  Surgeon: Cleon Gustin, MD;  Location: [facility];  Service: Urology;  Laterality: Right;   ORIF ANKLE FRACTURE Right 06/24/2020   Procedure: OPEN REDUCTION INTERNAL FIXATION (ORIF) ANKLE FRACTURE;  Surgeon: Carole Civil, MD;  Location: [facility];  Service: Orthopedics;  Laterality: Right;    Family History: Family History  Problem Relation Age of Onset   Osteoporosis Mother    Fibromyalgia Mother    Hypertension Father    Diabetes Maternal Grandmother    Crohn's disease Maternal Grandmother    Diabetes  Maternal Grandfather    COPD Maternal Grandfather    Diabetes Paternal Grandmother     Social History: Social History   Tobacco Use   Smoking status: Former    Types: Cigarettes   Smokeless tobacco: Never  Vaping Use   Vaping Use: Every day   Substances: Nicotine, Flavoring  Substance Use Topics   Alcohol use: Never   Drug use: Not Currently    Frequency: 7.0 times per week    Types: Marijuana    Comment: last used June 2023    Allergies: No Known Allergies  Meds:  Medications Prior to Admission  Medication Sig Dispense Refill Last Dose   Acetaminophen (TYLENOL PO) Take by mouth. (Patient not taking: Reported on 06/21/2022)      aspirin 81 MG chewable tablet Chew 2 tablets (162 mg total) by mouth daily. 60 tablet 7    Blood Pressure Monitoring (BLOOD PRESSURE CUFF) MISC 1 kit by Does not apply route as directed. 1 each 0    Ferric Maltol (ACCRUFER) 30 MG CAPS Take 1 tablet by mouth 2 (two) times daily. On an empty stomach 60 capsule 11    omeprazole (PRILOSEC OTC) 20 MG tablet Take 20 mg by mouth daily.      Prenatal Vit-Fe Fumarate-FA (PRENATAL VITAMINS) 28-0.8 MG  TABS Take by mouth.       Review of Systems   Constitutional: Negative for fever and chills Eyes: Negative for visual disturbances Respiratory: Negative for shortness of breath, dyspnea Cardiovascular: Negative for chest pain or palpitations  Gastrointestinal: Negative for vomiting, diarrhea and constipation Genitourinary: Negative for dysuria and urgency Musculoskeletal: Negative for back pain, joint pain, myalgias.  Normal ROM  Neurological: Negative for dizziness and headaches    Physical Exam  Blood pressure 112/72, pulse 97, temperature 98 F (36.7 C), temperature source Oral, resp. rate 18, height 5\' 3"  (1.6 m), weight (!) 154.1 kg, last menstrual period 10/05/2021, SpO2 100 %. GENERAL: Well-developed, well-nourished female in no acute distress.  LUNGS: Normal respiratory effort HEART: Regular  rate and rhythm. ABDOMEN: Soft, nontender, nondistended, gravid.  EXTREMITIES: Nontender, no edema, 2+ distal pulses. DTR's 2+ NST: FHR baseline 145 bpm, Variability: moderate, Accelerations:present, Decelerations:  Absent= Cat 1/Reactive    Labs: No results found for this or any previous visit (from the past 24 hour(s)).  Assessment: Stefanie Farmer is  27 y.o. G2P0010 at [redacted]w[redacted]d presents with probable esophageal spasm.  Plan: Add viscous lidocaine to regimen prn   Christin Fudge 1/25/202411:10 PM

## 2022-06-24 NOTE — MAU Note (Signed)
..  Stefanie Farmer is a 27 y.o. at [redacted]w[redacted]d here in [facility]U reporting: upper back pain that is sharp and constant  and abdominal pain that was constant tightening and felt sharp when she would breathe in.  Describes the pain as episodes and says she has had this pain before Reports the pain has eased up since she got here.  Took prilosec and mylanta and neither helped. Denies vaginal bleeding or leaking of fluid.    Pain score: back pain 2/10; abdominal pain 2/10 Vitals:   06/24/22 2303  BP: 112/72  Pulse: 97  Resp: 18  Temp: 98 F (36.7 C)  SpO2: 100%     FHT:157

## 2022-06-25 ENCOUNTER — Other Ambulatory Visit: Payer: Self-pay | Admitting: Obstetrics & Gynecology

## 2022-06-25 ENCOUNTER — Other Ambulatory Visit: Payer: Self-pay | Admitting: Advanced Practice Midwife

## 2022-06-25 DIAGNOSIS — O403XX0 Polyhydramnios, third trimester, not applicable or unspecified: Secondary | ICD-10-CM

## 2022-06-25 DIAGNOSIS — O10913 Unspecified pre-existing hypertension complicating pregnancy, third trimester: Secondary | ICD-10-CM

## 2022-06-28 ENCOUNTER — Encounter: Payer: Self-pay | Admitting: Women's Health

## 2022-06-28 ENCOUNTER — Ambulatory Visit (INDEPENDENT_AMBULATORY_CARE_PROVIDER_SITE_OTHER): Payer: Medicaid Other | Admitting: Women's Health

## 2022-06-28 ENCOUNTER — Ambulatory Visit (INDEPENDENT_AMBULATORY_CARE_PROVIDER_SITE_OTHER): Payer: Medicaid Other

## 2022-06-28 VITALS — BP 142/85 | HR 92 | Wt 337.0 lb

## 2022-06-28 DIAGNOSIS — Z3A38 38 weeks gestation of pregnancy: Secondary | ICD-10-CM

## 2022-06-28 DIAGNOSIS — O409XX0 Polyhydramnios, unspecified trimester, not applicable or unspecified: Secondary | ICD-10-CM

## 2022-06-28 DIAGNOSIS — O0993 Supervision of high risk pregnancy, unspecified, third trimester: Secondary | ICD-10-CM

## 2022-06-28 DIAGNOSIS — O10913 Unspecified pre-existing hypertension complicating pregnancy, third trimester: Secondary | ICD-10-CM | POA: Diagnosis not present

## 2022-06-28 DIAGNOSIS — O403XX0 Polyhydramnios, third trimester, not applicable or unspecified: Secondary | ICD-10-CM

## 2022-06-28 DIAGNOSIS — O10919 Unspecified pre-existing hypertension complicating pregnancy, unspecified trimester: Secondary | ICD-10-CM

## 2022-06-28 LAB — POCT URINALYSIS DIPSTICK OB
Glucose, UA: NEGATIVE
Ketones, UA: NEGATIVE
Leukocytes, UA: NEGATIVE
Nitrite, UA: NEGATIVE

## 2022-06-28 NOTE — Progress Notes (Signed)
[facility] PREGNANCY VISIT Patient name: Stefanie Farmer MRN [id number]  Date of birth: 07/26/1995 Chief Complaint:   Routine Prenatal Visit  History of Present Illness:   Charmain Diosdado is a 27 y.o. G9P0010 female at [redacted]w[redacted]d with an Estimated Date of Delivery: 07/12/22 being seen today for ongoing management of a high-risk pregnancy complicated by [facility] vs GHTN, mild polyhydramnios, BMI 59.    Today she reports no complaints. Did not get bp cuff from her grandparents. Denies ha, visual changes, ruq/epigastric pain, n/v.   Contractions: Not present. Vag. Bleeding: None.  Movement: Present. denies leaking of fluid.      06/14/2022   10:13 AM 01/01/2022   10:49 AM 09/15/2021   11:14 AM  Depression screen PHQ 2/9  Decreased Interest 0 0 0  Down, Depressed, Hopeless 0 0 0  PHQ - 2 Score 0 0 0  Altered sleeping 1 2 1   Tired, decreased energy 1 2 0  Change in appetite 0 2 1  Feeling bad or failure about yourself  0 0 0  Trouble concentrating 0 0 0  Moving slowly or fidgety/restless 0 0 0  Suicidal thoughts 0 0 0  PHQ-9 Score 2 6 2   Difficult doing work/chores Not difficult at all          06/14/2022   10:15 AM 01/01/2022   10:49 AM 09/15/2021   11:14 AM  GAD 7 : Generalized Anxiety Score  Nervous, Anxious, on Edge 1 0 1  Control/stop worrying 0 0 0  Worry too much - different things 0 0 0  Trouble relaxing 0 0 0  Restless 0 0 0  Easily annoyed or irritable 1 0 0  Afraid - awful might happen 0 0 0  Total GAD 7 Score 2 0 1  Anxiety Difficulty Not difficult at all       Review of Systems:   Pertinent items are noted in HPI Denies abnormal vaginal discharge w/ itching/odor/irritation, headaches, visual changes, shortness of breath, chest pain, abdominal pain, severe nausea/vomiting, or problems with urination or bowel movements unless otherwise stated above. Pertinent History Reviewed:  Reviewed past medical,surgical, social, obstetrical and family history.  Reviewed problem list,  medications and allergies. Physical Assessment:   Vitals:   06/28/22 0925 06/28/22 0931  BP: (!) 140/89 (!) 142/85  Pulse: 96 92  Weight: (!) 337 lb (152.9 kg)   Body mass index is 59.7 kg/m.           Physical Examination:   General appearance: alert, well appearing, and in no distress  Mental status: alert, oriented to person, place, and time  Skin: warm & dry   Extremities: Edema: None    Cardiovascular: normal heart rate noted  Respiratory: normal respiratory effort, no distress  Abdomen: gravid, soft, non-tender  Pelvic: Cervical exam deferred         Fetal Status: Fetal Heart Rate (bpm): 150 u/s   Movement: Present    Fetal Surveillance Testing today: [name] 38 wks,cephalic,BPP 4/9,SWH 26 cm,polyhydramnios,anterior placenta gr 3,FHR 150 bpm,RI .64,.64,.74=92%   Chaperone: N/A    Results for orders placed or performed in visit on 06/28/22 (from the past 24 hour(s))  POC Urinalysis Dipstick OB   Collection Time: 06/28/22 10:17 AM  Result Value Ref Range   Color, UA     Clarity, UA     Glucose, UA Negative Negative   Bilirubin, UA     Ketones, UA neg    Spec Grav, UA     Blood,  UA trace    pH, UA     POC,PROTEIN,UA Trace Negative, Trace, Small (1+), Moderate (2+), Large (3+), 4+   Urobilinogen, UA     Nitrite, UA neg    Leukocytes, UA Negative Negative   Appearance     Odor      Assessment & Plan:  High-risk pregnancy: G2P0010 at [redacted]w[redacted]d with an Estimated Date of Delivery: 07/12/22   1) CHTN vs GHTN, no meds, asymptomatic, IOL as scheduled tomorrow. Reviewed in detail  2) Mild polyhydramnios, stable, AFI 26cm today  3) BMI 59  Meds: No orders of the defined types were placed in this encounter.   Labs/procedures today: U/S  Treatment Plan:  IOL as scheduled tomorrow  Reviewed: Term labor symptoms and general obstetric precautions including but not limited to vaginal bleeding, contractions, leaking of fluid and fetal movement were reviewed in detail with the  patient.  All questions were answered.  Follow-up: Return for 1wk bp check w/ nurse.   Future Appointments  Date Time Provider [place]  06/29/2022  7:00 AM [facility] [place] [facility] None  07/13/2022  2:30 PM [facility] NURSE [facility]    Orders Placed This Encounter  Procedures   POC Urinalysis Dipstick OB   Roma Schanz CNM, [facility] 06/28/2022 10:36 AM

## 2022-06-28 NOTE — Progress Notes (Signed)
[name] 38 wks,cephalic,BPP 5/2,CEY 26 cm,polyhydramnios,anterior placenta gr 3,FHR 150 bpm,RI .64,.64,.74=92%

## 2022-06-28 NOTE — Patient Instructions (Signed)
Stefanie Farmer, thank you for choosing our office today! We appreciate the opportunity to meet your healthcare needs. You may receive a short survey by mail, e-mail, or through [employer]. If you are happy with your care we would appreciate if you could take just a few minutes to complete the survey questions. We read all of your comments and take your feedback very seriously. Thank you again for choosing our office.  Center [facility] [employer] Team at [place] [facility] ([place], [place] [address]) Entrance C, located off of [address] parking   CLASSES: Go to [email or web address] to register for classes (childbirth, breastfeeding, waterbirth, infant CPR, daddy bootcamp, etc.)  Call the office [phone]) or go to [facility] if: You begin to have strong, frequent contractions Your water breaks.  Sometimes it is a big gush of fluid, sometimes it is just a trickle that keeps getting your panties wet or running down your legs You have vaginal bleeding.  It is normal to have a small amount of spotting if your cervix was checked.  You don't feel your baby moving like normal.  If you don't, get you something to eat and drink and lay down and focus on feeling your baby move.   If your baby is still not moving like normal, you should call the office or go to [facility].  Call the office [phone]) or go to [facility] hospital for these signs of pre-eclampsia: Severe headache that does not go away with Tylenol Visual changes- seeing spots, double, blurred vision Pain under your right breast or upper abdomen that does not go away with Tums or heartburn medicine Nausea and/or vomiting Severe swelling in your hands, feet, and face   [facility] Pediatricians/Family Doctors [facility]): [address] [place], [place]: [address] [place], [place] [facility]): [place], [phone] (call to ask if accepting patients) [facility] Department: [address] [place], [facility]): [address], [address], [place] [facility]: [address] [place], [place] [facility] [place]: [employer], [place] [facility]): [phone] [facility]: [address], [place]: [address] [place] [facility]: [phone], [phone]  Home Blood Pressure Monitoring for Patients   Your provider has recommended that you check your blood pressure (BP) at least once a week at home. If you do not have a blood pressure cuff at home, one will be provided for you. Contact your provider if you have not received your monitor within 1 week.   Helpful Tips for Accurate Home Blood Pressure Checks  Don't smoke, exercise, or drink caffeine 30 minutes before checking your BP Use the restroom before checking your BP (a full bladder can raise your pressure) Relax in a comfortable upright chair Feet on the ground Left arm resting comfortably on a flat surface at the level of your heart Legs uncrossed Back supported Sit quietly and don't talk Place the cuff on your bare arm Adjust snuggly, so that only two fingertips  can fit between your skin and the top of the cuff Check 2 readings separated by at least one minute Keep a log of your BP readings For a visual, please reference this diagram: http://ccnc.care/bpdiagram  Provider Name: [facility]     Phone: [phone]  Zone 1: ALL CLEAR  Continue to monitor your symptoms:  BP reading is less than 140 (top number) or less than 90 (bottom number)  No right  upper stomach pain No headaches or seeing spots No feeling nauseated or throwing up No swelling in face and hands  Zone 2: CAUTION Call your doctor's office for any of the following:  BP reading is greater than 140 (top number) or greater than 90 (bottom number)  Stomach pain under your ribs in the middle or right side Headaches or seeing spots Feeling nauseated or throwing up Swelling in face and hands  Zone 3: EMERGENCY  Seek immediate medical care if you have any of the following:  BP reading is greater than160 (top number) or greater than 110 (bottom number) Severe headaches not improving with Tylenol Serious difficulty catching your breath Any worsening symptoms from Zone 2   Braxton Hicks Contractions Contractions of the uterus can occur throughout pregnancy, but they are not always a sign that you are in labor. You may have practice contractions called Braxton Hicks contractions. These false labor contractions are sometimes confused with true labor. What are [place] contractions? Braxton Hicks contractions are tightening movements that occur in the muscles of the uterus before labor. Unlike true labor contractions, these contractions do not result in opening (dilation) and thinning of the cervix. Toward the end of pregnancy (32-34 weeks), Braxton Hicks contractions can happen more often and may become stronger. These contractions are sometimes difficult to tell apart from true labor because they can be very uncomfortable. You should not feel embarrassed if you go to the hospital with false labor. Sometimes, the only way to tell if you are in true labor is for your health care provider to look for changes in the cervix. The health care provider will do a physical exam and may monitor your contractions. If you are not in true labor, the exam should show that your cervix is not dilating and your water has not broken. If there are no other health problems associated with your  pregnancy, it is completely safe for you to be sent home with false labor. You may continue to have Braxton Hicks contractions until you go into true labor. How to tell the difference between true labor and false labor True labor Contractions last 30-70 seconds. Contractions become very regular. Discomfort is usually felt in the top of the uterus, and it spreads to the lower abdomen and low back. Contractions do not go away with walking. Contractions usually become more intense and increase in frequency. The cervix dilates and gets thinner. False labor Contractions are usually shorter and not as strong as true labor contractions. Contractions are usually irregular. Contractions are often felt in the front of the lower abdomen and in the groin. Contractions may go away when you walk around or change positions while lying down. Contractions get weaker and are shorter-lasting as time goes on. The cervix usually does not dilate or become thin. Follow these instructions at home:  Take over-the-counter and prescription medicines only as told by your health care provider. Keep up with your usual exercises and follow other instructions from your health care provider. Eat and drink lightly if you think  you are going into labor. If Braxton Hicks contractions are making you uncomfortable: Change your position from lying down or resting to walking, or change from walking to resting. Sit and rest in a tub of warm water. Drink enough fluid to keep your urine pale yellow. Dehydration may cause these contractions. Do slow and deep breathing several times an hour. Keep all follow-up prenatal visits as told by your health care provider. This is important. Contact a health care provider if: You have a fever. You have continuous pain in your abdomen. Get help right away if: Your contractions become stronger, more regular, and closer together. You have fluid leaking or gushing from your vagina. You pass  blood-tinged mucus (bloody show). You have bleeding from your vagina. You have low back pain that you never had before. You feel your baby's head pushing down and causing pelvic pressure. Your baby is not moving inside you as much as it used to. Summary Contractions that occur before labor are called Braxton Hicks contractions, false labor, or practice contractions. Braxton Hicks contractions are usually shorter, weaker, farther apart, and less regular than true labor contractions. True labor contractions usually become progressively stronger and regular, and they become more frequent. Manage discomfort from [facility] contractions by changing position, resting in a warm bath, drinking plenty of water, or practicing deep breathing. This information is not intended to replace advice given to you by your health care provider. Make sure you discuss any questions you have with your health care provider. Document Revised: 04/29/2017 Document Reviewed: 09/30/2016 Elsevier Patient Education  [place].

## 2022-06-29 ENCOUNTER — Inpatient Hospital Stay (HOSPITAL_COMMUNITY)
Admission: RE | Admit: 2022-06-29 | Discharge: 2022-07-02 | DRG: 806 | Disposition: A | Discharge status: Home / Self Care | Payer: Medicaid Other | Attending: Obstetrics and Gynecology | Admitting: Obstetrics and Gynecology

## 2022-06-29 ENCOUNTER — Other Ambulatory Visit: Payer: Self-pay

## 2022-06-29 ENCOUNTER — Encounter (HOSPITAL_COMMUNITY): Payer: Self-pay | Admitting: Family Medicine

## 2022-06-29 ENCOUNTER — Inpatient Hospital Stay (HOSPITAL_COMMUNITY): Payer: Medicaid Other

## 2022-06-29 DIAGNOSIS — K828 Other specified diseases of gallbladder: Secondary | ICD-10-CM | POA: Diagnosis present

## 2022-06-29 DIAGNOSIS — Z87442 Personal history of urinary calculi: Secondary | ICD-10-CM

## 2022-06-29 DIAGNOSIS — Z6841 Body Mass Index (BMI) 40.0 and over, adult: Secondary | ICD-10-CM

## 2022-06-29 DIAGNOSIS — O10919 Unspecified pre-existing hypertension complicating pregnancy, unspecified trimester: Secondary | ICD-10-CM | POA: Diagnosis present

## 2022-06-29 DIAGNOSIS — R1013 Epigastric pain: Secondary | ICD-10-CM | POA: Diagnosis not present

## 2022-06-29 DIAGNOSIS — O99893 Other specified diseases and conditions complicating puerperium: Secondary | ICD-10-CM | POA: Diagnosis not present

## 2022-06-29 DIAGNOSIS — O1002 Pre-existing essential hypertension complicating childbirth: Secondary | ICD-10-CM | POA: Diagnosis present

## 2022-06-29 DIAGNOSIS — M549 Dorsalgia, unspecified: Secondary | ICD-10-CM | POA: Diagnosis not present

## 2022-06-29 DIAGNOSIS — O134 Gestational [pregnancy-induced] hypertension without significant proteinuria, complicating childbirth: Secondary | ICD-10-CM | POA: Diagnosis present

## 2022-06-29 DIAGNOSIS — O99214 Obesity complicating childbirth: Secondary | ICD-10-CM | POA: Diagnosis present

## 2022-06-29 DIAGNOSIS — Z3A38 38 weeks gestation of pregnancy: Secondary | ICD-10-CM

## 2022-06-29 DIAGNOSIS — O409XX0 Polyhydramnios, unspecified trimester, not applicable or unspecified: Secondary | ICD-10-CM | POA: Diagnosis present

## 2022-06-29 DIAGNOSIS — O0993 Supervision of high risk pregnancy, unspecified, third trimester: Principal | ICD-10-CM

## 2022-06-29 DIAGNOSIS — O2662 Liver and biliary tract disorders in childbirth: Secondary | ICD-10-CM | POA: Diagnosis present

## 2022-06-29 DIAGNOSIS — R110 Nausea: Secondary | ICD-10-CM

## 2022-06-29 DIAGNOSIS — O403XX0 Polyhydramnios, third trimester, not applicable or unspecified: Secondary | ICD-10-CM | POA: Diagnosis present

## 2022-06-29 DIAGNOSIS — Z87891 Personal history of nicotine dependence: Secondary | ICD-10-CM | POA: Diagnosis not present

## 2022-06-29 DIAGNOSIS — O0990 Supervision of high risk pregnancy, unspecified, unspecified trimester: Secondary | ICD-10-CM

## 2022-06-29 LAB — CBC
HCT: 31 % — ABNORMAL LOW (ref 36.0–46.0)
Hemoglobin: 10.4 g/dL — ABNORMAL LOW (ref 12.0–15.0)
MCH: 29.9 pg (ref 26.0–34.0)
MCHC: 33.5 g/dL (ref 30.0–36.0)
MCV: 89.1 fL (ref 80.0–100.0)
Platelets: 290 10*3/uL (ref 150–400)
RBC: 3.48 MIL/uL — ABNORMAL LOW (ref 3.87–5.11)
RDW: 13.9 % (ref 11.5–15.5)
WBC: 10.8 10*3/uL — ABNORMAL HIGH (ref 4.0–10.5)
nRBC: 0 % (ref 0.0–0.2)

## 2022-06-29 LAB — COMPREHENSIVE METABOLIC PANEL
ALT: 20 U/L (ref 0–44)
AST: 21 U/L (ref 15–41)
Albumin: 2.5 g/dL — ABNORMAL LOW (ref 3.5–5.0)
Alkaline Phosphatase: 112 U/L (ref 38–126)
Anion gap: 10 (ref 5–15)
BUN: <5 mg/dL — ABNORMAL LOW (ref 6–20)
CO2: 22 mmol/L (ref 22–32)
Calcium: 8.6 mg/dL — ABNORMAL LOW (ref 8.9–10.3)
Chloride: 101 mmol/L (ref 98–111)
Creatinine, Ser: 0.52 mg/dL (ref 0.44–1.00)
GFR, Estimated: >60 mL/min (ref >60)
Glucose, Bld: 84 mg/dL (ref 70–99)
Potassium: 3.7 mmol/L (ref 3.5–5.1)
Sodium: 133 mmol/L — ABNORMAL LOW (ref 135–145)
Total Bilirubin: 0.2 mg/dL — ABNORMAL LOW (ref 0.3–1.2)
Total Protein: 5.7 g/dL — ABNORMAL LOW (ref 6.5–8.1)

## 2022-06-29 LAB — TYPE AND SCREEN
ABO/RH(D): A POS
Antibody Screen: NEGATIVE

## 2022-06-29 LAB — PROTEIN / CREATININE RATIO, URINE
Creatinine, Urine: 202 mg/dL
Protein Creatinine Ratio: 0.05 mg/mg{Cre} (ref 0.00–0.15)
Total Protein, Urine: 10 mg/dL

## 2022-06-29 LAB — ABO/RH: ABO/RH(D): A POS

## 2022-06-29 MED ORDER — DIPHENHYDRAMINE HCL 50 MG/ML IJ SOLN: 12.5000 mg | INTRAMUSCULAR | Status: DC | PRN

## 2022-06-29 MED ORDER — LIDOCAINE HCL (PF) 1 % IJ SOLN: 30.0000 mL | INTRAMUSCULAR | Status: DC | PRN

## 2022-06-29 MED ORDER — OXYTOCIN-SODIUM CHLORIDE 30-0.9 UT/500ML-% IV SOLN
2.5000 [IU]/h | INTRAVENOUS | Status: DC
  Administered 2022-07-01: 2.5 [IU]/h via INTRAVENOUS

## 2022-06-29 MED ORDER — OXYCODONE-ACETAMINOPHEN 5-325 MG PO TABS: 2.0000 | ORAL_TABLET | ORAL | Status: DC | PRN

## 2022-06-29 MED ORDER — OXYTOCIN BOLUS FROM INFUSION
333.0000 mL | Freq: Once | INTRAVENOUS | Status: AC
  Administered 2022-07-01: 333 mL via INTRAVENOUS

## 2022-06-29 MED ORDER — PHENYLEPHRINE 80 MCG/ML (10ML) SYRINGE FOR IV PUSH (FOR BLOOD PRESSURE SUPPORT): 80.0000 ug | PREFILLED_SYRINGE | INTRAVENOUS | Status: DC | PRN

## 2022-06-29 MED ORDER — MISOPROSTOL 25 MCG QUARTER TABLET
25.0000 ug | ORAL_TABLET | Freq: Once | ORAL | Status: AC
  Administered 2022-06-29: 25 ug via VAGINAL
  Filled 2022-06-29: qty 1

## 2022-06-29 MED ORDER — LACTATED RINGERS IV SOLN
500.0000 mL | Freq: Once | INTRAVENOUS | Status: AC
  Administered 2022-06-30: 500 mL via INTRAVENOUS

## 2022-06-29 MED ORDER — FENTANYL CITRATE (PF) 100 MCG/2ML IJ SOLN
100.0000 ug | INTRAMUSCULAR | Status: DC | PRN
  Administered 2022-06-29 – 2022-06-30 (×3): 100 ug via INTRAVENOUS
  Filled 2022-06-29 (×3): qty 2

## 2022-06-29 MED ORDER — HYDROXYZINE HCL 50 MG PO TABS: 50.0000 mg | ORAL_TABLET | Freq: Four times a day (QID) | ORAL | Status: DC | PRN

## 2022-06-29 MED ORDER — EPHEDRINE 5 MG/ML INJ: 10.0000 mg | INTRAVENOUS | Status: DC | PRN

## 2022-06-29 MED ORDER — TERBUTALINE SULFATE 1 MG/ML IJ SOLN: 0.2500 mg | Freq: Once | INTRAMUSCULAR | Status: DC | PRN

## 2022-06-29 MED ORDER — OXYTOCIN-SODIUM CHLORIDE 30-0.9 UT/500ML-% IV SOLN
1.0000 m[IU]/min | INTRAVENOUS | Status: DC
  Administered 2022-06-29: 2 m[IU]/min via INTRAVENOUS
  Administered 2022-06-30: 4 m[IU]/min via INTRAVENOUS
  Filled 2022-06-29 (×2): qty 500

## 2022-06-29 MED ORDER — OXYCODONE-ACETAMINOPHEN 5-325 MG PO TABS: 1.0000 | ORAL_TABLET | ORAL | Status: DC | PRN

## 2022-06-29 MED ORDER — FLEET ENEMA 7-19 GM/118ML RE ENEM: 1.0000 | ENEMA | RECTAL | Status: DC | PRN

## 2022-06-29 MED ORDER — ACETAMINOPHEN 325 MG PO TABS: 650.0000 mg | ORAL_TABLET | ORAL | Status: DC | PRN

## 2022-06-29 MED ORDER — LACTATED RINGERS IV SOLN: INTRAVENOUS | Status: DC

## 2022-06-29 MED ORDER — ONDANSETRON HCL 4 MG/2ML IJ SOLN
4.0000 mg | Freq: Four times a day (QID) | INTRAMUSCULAR | Status: DC | PRN
  Administered 2022-06-29: 4 mg via INTRAVENOUS
  Filled 2022-06-29: qty 2

## 2022-06-29 MED ORDER — MISOPROSTOL 25 MCG QUARTER TABLET
25.0000 ug | ORAL_TABLET | ORAL | Status: DC | PRN
  Administered 2022-06-29: 25 ug via ORAL
  Filled 2022-06-29: qty 1

## 2022-06-29 MED ORDER — MISOPROSTOL 50MCG HALF TABLET
50.0000 ug | ORAL_TABLET | Freq: Once | ORAL | Status: AC
  Administered 2022-06-29: 50 ug via ORAL
  Filled 2022-06-29: qty 1

## 2022-06-29 MED ORDER — SOD CITRATE-CITRIC ACID 500-334 MG/5ML PO SOLN: 30.0000 mL | ORAL | Status: DC | PRN

## 2022-06-29 MED ORDER — FENTANYL-BUPIVACAINE-NACL 0.5-0.125-0.9 MG/250ML-% EP SOLN
12.0000 mL/h | EPIDURAL | Status: DC | PRN
  Administered 2022-06-30 (×2): 12 mL/h via EPIDURAL
  Filled 2022-06-29 (×2): qty 250

## 2022-06-29 MED ORDER — LACTATED RINGERS IV SOLN: 500.0000 mL | INTRAVENOUS | Status: DC | PRN

## 2022-06-29 NOTE — Progress Notes (Signed)
Patient ID: Stefanie Farmer, female   DOB: 07-30-1995, 27 y.o.   MRN: [id number] Doing well  Vitals:   06/29/22 1753 06/29/22 1902 06/29/22 1925 06/29/22 2210  BP: (!) 120/49 128/64 122/88 110/73  Pulse: 83 78 89 92  Resp:   19   Temp:   97.9 F (36.6 C)   TempSrc:   Oral   Weight:      Height:       FHR stable UCs irregular  Dilation: 1.5 Effacement (%): 50 Station: -3 Presentation: Vertex Exam by:: Mercado-Ortiz  Foley sitting in vagina  Will start Pitocin by low dose protocol

## 2022-06-29 NOTE — Progress Notes (Signed)
Patient ID: Stefanie Farmer, female   DOB: 29-Jan-1996, 27 y.o.   MRN: [id number] Doing well  Vitals:   06/29/22 1753 06/29/22 1902 06/29/22 1925 06/29/22 2210  BP: (!) 120/49 128/64 122/88 110/73  Pulse: 83 78 89 92  Resp:   19   Temp:   97.9 F (36.6 C)   TempSrc:   Oral   Weight:      Height:       FHR reassuring UCs mostly regular  Last exam: Dilation: 1.5 Effacement (%): 50 Station: -3 Presentation: Vertex Exam by:: Mercado-Ortiz  Will continue to observe

## 2022-06-29 NOTE — Progress Notes (Signed)
Labor Progress Note  Stefanie Farmer is a 27 y.o. G2P0010 at [redacted]w[redacted]d presented for IOL 2/2 HTN  S: Pt doing well. Ready for FB  O:  BP (!) 120/49   Pulse 83   Temp 98 F (36.7 C) (Oral)   Ht 5\' 4"  (1.626 m)   Wt (!) 153.2 kg   LMP 10/05/2021   BMI 57.98 kg/m  EFM: 135bpm/Moderate variability/ 15x15 accels/ None decels  CVE: Dilation: 1.5 Effacement (%): 50 Station: -3 Presentation: Vertex Exam by:: Mercado-Ortiz   A&P: 27 y.o. G2P0010 [redacted]w[redacted]d  here for IOL as above  #Labor: Progressing well. FB placed, PO cytotec 2mcg #Pain: Family/Friend support and PO/IV pain meds #FWB: CAT 1 #GBS negative #cHTN v gHTN: PreE labs stable. BP well controlled. CTM  Shelda Pal, DO [place] Fellow, Faculty practice [place] [facility] 06/29/22  6:45 PM

## 2022-06-29 NOTE — Progress Notes (Signed)
Stefanie Farmer is a 27 y.o. G2P0010 at [redacted]w[redacted]d by LMP admitted for induction of labor due to [place].  Subjective: Pt is feeling fine overall. It took awhile and many tries for her IV to be placed.   Objective: BP 128/63   Pulse (!) 104   Temp 98 F (36.7 C) (Oral)   Ht 5\' 4"  (1.626 m)   Wt (!) 153.2 kg   LMP 10/05/2021   BMI 57.98 kg/m  No intake/output data recorded. No intake/output data recorded.  FHT:  FHR: 130 bpm, variability: moderate,  accelerations:  Present,  decelerations:  Absent UC:   none SVE:   Dilation: Fingertip Effacement (%): Thick Station: -3 Exam by:: Stefanie Daring, MD  Labs: Lab Results  Component Value Date   WBC 10.8 (H) 06/29/2022   HGB 10.4 (L) 06/29/2022   HCT 31.0 (L) 06/29/2022   MCV 89.1 06/29/2022   PLT 290 06/29/2022    Assessment / Plan: Induction of labor for gHTN vs cHTN.   Labor:  Pt given cytotecx2, will reassess in 4 hours to consider FB/AROM/pitocin Fetal Wellbeing:  Category I Pain Control:   IV pain medication and would like an epidural at some point I/D:   GBS- Anticipated MOD:  NSVD  gHTN vs cHTN: Will monitor Bps, have been appropriate thus far. Pre-E labs back and stable.   Stefanie Sander, MD 06/29/2022, 1:40 PM

## 2022-06-29 NOTE — H&P (Addendum)
OBSTETRIC ADMISSION HISTORY AND PHYSICAL  Stefanie Farmer is a 27 y.o. female G2P0010 with IUP at [redacted]w[redacted]d by LMP consistent with 12 wk [name] presenting for IOL for cHTN vs gHTN. She reports +FMs, No LOF, no VB, no blurry vision, headaches or peripheral edema, and RUQ pain.  She plans on bottle feeding. She is not interested in birth control at this time. She received her prenatal care at [facility]   Dating: By LMP consistent with 12 wk [name] --->  Estimated Date of Delivery: 07/12/22  [place]ono 06/14/22:    @[redacted]w[redacted]d , CWD, normal anatomy, cephalic presentation, 2440N, 35% EFW   Prenatal History/Complications: gHTN vs cHTN, mild polyhydramnios, BMI 59  Past Medical History: Past Medical History:  Diagnosis Date   Hypertension    Kidney stone     Past Surgical History: Past Surgical History:  Procedure Laterality Date   CYSTOSCOPY W/ URETERAL STENT PLACEMENT Right 07/23/2021   Procedure: CYSTOSCOPY WITH RETROGRADE PYELOGRAM/URETERAL STENT PLACEMENT;  Surgeon: Cleon Gustin, MD;  Location: [facility];  Service: Urology;  Laterality: Right;   CYSTOSCOPY W/ URETERAL STENT PLACEMENT Right 08/13/2021   Procedure: CYSTOSCOPY WITH RETROGRADE PYELOGRAM/URETERAL STENT EXCHANGE;  Surgeon: Cleon Gustin, MD;  Location: [facility];  Service: Urology;  Laterality: Right;   EYE MUSCLE SURGERY     FOOT SURGERY     HOLMIUM LASER APPLICATION Right 0/27/2536   Procedure: HOLMIUM LASER APPLICATION;  Surgeon: Cleon Gustin, MD;  Location: [facility];  Service: Urology;  Laterality: Right;   ORIF ANKLE FRACTURE Right 06/24/2020   Procedure: OPEN REDUCTION INTERNAL FIXATION (ORIF) ANKLE FRACTURE;  Surgeon: Carole Civil, MD;  Location: [facility];  Service: Orthopedics;  Laterality: Right;    Obstetrical History: OB History     Gravida  2   Para      Term      Preterm      AB  1   Living  0      SAB  1   IAB      Ectopic      Multiple      Live Births  0           Social  History Social History   Socioeconomic History   Marital status: Soil scientist    Spouse name: Not on file   Number of children: Not on file   Years of education: Not on file   Highest education level: Not on file  Occupational History   Not on file  Tobacco Use   Smoking status: Former    Types: Cigarettes   Smokeless tobacco: Never  Vaping Use   Vaping Use: Every day   Substances: Nicotine, Flavoring  Substance and Sexual Activity   Alcohol use: Never   Drug use: Not Currently    Frequency: 7.0 times per week    Types: Marijuana    Comment: last used June 2023   Sexual activity: Yes    Birth control/protection: None  Other Topics Concern   Not on file  Social History Narrative   Not on file   Social Determinants of Health   Financial Resource Strain: Low Risk  (01/01/2022)   Overall Financial Resource Strain (CARDIA)    Difficulty of Paying Living Expenses: Not very hard  Food Insecurity: No Food Insecurity (01/01/2022)   Hunger Vital Sign    Worried About Running Out of Food in the Last Year: Never true    Ran Out of Food in the Last Year: Never  true  Transportation Needs: No Transportation Needs (01/01/2022)   PRAPARE - Hydrologist (Medical): No    Lack of Transportation (Non-Medical): No  Physical Activity: Insufficiently Active (01/01/2022)   Exercise Vital Sign    Days of Exercise per Week: 1 day    Minutes of Exercise per Session: 10 min  Stress: No Stress Concern Present (01/01/2022)   [place]    Feeling of Stress : Only a little  Social Connections: Socially Integrated (01/01/2022)   Social Connection and Isolation Panel [NHANES]    Frequency of Communication with Friends and Family: More than three times a week    Frequency of Social Gatherings with Friends and Family: Three times a week    Attends Religious Services: More than 4 times per year    Active Member  of Clubs or Organizations: No    Attends Archivist Meetings: 1 to 4 times per year    Marital Status: Living with partner    Family History: Family History  Problem Relation Age of Onset   Osteoporosis Mother    Fibromyalgia Mother    Hypertension Father    Diabetes Maternal Grandmother    Crohn's disease Maternal Grandmother    Diabetes Maternal Grandfather    COPD Maternal Grandfather    Diabetes Paternal Grandmother     Allergies: No Known Allergies  Medications Prior to Admission  Medication Sig Dispense Refill Last Dose   Acetaminophen (TYLENOL PO) Take by mouth. (Patient not taking: Reported on 06/21/2022)      alum & mag hydroxide-simeth (MAALOX PLUS) 400-400-40 MG/5ML suspension Take 5 mLs by mouth every 6 (six) hours as needed for indigestion.      aspirin 81 MG chewable tablet Chew 2 tablets (162 mg total) by mouth daily. 60 tablet 7    Blood Pressure Monitoring (BLOOD PRESSURE CUFF) MISC 1 kit by Does not apply route as directed. 1 each 0    Ferric Maltol (ACCRUFER) 30 MG CAPS Take 1 tablet by mouth 2 (two) times daily. On an empty stomach 60 capsule 11    lidocaine (XYLOCAINE) 2 % solution Use as directed 5 mLs in the mouth or throat as needed for mouth pain. (Patient not taking: Reported on 06/28/2022) 100 mL 0    omeprazole (PRILOSEC OTC) 20 MG tablet Take 20 mg by mouth daily.      Prenatal Vit-Fe Fumarate-FA (PRENATAL VITAMINS) 28-0.8 MG TABS Take by mouth.        Review of Systems   All systems reviewed and negative except as stated in HPI  Height 5\' 4"  (1.626 m), weight (!) 153.2 kg, last menstrual period 10/05/2021. General appearance: alert, cooperative, and appears stated age Lungs: clear to auscultation bilaterally Heart: regular rate and rhythm Abdomen: soft, non-tender; bowel sounds normal Extremities: Homans sign is negative, no sign of DVT Presentation: cephalic Fetal monitoringBaseline: 120 bpm, Variability: Good {> 6 bpm),  Accelerations: Reactive, and Decelerations: Absent Uterine activity: irregular     Prenatal labs: ABO, Rh: A/Positive/-- (08/04 1159) Antibody: Negative (11/20 0832) Rubella: 1.13 (08/04 1159) RPR: Non Reactive (11/20 0832)  HBsAg: Negative (08/04 1159)  HIV: Non Reactive (11/20 0832)  GBS: Negative/-- (01/15 1215)  2 hr Glucola nml Genetic screening  nml Anatomy US nml  Prenatal Transfer Tool  Maternal Diabetes: No Genetic Screening: Normal Maternal Ultrasounds/Referrals: Other:mild polyhydraminos  Fetal Ultrasounds or other Referrals:  Other: cord dopplers 92% Maternal Substance Abuse:  No Significant Maternal Medications:  None Significant Maternal Lab Results:  Group B Strep negative Number of Prenatal Visits:greater than 3 verified prenatal visits Other Comments:  None  No results found for this or any previous visit (from the past 24 hour(s)).  Patient Active Problem List   Diagnosis Date Noted   Indication for care in labor or delivery 06/29/2022   Polyhydramnios affecting pregnancy 05/30/2022   High-risk pregnancy 01/01/2022   Chronic hypertension affecting pregnancy 09/15/2021   Kidney stone 73/41/9379   Complicated UTI (urinary tract infection) 07/22/2021   Morbid obesity with BMI of 45.0-49.9, adult ([place]) 07/22/2021   Closed displaced fracture of lateral malleolus of fibula with routine healing     Assessment/Plan:  Mariadelcarmen Corella is a 27 y.o. G2P0010 at [redacted]w[redacted]d here for IOL for gHTN vs cHTN.   #Labor: anticipated SVD, will examine if cervix is not ripe will start double dose cytotec #Pain: Epidural #FWB: Cat 1 #ID:  GBS- #MOF: bottle #MOC:none/undecided   #gHTN vs cHTN: no meds, asymptomatic. Pre-E labs pending. Will monitor BPs  Abram Sander, MD  06/29/2022, 10:34 AM  __ GME ATTESTATION:  Evaluation and management procedures were performed by the [facility] Medicine Resident under my supervision. I was immediately available for direct supervision,  assistance and direction throughout this encounter.  I also confirm that I have verified the information documented in the resident's note, and that I have also personally reperformed the pertinent components of the physical exam and all of the medical decision making activities.  I have also made any necessary editorial changes.  Shelda Pal, DO OB Fellow, [place] for [place] 06/29/2022 3:53 PM

## 2022-06-30 ENCOUNTER — Inpatient Hospital Stay (HOSPITAL_COMMUNITY): Payer: Medicaid Other | Admitting: Anesthesiology

## 2022-06-30 LAB — CBC
HCT: 30.1 % — ABNORMAL LOW (ref 36.0–46.0)
Hemoglobin: 10.1 g/dL — ABNORMAL LOW (ref 12.0–15.0)
MCH: 30.3 pg (ref 26.0–34.0)
MCHC: 33.6 g/dL (ref 30.0–36.0)
MCV: 90.4 fL (ref 80.0–100.0)
Platelets: 277 10*3/uL (ref 150–400)
RBC: 3.33 MIL/uL — ABNORMAL LOW (ref 3.87–5.11)
RDW: 13.9 % (ref 11.5–15.5)
WBC: 12.7 10*3/uL — ABNORMAL HIGH (ref 4.0–10.5)
nRBC: 0 % (ref 0.0–0.2)

## 2022-06-30 LAB — RPR: RPR Ser Ql: NONREACTIVE

## 2022-06-30 MED ORDER — LACTATED RINGERS IV SOLN: 500.0000 mL | Freq: Once | INTRAVENOUS | Status: DC

## 2022-06-30 MED ORDER — LIDOCAINE HCL (PF) 1 % IJ SOLN
INTRAMUSCULAR | Status: DC | PRN
  Administered 2022-06-30: 2 mL via EPIDURAL
  Administered 2022-06-30: 3 mL via EPIDURAL
  Administered 2022-06-30: 5 mL via EPIDURAL

## 2022-06-30 NOTE — Progress Notes (Signed)
Patient ID: Stefanie Farmer, female   DOB: Jul 07, 1995, 27 y.o.   MRN: [id number]  Comfortable w epidural; just completed 4h Pit break  BP 128/67 FHR 130s,. +accels, no decels, Cat 1 Ctx irreg 4-6 mins Cx 5/70/vtx -3  IUP@38 .2wks cHTN IOL process  -Restart Pit on a 4x4 schedule with a new bag -Continue rotating maternal positions -Plan to not check cx until 0500 unless she has a significant change in symptoms -Hopeful for vag delivery  Myrtis Ser CNM 06/30/2022 11:47 PM

## 2022-06-30 NOTE — Plan of Care (Signed)

## 2022-06-30 NOTE — Progress Notes (Signed)
Labor Progress Note  Sharda Keddy is a 27 y.o. G2P0010 at [redacted]w[redacted]d presented for HTN  S: Pt doing well. Epidural placed. Recent SVE  O:  BP 120/75 (BP Location: Right Arm)   Pulse 81   Temp 98.7 F (37.1 C) (Oral)   Resp 18   Ht 5\' 4"  (1.626 m)   Wt (!) 153.2 kg   LMP 10/05/2021   SpO2 100%   BMI 57.98 kg/m  EFM: 125bpm/Moderate variability/ 15x15 accels/ None decels  CVE: Dilation: 4.5 Effacement (%): 80 Station: -2 Presentation: Vertex Exam by:: Wallie Renshaw RN   A&P: 27 y.o. G2P0010 [redacted]w[redacted]d  here for IOL as above  #Labor: Progressing slowly. Cont pit 2x2. Difficulty tracing contractions. Considering IUPC #Pain: Family/Friend support and Epidural #FWB: CAT 1 #GBS negative #cHTN v gHTN: Pre E labs nml. BP sometimes elevated. Controlled now. CTM  Shelda Pal, DO [place] Fellow, Faculty practice [place] for [facility] 06/30/22  10:29 AM

## 2022-06-30 NOTE — Progress Notes (Signed)
Patient ID: Stefanie Farmer, female   DOB: [birth year], 26 y.o.   MRN: [id number] Now has epidural   Vitals:   06/30/22 0517 06/30/22 0532 06/30/22 0547 06/30/22 0602  BP: 117/67 123/65 (!) 125/55 (!) 131/54  Pulse: (!) 58 71 72 74  Resp:      Temp:      TempSrc:      SpO2:      Weight:      Height:       FHR 145 with accels and good variability UCs frequent and regular  0515am exam: Dilation: 3.5 Effacement (%): 70 Station: -3 Presentation: Vertex Exam by:: Marquita C, RN  Pitocin at 72mu/min

## 2022-06-30 NOTE — Anesthesia Preprocedure Evaluation (Signed)
Anesthesia Evaluation  Patient identified by MRN, date of birth, ID band Patient awake    Reviewed: Allergy & Precautions, Patient's Chart, lab work & pertinent test results  Airway Mallampati: III  TM Distance: >3 FB     Dental   Pulmonary Patient abstained from smoking., former smoker   Pulmonary exam normal        Cardiovascular hypertension, Normal cardiovascular exam     Neuro/Psych negative neurological ROS     GI/Hepatic negative GI ROS, Neg liver ROS,,,  Endo/Other    Morbid obesity  Renal/GU negative Renal ROS     Musculoskeletal   Abdominal   Peds  Hematology  (+) Blood dyscrasia, anemia   Anesthesia Other Findings   Reproductive/Obstetrics (+) Pregnancy                             Anesthesia Physical Anesthesia Plan  ASA: 3  Anesthesia Plan: Epidural   Post-op Pain Management:    Induction:   PONV Risk Score and Plan: 2 and Treatment may vary due to age or medical condition  Airway Management Planned: Natural Airway  Additional Equipment:   Intra-op Plan:   Post-operative Plan:   Informed Consent: I have reviewed the patients History and Physical, chart, labs and discussed the procedure including the risks, benefits and alternatives for the proposed anesthesia with the patient or authorized representative who has indicated his/her understanding and acceptance.       Plan Discussed with:   Anesthesia Plan Comments:        Anesthesia Quick Evaluation

## 2022-06-30 NOTE — Progress Notes (Signed)
Patient ID: Stefanie Farmer, female   DOB: Feb 08, 1996, 27 y.o.   MRN: [id number]  Comfortable w epidural  BPs 124/63, 115/65 FHR 130s, +accels, no decels Ctx w MVUs just less than adequate with Pit @ 52mu/min Cx deferred  IUP@38 .2wks cHTN IOL process  Thorough discussion re options> recommended Pitocin break of 4hrs and then a restart at 4x4; she prefers having an endmark in sight if she isn't making cervical change, so we discussed this being at 0500. All questions and concerns asked by patient and mother were answered, and they are agreeable with this plan.  Myrtis Ser CNM 06/30/2022

## 2022-06-30 NOTE — Anesthesia Procedure Notes (Signed)
Epidural Patient location during procedure: OB Start time: 06/30/2022 4:14 AM End time: 06/30/2022 4:24 AM  Staffing Anesthesiologist: Suzette Battiest, MD Performed: anesthesiologist   Preanesthetic Checklist Completed: patient identified, IV checked, site marked, risks and benefits discussed, surgical consent, monitors and equipment checked, pre-op evaluation and timeout performed  Epidural Patient position: sitting Prep: DuraPrep and site prepped and draped Patient monitoring: continuous pulse ox and blood pressure Approach: midline Location: L4-L5 Injection technique: LOR air  Needle:  Needle type: Tuohy  Needle gauge: 17 G Needle length: 9 cm and 9 Needle insertion depth: 9 cm Catheter type: closed end flexible Catheter size: 19 Gauge Catheter at skin depth: 17 cm Test dose: negative  Assessment Events: blood not aspirated, no cerebrospinal fluid, injection not painful, no injection resistance, no paresthesia and negative IV test

## 2022-06-30 NOTE — Progress Notes (Signed)
Labor Progress Note  Stefanie Farmer is a 27 y.o. G2P0010 at [redacted]w[redacted]d presented for HTN  S: Pt doing well. Epidural placed. Recent SVE  O:  BP 135/75 (BP Location: Left Arm)   Pulse 62   Temp 98.7 F (37.1 C) (Oral)   Resp 16   Ht 5\' 4"  (1.626 m)   Wt (!) 153.2 kg   LMP 10/05/2021   SpO2 100%   BMI 57.98 kg/m  EFM: 125bpm/Moderate variability/ 15x15 accels/ None decels  CVE: Dilation: 4.5 Effacement (%): 80 Station: -2 Presentation: Vertex Exam by:: Wallie Renshaw RN   A&P: 27 y.o. G2P0010 [redacted]w[redacted]d  here for IOL as above  #Labor: Progressing slowly. Cont pit 2x2. Difficulty tracing contractions. AROMed and IUPC placed this last check. Will monitor strength of contractions for adequacy.  #Pain: Family/Friend support and Epidural #FWB: CAT 1 #GBS negative #cHTN v gHTN: Pre E labs nml. BP sometimes elevated. Controlled now. CTM  Abram Sander, MD [place] Fellow, Faculty practice [place] for [facility] 06/30/22  1:21 PM

## 2022-07-01 ENCOUNTER — Encounter (HOSPITAL_COMMUNITY): Payer: Self-pay | Admitting: Family Medicine

## 2022-07-01 ENCOUNTER — Other Ambulatory Visit: Payer: Medicaid Other

## 2022-07-01 DIAGNOSIS — O99214 Obesity complicating childbirth: Secondary | ICD-10-CM

## 2022-07-01 DIAGNOSIS — O1002 Pre-existing essential hypertension complicating childbirth: Secondary | ICD-10-CM

## 2022-07-01 DIAGNOSIS — O403XX0 Polyhydramnios, third trimester, not applicable or unspecified: Secondary | ICD-10-CM

## 2022-07-01 DIAGNOSIS — Z3A38 38 weeks gestation of pregnancy: Secondary | ICD-10-CM

## 2022-07-01 LAB — COMPREHENSIVE METABOLIC PANEL
ALT: 30 U/L (ref 0–44)
AST: 61 U/L — ABNORMAL HIGH (ref 15–41)
Albumin: 2.3 g/dL — ABNORMAL LOW (ref 3.5–5.0)
Alkaline Phosphatase: 101 U/L (ref 38–126)
Anion gap: 7 (ref 5–15)
BUN: 5 mg/dL — ABNORMAL LOW (ref 6–20)
CO2: 23 mmol/L (ref 22–32)
Calcium: 8.5 mg/dL — ABNORMAL LOW (ref 8.9–10.3)
Chloride: 105 mmol/L (ref 98–111)
Creatinine, Ser: 0.74 mg/dL (ref 0.44–1.00)
GFR, Estimated: >60 mL/min (ref >60)
Glucose, Bld: 104 mg/dL — ABNORMAL HIGH (ref 70–99)
Potassium: 4.2 mmol/L (ref 3.5–5.1)
Sodium: 135 mmol/L (ref 135–145)
Total Bilirubin: 0.6 mg/dL (ref 0.3–1.2)
Total Protein: 5.4 g/dL — ABNORMAL LOW (ref 6.5–8.1)

## 2022-07-01 LAB — CBC
HCT: 30.1 % — ABNORMAL LOW (ref 36.0–46.0)
Hemoglobin: 10 g/dL — ABNORMAL LOW (ref 12.0–15.0)
MCH: 29.9 pg (ref 26.0–34.0)
MCHC: 33.2 g/dL (ref 30.0–36.0)
MCV: 90.1 fL (ref 80.0–100.0)
Platelets: 262 10*3/uL (ref 150–400)
RBC: 3.34 MIL/uL — ABNORMAL LOW (ref 3.87–5.11)
RDW: 14.1 % (ref 11.5–15.5)
WBC: 16.5 10*3/uL — ABNORMAL HIGH (ref 4.0–10.5)
nRBC: 0 % (ref 0.0–0.2)

## 2022-07-01 LAB — LIPASE, BLOOD: Lipase: 33 U/L (ref 11–51)

## 2022-07-01 MED ORDER — ONDANSETRON HCL 4 MG PO TABS: 4.0000 mg | ORAL_TABLET | ORAL | Status: DC | PRN

## 2022-07-01 MED ORDER — TETANUS-DIPHTH-ACELL PERTUSSIS 5-2.5-18.5 LF-MCG/0.5 IM SUSY: 0.5000 mL | PREFILLED_SYRINGE | Freq: Once | INTRAMUSCULAR | Status: DC

## 2022-07-01 MED ORDER — IBUPROFEN 600 MG PO TABS
600.0000 mg | ORAL_TABLET | Freq: Four times a day (QID) | ORAL | Status: DC
  Administered 2022-07-01 (×3): 600 mg via ORAL
  Filled 2022-07-01 (×6): qty 1

## 2022-07-01 MED ORDER — DIPHENHYDRAMINE HCL 25 MG PO CAPS: 25.0000 mg | ORAL_CAPSULE | Freq: Four times a day (QID) | ORAL | Status: DC | PRN

## 2022-07-01 MED ORDER — LIDOCAINE VISCOUS HCL 2 % MT SOLN
5.0000 mL | OROMUCOSAL | Status: DC | PRN
  Administered 2022-07-01: 5 mL via OROMUCOSAL
  Filled 2022-07-01 (×2): qty 15

## 2022-07-01 MED ORDER — COCONUT OIL OIL: 1.0000 | TOPICAL_OIL | Status: DC | PRN

## 2022-07-01 MED ORDER — WITCH HAZEL-GLYCERIN EX PADS: 1.0000 | MEDICATED_PAD | CUTANEOUS | Status: DC | PRN

## 2022-07-01 MED ORDER — PANTOPRAZOLE SODIUM 20 MG PO TBEC
20.0000 mg | DELAYED_RELEASE_TABLET | Freq: Every day | ORAL | Status: DC
  Administered 2022-07-01 – 2022-07-02 (×2): 20 mg via ORAL
  Filled 2022-07-01 (×2): qty 1

## 2022-07-01 MED ORDER — DICYCLOMINE HCL 10 MG/ML IM SOLN
20.0000 mg | Freq: Once | INTRAMUSCULAR | Status: DC
  Filled 2022-07-01: qty 2

## 2022-07-01 MED ORDER — SIMETHICONE 80 MG PO CHEW: 80.0000 mg | CHEWABLE_TABLET | ORAL | Status: DC | PRN

## 2022-07-01 MED ORDER — HYDROMORPHONE HCL 1 MG/ML IJ SOLN: 1.0000 mg | Freq: Once | INTRAMUSCULAR | Status: DC | PRN

## 2022-07-01 MED ORDER — FUROSEMIDE 20 MG PO TABS
20.0000 mg | ORAL_TABLET | Freq: Every day | ORAL | Status: DC
  Administered 2022-07-01 – 2022-07-02 (×2): 20 mg via ORAL
  Filled 2022-07-01 (×2): qty 1

## 2022-07-01 MED ORDER — ONDANSETRON HCL 4 MG/2ML IJ SOLN: 4.0000 mg | INTRAMUSCULAR | Status: DC | PRN

## 2022-07-01 MED ORDER — OXYCODONE HCL 5 MG PO TABS
5.0000 mg | ORAL_TABLET | ORAL | Status: DC | PRN
  Administered 2022-07-01: 5 mg via ORAL
  Filled 2022-07-01: qty 1

## 2022-07-01 MED ORDER — ALUM & MAG HYDROXIDE-SIMETH 200-200-20 MG/5ML PO SUSP
15.0000 mL | Freq: Four times a day (QID) | ORAL | Status: DC | PRN
  Administered 2022-07-01: 15 mL via ORAL
  Filled 2022-07-01 (×2): qty 30

## 2022-07-01 MED ORDER — PRENATAL MULTIVITAMIN CH
1.0000 | ORAL_TABLET | Freq: Every day | ORAL | Status: DC
  Administered 2022-07-01: 1 via ORAL
  Filled 2022-07-01 (×2): qty 1

## 2022-07-01 MED ORDER — DIBUCAINE (PERIANAL) 1 % EX OINT: 1.0000 | TOPICAL_OINTMENT | CUTANEOUS | Status: DC | PRN

## 2022-07-01 MED ORDER — ACETAMINOPHEN 325 MG PO TABS
650.0000 mg | ORAL_TABLET | ORAL | Status: DC | PRN
  Filled 2022-07-01: qty 2

## 2022-07-01 MED ORDER — BUPIVACAINE HCL (PF) 0.25 % IJ SOLN
INTRAMUSCULAR | Status: DC | PRN
  Administered 2022-07-01 (×2): 5 mL via EPIDURAL

## 2022-07-01 MED ORDER — SENNOSIDES-DOCUSATE SODIUM 8.6-50 MG PO TABS
2.0000 | ORAL_TABLET | ORAL | Status: DC
  Administered 2022-07-01 – 2022-07-02 (×2): 2 via ORAL
  Filled 2022-07-01 (×2): qty 2

## 2022-07-01 MED ORDER — ZOLPIDEM TARTRATE 5 MG PO TABS: 5.0000 mg | ORAL_TABLET | Freq: Every evening | ORAL | Status: DC | PRN

## 2022-07-01 MED ORDER — HYDROMORPHONE HCL 1 MG/ML IJ SOLN
1.0000 mg | Freq: Once | INTRAMUSCULAR | Status: AC
  Administered 2022-07-01: 1 mg via INTRAVENOUS
  Filled 2022-07-01: qty 1

## 2022-07-01 MED ORDER — MEASLES, MUMPS & RUBELLA VAC IJ SOLR: 0.5000 mL | Freq: Once | INTRAMUSCULAR | Status: DC

## 2022-07-01 MED ORDER — BENZOCAINE-MENTHOL 20-0.5 % EX AERO: 1.0000 | INHALATION_SPRAY | CUTANEOUS | Status: DC | PRN

## 2022-07-01 NOTE — Progress Notes (Signed)
Patient ID: Stefanie Farmer, female   DOB: 10-14-1995, 27 y.o.   MRN: [id number]  Restarted Pit @ 2300; began feeling more discomfort a little while ago so had epidural redosed and is now feeling pain again in hips/back  BP 123/79, VSS FHR 140s, +accels, no decels, Cat 1 Ctx q 2-4 mins with Pit at 31mu/min Cx 8+/C/vtx +1  IUP@38 .3wks cHTN Transition  Anesthesia in to redose again for comfort; will try to get her more comfortable; check cx in 2hrs or sooner w urge to push; anticipate vag del  Myrtis Ser [facility] 07/01/2022 4:17 AM

## 2022-07-01 NOTE — Progress Notes (Addendum)
Called to room for c/o severe epigastric pain, now going on for several hours. Pain is constant and radiates through to back.  Nothing makes it worse, rubbing her back would sometimes make it better, but nothing has helped (GI cocktail, oral Oxycodone 5mg ) tonight. Pt has had 3-4 instances over the course of the past several months w/this exact same pain, thought to be esophageal/pyloric sphincter spasms.  Historically, the pain spontaneously resolved in an hour or less, with pt feeling just fine immediately after.  Gallbladder US/labs on 12/26 were negative. Pt crying/screaming in pain. Labs pending. Dilaudid 1mg  given IV, will also try Bentyl 20mg  IM for a one time (IM) dose.  GI consult in the am.  Dr. Roosvelt Maser aware of and agreeable to plan.    11:07 PM bentyl still not up from pharmacy; however, pt's pain is completely resolved, declined the med.

## 2022-07-01 NOTE — Progress Notes (Signed)
Patient transported to postpartum 415 via wheelchair with infant in arms. Vitals remain stable. Saline lock remains patent and site without erythema/edema.

## 2022-07-01 NOTE — Progress Notes (Signed)
Report given to receiving nurse

## 2022-07-01 NOTE — Discharge Summary (Signed)
Postpartum Discharge Summary     Patient Name: Stefanie Farmer DOB: 1996-01-12 MRN: [id number]  Date of admission: 06/29/2022 Delivery date:07/01/2022  Delivering provider: Serita Grammes D  Date of discharge: 07/02/2022  Admitting diagnosis: Indication for care in labor or delivery [O75.9] Intrauterine pregnancy: [redacted]w[redacted]d     Secondary diagnosis:  Active Problems:   Morbid obesity with BMI of 45.0-49.9, adult ([place])   Chronic hypertension affecting pregnancy   Polyhydramnios affecting pregnancy   Epigastric pain   Nausea without vomiting  Additional problems: none    Discharge diagnosis: Term Pregnancy Delivered and CHTN                                              Post partum procedures: GI consult- Abdominal US (biliary sludge) Augmentation: AROM, Pitocin, Cytotec, and IP Foley Complications: None  Hospital course: Induction of Labor With Vaginal Delivery   27 y.o. yo G2P0010 at [redacted]w[redacted]d was admitted to the hospital 06/29/2022 for induction of labor.  Indication for induction:  cHTN v gHTN . Hx of sporadic mild range BP elevations during all 3 trimesters; pre-e labs neg and asymptomatic; some mild range elevations IP.  Patient had an labor course complicated by a lengthy IOL process, especially s/p cervical foley, but once active labor began she progressed well.   Membrane Rupture Time/Date: 1:14 PM ,06/30/2022   Delivery Method:Vaginal, Spontaneous  Episiotomy: None  Lacerations:  None  Details of delivery can be found in separate delivery note.  Patient had a postpartum course complicated by chronic hx of epigastric pain, GI consult and recommended abdominal US which showed biliary sludge. Planning for GI follow up and outpatient HIDA scan. Stable at the time of discharge. Patient is discharged home 07/02/22.  Newborn Data: Birth date:07/01/2022  Birth time:6:10 AM  Gender:Female  Living status:Living  Apgars:8 ,9  Weight:2920 g (6lb 7oz)  Magnesium Sulfate received: No BMZ  received: No Rhophylac:N/A MMR:N/A T-DaP:Given prenatally Flu: No Transfusion:No  Physical exam  Vitals:   07/01/22 1800 07/01/22 1938 07/02/22 0600 07/02/22 2125  BP: 120/68 121/73 (!) 111/52 126/71  Pulse: 94 85 65 87  Resp: 18 18 18 18   Temp: 98.5 F (36.9 C) 98.4 F (36.9 C) 98.5 F (36.9 C) 98.2 F (36.8 C)  TempSrc: Oral Oral Oral Oral  SpO2: 100% 100%    Weight:      Height:       General: alert, cooperative, and no distress Lochia: appropriate Uterine Fundus: firm Incision: N/A DVT Evaluation: No evidence of DVT seen on physical exam. Labs: Lab Results  Component Value Date   WBC 16.5 (H) 07/01/2022   HGB 10.0 (L) 07/01/2022   HCT 30.1 (L) 07/01/2022   MCV 90.1 07/01/2022   PLT 262 07/01/2022      Latest Ref Rng & Units 07/01/2022   10:09 PM  CMP  Glucose 70 - 99 mg/dL 104   BUN 6 - 20 mg/dL 5   Creatinine 0.44 - 1.00 mg/dL 0.74   Sodium 135 - 145 mmol/L 135   Potassium 3.5 - 5.1 mmol/L 4.2   Chloride 98 - 111 mmol/L 105   CO2 22 - 32 mmol/L 23   Calcium 8.9 - 10.3 mg/dL 8.5   Total Protein 6.5 - 8.1 g/dL 5.4   Total Bilirubin 0.3 - 1.2 mg/dL 0.6   Alkaline Phos 38 - 126  U/L 101   AST 15 - 41 U/L 61   ALT 0 - 44 U/L 30    Edinburgh Score:    07/02/2022    5:00 AM  Edinburgh Postnatal Depression Scale Screening Tool  I have been able to laugh and see the funny side of things. 0  I have looked forward with enjoyment to things. 0  I have blamed myself unnecessarily when things went wrong. 0  I have been anxious or worried for no good reason. 2  I have felt scared or panicky for no good reason. 0  Things have been getting on top of me. 1  I have been so unhappy that I have had difficulty sleeping. 1  I have felt sad or miserable. 1  I have been so unhappy that I have been crying. 1  The thought of harming myself has occurred to me. 0  Edinburgh Postnatal Depression Scale Total 6     After visit meds:  Allergies as of 07/02/2022   No Known  Allergies      Medication List     STOP taking these medications    aspirin 81 MG chewable tablet       TAKE these medications    ACCRUFeR 30 MG Caps Generic drug: Ferric Maltol Take 1 tablet by mouth 2 (two) times daily. On an empty stomach   acetaminophen 325 MG tablet Commonly known as: Tylenol Take 2 tablets (650 mg total) by mouth every 4 (four) hours as needed (for pain scale < 4).   alum & mag hydroxide-simeth 562-130-86 MG/5ML suspension Commonly known as: MAALOX PLUS Take 5 mLs by mouth every 6 (six) hours as needed for indigestion.   Blood Pressure Cuff Misc 1 kit by Does not apply route as directed.   furosemide 20 MG tablet Commonly known as: LASIX Take 1 tablet (20 mg total) by mouth daily.   ibuprofen 600 MG tablet Commonly known as: ADVIL Take 1 tablet (600 mg total) by mouth every 6 (six) hours.   lidocaine 2 % solution Commonly known as: XYLOCAINE Use as directed 5 mLs in the mouth or throat as needed for mouth pain.   omeprazole 20 MG tablet Commonly known as: PRILOSEC OTC Take 20 mg by mouth daily.   Prenatal Vitamins 28-0.8 MG Tabs Take by mouth.         Discharge home in stable condition Infant Feeding: Bottle Infant Disposition:home with mother Discharge instruction: per After Visit Summary and Postpartum booklet. Activity: Advance as tolerated. Pelvic rest for 6 weeks.  Diet: routine diet Future Appointments: Future Appointments  Date Time Provider [place]  07/13/2022  2:30 PM [facility] NURSE [facility]  08/13/2022 10:50 AM Myrtis Ser, CNM [facility]   Follow up Visit:  Myrtis Ser, CNM  Meredeth Ide R Please schedule this patient for Postpartum visit in: 6 weeks with the following provider: Any provider In-Person For C/S patients schedule nurse incision check in weeks 2 weeks: no High risk pregnancy complicated by: cHTN vs gHTN Delivery mode:  SVD Anticipated Birth Control:  other/unsure PP  Procedures needed: BP check in 1wk w RN, GI follow up/HIDA scan Schedule Integrated BH visit: no  07/02/2022 Zev Blue [place], DO

## 2022-07-01 NOTE — Anesthesia Postprocedure Evaluation (Signed)
Anesthesia Post Note  Patient: Rashawnda Gaba  Procedure(s) Performed: AN AD HOC LABOR EPIDURAL     Patient location during evaluation: Mother Baby Anesthesia Type: Epidural Level of consciousness: awake and alert and oriented Pain management: satisfactory to patient Vital Signs Assessment: post-procedure vital signs reviewed and stable Respiratory status: respiratory function stable Cardiovascular status: stable Postop Assessment: no headache, no backache, epidural receding, patient able to bend at knees, no signs of nausea or vomiting, adequate PO intake and able to ambulate Anesthetic complications: no Comments: The patient stated that her epidural only half worked. She said that her right leg/side became numb but her left leg/side was normal until another anesthesiologist  on the next shift came and pulled the epidural back and gave "stronger" medication. After that she felt some numbness in the left leg and a little bit of relief. The anesthesiologist came back awhile later to see her again but she had progressed to 8 cm and she was told by the anesthesiologist that it was too risky to adjust the epidural at that point. The patient related that she considered the delivery natural childbirth.    No notable events documented.  Last Vitals:  Vitals:   07/01/22 0731 07/01/22 0843  BP: 126/64 (!) 137/57  Pulse: 84 86  Resp: 18 16  Temp: 37.8 C 37.1 C  SpO2:  98%    Last Pain:  Vitals:   07/01/22 0843  TempSrc: Oral  PainSc: 0-No pain   Pain Goal:                   Obinna Ehresman

## 2022-07-02 ENCOUNTER — Other Ambulatory Visit (HOSPITAL_COMMUNITY): Payer: Self-pay

## 2022-07-02 ENCOUNTER — Inpatient Hospital Stay (HOSPITAL_COMMUNITY): Payer: Medicaid Other

## 2022-07-02 DIAGNOSIS — R1013 Epigastric pain: Secondary | ICD-10-CM

## 2022-07-02 DIAGNOSIS — R110 Nausea: Secondary | ICD-10-CM

## 2022-07-02 LAB — URINALYSIS, ROUTINE W REFLEX MICROSCOPIC
Bilirubin Urine: NEGATIVE
Glucose, UA: NEGATIVE mg/dL
Ketones, ur: NEGATIVE mg/dL
Nitrite: NEGATIVE
Protein, ur: 30 mg/dL — AB
RBC / HPF: >50 RBC/hpf (ref 0–5)
Specific Gravity, Urine: 1.023 (ref 1.005–1.030)
WBC, UA: >50 WBC/hpf (ref 0–5)
pH: 5 (ref 5.0–8.0)

## 2022-07-02 MED ORDER — IBUPROFEN 600 MG PO TABS
600.0000 mg | ORAL_TABLET | Freq: Four times a day (QID) | ORAL | 0 refills | Status: DC
  Filled 2022-07-02: qty 30, 8d supply, fill #0

## 2022-07-02 MED ORDER — ACETAMINOPHEN 325 MG PO TABS: 650.0000 mg | ORAL_TABLET | ORAL | 0 refills | Status: DC | PRN

## 2022-07-02 MED ORDER — FUROSEMIDE 20 MG PO TABS
20.0000 mg | ORAL_TABLET | Freq: Every day | ORAL | 0 refills | Status: DC
  Filled 2022-07-02: qty 3, 3d supply, fill #0

## 2022-07-02 NOTE — Progress Notes (Signed)
Went over discharge instructions for mom and baby.  Patients verbalized understanding, no further questions.

## 2022-07-02 NOTE — Progress Notes (Signed)
Post Partum Day 1 Subjective: no complaints, up ad lib, voiding and tolerating PO, small lochia, plans to bottle feed,  discussed BC, wants nonhormonal, will decide at pp visit.  No more epigastric pain since last night  Objective: Blood pressure (!) 111/52, pulse 65, temperature 98.5 F (36.9 C), temperature source Oral, resp. rate 18, height 5\' 4"  (1.626 m), weight (!) 153.2 kg, last menstrual period 10/05/2021, SpO2 100 %, unknown if currently breastfeeding.  Physical Exam:  General: alert, cooperative and no distress Lochia:normal flow Chest: CTAB Heart: RRR no m/r/g Abdomen: +BS, soft, nontender,  Uterine Fundus: firm DVT Evaluation: No evidence of DVT seen on physical exam. Extremities: 1+ edema  Recent Labs    06/30/22 0324 07/01/22 2209  HGB 10.1* 10.0*  HCT 30.1* 30.1*    Assessment/Plan: PPD#1, stable Severe episodic epigastric/back pain; GI consult ordered, spoke w/Dr. Tarri Glenn. Will come see today.   LOS: 3 days   Christin Fudge 07/02/2022, 8:09 AM

## 2022-07-02 NOTE — Consult Note (Signed)
Consultation  Referring Provider: OB/ GYNEl[facility] Primary Care Physician:  Patient, No Pcp Per Primary Gastroenterologist:  none unassigned  Reason for Consultation: Episodic severe epigastric pain  HPI: Stefanie Farmer is a 27 y.o. female, who was admitted to the hospital on 06/29/2022 for induction at 38 weeks pregnancy.  Patient has history of morbid obesity, and hypertension as well as ureterolithiasis. She delivered a healthy girl yesterday .  Patient had an episode of severe epigastric pain last night which lasted for at least 2 hours and then gradually resolved.  Notes report that she was screaming out in pain.  She says the pain is constant and radiates straight through into her back.  She gets a bit nauseated because of the severe pain but does not vomit.  No associated diarrhea.  In the past with these episodes she has had diaphoresis. Comfortable today and has not had any recurrence of symptoms and was able to eat solid food for breakfast.  She relates that she has had 3-4 episodes very similar to the one last night at home over the past 3 to 4 months and had never had an episode prior to pregnancy.  The episodes at home have lasted from anywhere between 45 minutes to 3 hours. Last episode was 1 week ago.  She says the only relief she has found during these episodes is for her boyfriend to rub really hard in the middle of her back. Episode started to feel like indigestion but then the pain becomes just severe and constant. During his episodes she feels fine she does not have any ongoing heartburn or indigestion symptoms as occasionally she will have what she thinks is acid reflux.  No dysphagia or odynophagia.  Appetite has been fine, she has no previous diagnosed GI issues.  Prior abdominal surgery  She did have abdominal ultrasound done in December 2023 which showed no evidence of gallstones or gallbladder wall thickening and CBD was 2 mm.  Labs yesterday evening with potassium  4.2/BUN 5/creatinine 0.74 Albumin 2.3 AST 61 LFTs otherwise normal Lipase 33 WBC 16.5/hemoglobin 10/hematocrit 30. UA was done and shows greater than 50 WBCs greater than 50 RBCs and small leukocytosis  Does not have family history of GI disease that she is aware of, no family history of gallbladder disease   Past Medical History:  Diagnosis Date   Hypertension    Kidney stone     Past Surgical History:  Procedure Laterality Date   CYSTOSCOPY W/ URETERAL STENT PLACEMENT Right 07/23/2021   Procedure: CYSTOSCOPY WITH RETROGRADE PYELOGRAM/URETERAL STENT PLACEMENT;  Surgeon: Cleon Gustin, MD;  Location: [facility];  Service: Urology;  Laterality: Right;   CYSTOSCOPY W/ URETERAL STENT PLACEMENT Right 08/13/2021   Procedure: CYSTOSCOPY WITH RETROGRADE PYELOGRAM/URETERAL STENT EXCHANGE;  Surgeon: Cleon Gustin, MD;  Location: [facility];  Service: Urology;  Laterality: Right;   EYE MUSCLE SURGERY     FOOT SURGERY     HOLMIUM LASER APPLICATION Right 7/86/7672   Procedure: HOLMIUM LASER APPLICATION;  Surgeon: Cleon Gustin, MD;  Location: [facility];  Service: Urology;  Laterality: Right;   ORIF ANKLE FRACTURE Right 06/24/2020   Procedure: OPEN REDUCTION INTERNAL FIXATION (ORIF) ANKLE FRACTURE;  Surgeon: Carole Civil, MD;  Location: [facility];  Service: Orthopedics;  Laterality: Right;    Prior to Admission medications   Medication Sig Start Date End Date Taking? Authorizing Provider  alum & mag hydroxide-simeth (MAALOX PLUS) 400-400-40 MG/5ML suspension Take 5 mLs by mouth every  6 (six) hours as needed for indigestion.    [provider]  aspirin 81 MG chewable tablet Chew 2 tablets (162 mg total) by mouth daily. 01/01/22   Myrtis Ser, CNM  Blood Pressure Monitoring (BLOOD PRESSURE CUFF) MISC 1 kit by Does not apply route as directed. 01/01/22   Myrtis Ser, CNM  Ferric Maltol (ACCRUFER) 30 MG CAPS Take 1 tablet by mouth 2 (two) times daily. On an empty stomach  04/20/22   Janyth Pupa, DO  furosemide (LASIX) 20 MG tablet Take 1 tablet (20 mg total) by mouth daily. 07/02/22   Cresenzo-Dishmon, Joaquim Lai, CNM  ibuprofen (ADVIL) 600 MG tablet Take 1 tablet (600 mg total) by mouth every 6 (six) hours. 07/02/22   Cresenzo-Dishmon, Joaquim Lai, CNM  lidocaine (XYLOCAINE) 2 % solution Use as directed 5 mLs in the mouth or throat as needed for mouth pain. Patient not taking: Reported on 06/28/2022 06/24/22   Cresenzo-Dishmon, Joaquim Lai, CNM  omeprazole (PRILOSEC OTC) 20 MG tablet Take 20 mg by mouth daily.    [provider]  Prenatal Vit-Fe Fumarate-FA (PRENATAL VITAMINS) 28-0.8 MG TABS Take by mouth.    [provider]    Current Facility-Administered Medications  Medication Dose Route Frequency Provider Last Rate Last Admin   acetaminophen (TYLENOL) tablet 650 mg  650 mg Oral Q4H PRN Myrtis Ser, CNM       alum & mag hydroxide-simeth (MAALOX/MYLANTA) 200-200-20 MG/5ML suspension 15 mL  15 mL Oral Q6H PRN Abram Sander, MD   15 mL at 07/01/22 2045   benzocaine-Menthol (DERMOPLAST) 20-0.5 % topical spray 1 Application  1 Application Topical PRN Myrtis Ser, CNM       coconut oil  1 Application Topical PRN Myrtis Ser, CNM       witch hazel-glycerin (TUCKS) pad 1 Application  1 Application Topical PRN Myrtis Ser, CNM       And   dibucaine (NUPERCAINAL) 1 % rectal ointment 1 Application  1 Application Rectal PRN Myrtis Ser, CNM       diphenhydrAMINE (BENADRYL) capsule 25 mg  25 mg Oral Q6H PRN Myrtis Ser, CNM       furosemide (LASIX) tablet 20 mg  20 mg Oral Daily Myrtis Ser, CNM   20 mg at 07/02/22 1610   HYDROmorphone (DILAUDID) injection 1 mg  1 mg Intravenous Once PRN Cresenzo-Dishmon, Joaquim Lai, CNM       ibuprofen (ADVIL) tablet 600 mg  600 mg Oral Q6H Serita Grammes D, CNM   600 mg at 07/01/22 2051   lidocaine (XYLOCAINE) 2 % viscous mouth solution 5 mL  5 mL Mouth/Throat PRN Abram Sander, MD   5 mL at  07/01/22 2046   measles, mumps & rubella vaccine (MMR) injection 0.5 mL  0.5 mL Subcutaneous Once Myrtis Ser, CNM       ondansetron [facility]) tablet 4 mg  4 mg Oral Q4H PRN Myrtis Ser, CNM       Or   ondansetron [facility]) injection 4 mg  4 mg Intravenous Q4H PRN Serita Grammes D, CNM       oxyCODONE (Oxy IR/ROXICODONE) immediate release tablet 5 mg  5 mg Oral Q4H PRN Serita Grammes D, CNM   5 mg at 07/01/22 2121   pantoprazole (PROTONIX) EC tablet 20 mg  20 mg Oral Daily Abram Sander, MD   20 mg at 07/02/22 0955   prenatal multivitamin tablet 1 tablet  1 tablet Oral Q1200 Serita Grammes  D, CNM   1 tablet at 07/01/22 1224   senna-docusate (Senokot-S) tablet 2 tablet  2 tablet Oral Q24H Serita Grammes D, CNM   2 tablet at 07/02/22 0867   simethicone (MYLICON) chewable tablet 80 mg  80 mg Oral PRN Myrtis Ser, CNM       Tdap (Alpine) injection 0.5 mL  0.5 mL Intramuscular Once Myrtis Ser, CNM       zolpidem (AMBIEN) tablet 5 mg  5 mg Oral QHS PRN Myrtis Ser, CNM        Allergies as of 06/21/2022   (No Known Allergies)    Family History  Problem Relation Age of Onset   Osteoporosis Mother    Fibromyalgia Mother    Hypertension Father    Diabetes Maternal Grandmother    Crohn's disease Maternal Grandmother    Diabetes Maternal Grandfather    COPD Maternal Grandfather    Diabetes Paternal Grandmother     Social History   Socioeconomic History   Marital status: Soil scientist    Spouse name: Not on file   Number of children: Not on file   Years of education: Not on file   Highest education level: Not on file  Occupational History   Not on file  Tobacco Use   Smoking status: Former    Types: Cigarettes   Smokeless tobacco: Never  Vaping Use   Vaping Use: Every day   Substances: Nicotine, Flavoring  Substance and Sexual Activity   Alcohol use: Never   Drug use: Not Currently    Frequency: 7.0 times per week    Types: Marijuana    Comment:  last used June 2023   Sexual activity: Yes    Birth control/protection: None  Other Topics Concern   Not on file  Social History Narrative   Not on file   Social Determinants of Health   Financial Resource Strain: Low Risk  (01/01/2022)   Overall Financial Resource Strain (CARDIA)    Difficulty of Paying Living Expenses: Not very hard  Food Insecurity: No Food Insecurity (01/01/2022)   Hunger Vital Sign    Worried About Running Out of Food in the Last Year: Never true    Ran Out of Food in the Last Year: Never true  Transportation Needs: No Transportation Needs (01/01/2022)   PRAPARE - Hydrologist (Medical): No    Lack of Transportation (Non-Medical): No  Physical Activity: Insufficiently Active (01/01/2022)   Exercise Vital Sign    Days of Exercise per Week: 1 day    Minutes of Exercise per Session: 10 min  Stress: No Stress Concern Present (01/01/2022)   [place]    Feeling of Stress : Only a little  Social Connections: Socially Integrated (01/01/2022)   Social Connection and Isolation Panel [NHANES]    Frequency of Communication with Friends and Family: More than three times a week    Frequency of Social Gatherings with Friends and Family: Three times a week    Attends Religious Services: More than 4 times per year    Active Member of Clubs or Organizations: No    Attends Archivist Meetings: 1 to 4 times per year    Marital Status: Living with partner  Intimate Partner Violence: Not At Risk (01/01/2022)   Humiliation, Afraid, Rape, and Kick questionnaire    Fear of Current or Ex-Partner: No    Emotionally Abused: No  Physically Abused: No    Sexually Abused: No    Review of Systems: Pertinent positive and negative review of systems were noted in the above HPI section.  All other review of systems was otherwise negative.  Physical Exam: Vital signs in last 24 hours: Temp:   [98.2 F (36.8 C)-98.5 F (36.9 C)] 98.5 F (36.9 C) (02/02 0600) Pulse Rate:  [65-94] 65 (02/02 0600) Resp:  [16-18] 18 (02/02 0600) BP: (111-138)/(52-78) 111/52 (02/02 0600) SpO2:  [100 %] 100 % (02/01 1938)   General:   Alert,  Well-developed, well-nourished, obese young white female pleasant and cooperative in NAD Head:  Normocephalic and atraumatic. Eyes:  Sclera clear, no icterus.   Conjunctiva pink. Ears:  Normal auditory acuity. Nose:  No deformity, discharge,  or lesions. Mouth:  No deformity or lesions.   Neck:  Supple; no masses or thyromegaly. Lungs:  Clear throughout to auscultation.   No wheezes, crackles, or rhonchi. Heart:  Regular rate and rhythm; no murmurs, clicks, rubs,  or gallops. Abdomen:  Soft, BS, nontender nondistended BS active,nonpalp mass or hsm.   Rectal: Not done Msk:  Symmetrical without gross deformities. . Pulses:  Normal pulses noted. Extremities:  Without clubbing or edema. Neurologic:  Alert and  oriented x4;  grossly normal neurologically. Skin:  Intact without significant lesions or rashes.. Psych:  Alert and cooperative. Normal mood and affect.  Intake/Output from previous day: No intake/output data recorded. Intake/Output this shift: No intake/output data recorded.  Lab Results: Recent Labs    06/30/22 0324 07/01/22 2209  WBC 12.7* 16.5*  HGB 10.1* 10.0*  HCT 30.1* 30.1*  PLT 277 262   BMET Recent Labs    07/01/22 2209  NA 135  K 4.2  CL 105  CO2 23  GLUCOSE 104*  BUN 5*  CREATININE 0.74  CALCIUM 8.5*   LFT Recent Labs    07/01/22 2209  PROT 5.4*  ALBUMIN 2.3*  AST 61*  ALT 30  ALKPHOS 101  BILITOT 0.6   PT/INR No results for input(s): "LABPROT", "INR" in the last 72 hours. Hepatitis Panel No results for input(s): "HEPBSAG", "HCVAB", "HEPAIGM", "HEPBIGM" in the last 72 hours.    IMPRESSION:  #34 27 year old female admitted on 06/29/2022 for induction at 38 weeks intrauterine pregnancy.  She delivered a  healthy female yesterday  #2 episode of severe epigastric pain radiating through to the back last night lasting for about 2 hours Patient reports 3-4 similar episodes at home over the past several months lasting anywhere between 45 minutes to 3 hours.  Pain is generally constant and unrelenting during that time sometimes associated with nausea but no vomiting and sometimes with diaphoresis. Feels fine in between these episodes does not have any ongoing GI symptoms. Labs from last evening were reassuring other than WBC of 16.5  Negative ultrasound in December  Her symptoms are still very suspicious for biliary colic, rule out biliary dyskinesia  #3 abnormal UA- UTI #4 obesity # 5 history of ureterolithiasis  PLAN: Keep n.p.o until  after ultrasound For upper abdominal ultrasound this afternoon.  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     Bethani Brugger PA-C 07/02/2022, 12:00 PM

## 2022-07-05 ENCOUNTER — Encounter: Payer: Medicaid Other | Admitting: Advanced Practice Midwife

## 2022-07-05 ENCOUNTER — Encounter: Payer: Self-pay | Admitting: Gastroenterology

## 2022-07-05 ENCOUNTER — Other Ambulatory Visit: Payer: Medicaid Other

## 2022-07-08 ENCOUNTER — Other Ambulatory Visit: Payer: Medicaid Other

## 2022-07-13 ENCOUNTER — Ambulatory Visit (INDEPENDENT_AMBULATORY_CARE_PROVIDER_SITE_OTHER): Payer: Medicaid Other | Admitting: *Deleted

## 2022-07-13 VITALS — BP 129/76 | HR 76

## 2022-07-13 DIAGNOSIS — I10 Essential (primary) hypertension: Secondary | ICD-10-CM

## 2022-07-13 NOTE — Progress Notes (Signed)
   NURSE VISIT- BLOOD PRESSURE CHECK  SUBJECTIVE:  Stefanie Farmer is a 27 y.o. G32P1011 female here for BP check. She is postpartum, delivery date 07/01/22     HYPERTENSION ROS:  Postpartum:  Severe headaches that don't go away with tylenol/other medicines: No  Visual changes (seeing spots/double/blurred vision) No  Severe pain under right breast breast or in center of upper chest No  Severe nausea/vomiting No  Taking medicines as instructed not applicable  OBJECTIVE:  BP 129/76 (BP Location: Right Arm, Patient Position: Sitting, Cuff Size: Normal) Comment (BP Location): lower arm  Pulse 76   Breastfeeding No   Appearance alert, well appearing, and in no distress and oriented to person, place, and time.  ASSESSMENT: Postpartum  blood pressure check  PLAN: Discussed with Dr. Rip Harbour   Recommendations: no changes needed   Follow-up: as scheduled   Alice Rieger  07/13/2022 3:16 PM

## 2022-07-22 ENCOUNTER — Other Ambulatory Visit: Payer: Self-pay

## 2022-07-22 ENCOUNTER — Emergency Department (HOSPITAL_COMMUNITY): Payer: Medicaid Other

## 2022-07-22 ENCOUNTER — Emergency Department (HOSPITAL_COMMUNITY)
Admission: EM | Admit: 2022-07-22 | Discharge: 2022-07-22 | Disposition: A | Discharge status: Home / Self Care | Payer: Medicaid Other | Attending: Emergency Medicine | Admitting: Emergency Medicine

## 2022-07-22 ENCOUNTER — Encounter (HOSPITAL_COMMUNITY): Payer: Self-pay | Admitting: Emergency Medicine

## 2022-07-22 DIAGNOSIS — R1013 Epigastric pain: Secondary | ICD-10-CM | POA: Insufficient documentation

## 2022-07-22 DIAGNOSIS — O26891 Other specified pregnancy related conditions, first trimester: Secondary | ICD-10-CM | POA: Diagnosis not present

## 2022-07-22 DIAGNOSIS — Z3A01 Less than 8 weeks gestation of pregnancy: Secondary | ICD-10-CM | POA: Insufficient documentation

## 2022-07-22 DIAGNOSIS — R1010 Upper abdominal pain, unspecified: Secondary | ICD-10-CM

## 2022-07-22 LAB — COMPREHENSIVE METABOLIC PANEL
ALT: 37 U/L (ref 0–44)
AST: 34 U/L (ref 15–41)
Albumin: 3.4 g/dL — ABNORMAL LOW (ref 3.5–5.0)
Alkaline Phosphatase: 90 U/L (ref 38–126)
Anion gap: 10 (ref 5–15)
BUN: 12 mg/dL (ref 6–20)
CO2: 24 mmol/L (ref 22–32)
Calcium: 8.9 mg/dL (ref 8.9–10.3)
Chloride: 103 mmol/L (ref 98–111)
Creatinine, Ser: 0.92 mg/dL (ref 0.44–1.00)
GFR, Estimated: >60 mL/min (ref >60)
Glucose, Bld: 142 mg/dL — ABNORMAL HIGH (ref 70–99)
Potassium: 3.6 mmol/L (ref 3.5–5.1)
Sodium: 137 mmol/L (ref 135–145)
Total Bilirubin: 0.7 mg/dL (ref 0.3–1.2)
Total Protein: 6.6 g/dL (ref 6.5–8.1)

## 2022-07-22 LAB — URINALYSIS, ROUTINE W REFLEX MICROSCOPIC
Bacteria, UA: NONE SEEN
Bilirubin Urine: NEGATIVE
Glucose, UA: NEGATIVE mg/dL
Ketones, ur: NEGATIVE mg/dL
Leukocytes,Ua: NEGATIVE
Nitrite: NEGATIVE
Protein, ur: NEGATIVE mg/dL
Specific Gravity, Urine: 1.018 (ref 1.005–1.030)
pH: 5 (ref 5.0–8.0)

## 2022-07-22 LAB — CBC WITH DIFFERENTIAL/PLATELET
Abs Immature Granulocytes: 0.04 10*3/uL (ref 0.00–0.07)
Basophils Absolute: 0.1 10*3/uL (ref 0.0–0.1)
Basophils Relative: 1 %
Eosinophils Absolute: 0 10*3/uL (ref 0.0–0.5)
Eosinophils Relative: 0 %
HCT: 37.2 % (ref 36.0–46.0)
Hemoglobin: 12 g/dL (ref 12.0–15.0)
Immature Granulocytes: 0 %
Lymphocytes Relative: 33 %
Lymphs Abs: 3.1 10*3/uL (ref 0.7–4.0)
MCH: 28.7 pg (ref 26.0–34.0)
MCHC: 32.3 g/dL (ref 30.0–36.0)
MCV: 89 fL (ref 80.0–100.0)
Monocytes Absolute: 0.7 10*3/uL (ref 0.1–1.0)
Monocytes Relative: 7 %
Neutro Abs: 5.5 10*3/uL (ref 1.7–7.7)
Neutrophils Relative %: 59 %
Platelets: 373 10*3/uL (ref 150–400)
RBC: 4.18 MIL/uL (ref 3.87–5.11)
RDW: 13 % (ref 11.5–15.5)
WBC: 9.4 10*3/uL (ref 4.0–10.5)
nRBC: 0 % (ref 0.0–0.2)

## 2022-07-22 LAB — LIPASE, BLOOD: Lipase: 36 U/L (ref 11–51)

## 2022-07-22 MED ORDER — HYDROMORPHONE HCL 1 MG/ML IJ SOLN
1.0000 mg | Freq: Once | INTRAMUSCULAR | Status: AC
  Administered 2022-07-22: 1 mg via INTRAVENOUS
  Filled 2022-07-22: qty 1

## 2022-07-22 MED ORDER — LIDOCAINE VISCOUS HCL 2 % MT SOLN
15.0000 mL | Freq: Once | OROMUCOSAL | Status: AC
  Administered 2022-07-22: 15 mL via ORAL
  Filled 2022-07-22: qty 15

## 2022-07-22 MED ORDER — PANTOPRAZOLE SODIUM 40 MG IV SOLR
40.0000 mg | Freq: Once | INTRAVENOUS | Status: AC
  Administered 2022-07-22: 40 mg via INTRAVENOUS
  Filled 2022-07-22: qty 10

## 2022-07-22 MED ORDER — OXYCODONE-ACETAMINOPHEN 5-325 MG PO TABS: ORAL_TABLET | ORAL | 0 refills | Status: DC

## 2022-07-22 MED ORDER — ALUM & MAG HYDROXIDE-SIMETH 200-200-20 MG/5ML PO SUSP
30.0000 mL | Freq: Once | ORAL | Status: AC
  Administered 2022-07-22: 30 mL via ORAL
  Filled 2022-07-22: qty 30

## 2022-07-22 MED ORDER — IOHEXOL 300 MG/ML  SOLN
100.0000 mL | Freq: Once | INTRAMUSCULAR | Status: AC | PRN
  Administered 2022-07-22: 100 mL via INTRAVENOUS

## 2022-07-22 MED ORDER — ONDANSETRON 4 MG PO TBDP: ORAL_TABLET | ORAL | 0 refills | Status: DC

## 2022-07-22 NOTE — ED Provider Notes (Signed)
[facility] Provider Note   CSN: [id number] Arrival date & time: 07/22/22  0344     History  Chief Complaint  Patient presents with   Abdominal Pain    Stefanie Farmer is a 27 y.o. female.  G2P1 at approx 3 weeks postpartum here with epigastric pain that radiates to the back associated with emesis. Had similar symptoms when she was pregnant. Told it was indigestion, then esophageal spasm then again that it could have been the gall bladder sludge. Tonight it started around 0200 and intense in nature, bringing her to ED for evaluation. No fevers. No diarrhea, constipation or urinary changes associated with it. Oxycodone doesn't help.    Abdominal Pain      Home Medications Prior to Admission medications   Medication Sig Start Date End Date Taking? Authorizing Provider  acetaminophen (TYLENOL) 325 MG tablet Take 2 tablets (650 mg total) by mouth every 4 (four) hours as needed (for pain scale < 4). Patient not taking: Reported on 07/13/2022 07/02/22   Autry-Lott, Naaman Plummer, DO  alum & mag hydroxide-simeth (MAALOX PLUS) 400-400-40 MG/5ML suspension Take 5 mLs by mouth every 6 (six) hours as needed for indigestion. Patient not taking: Reported on 07/13/2022    [provider]  Blood Pressure Monitoring (BLOOD PRESSURE CUFF) MISC 1 kit by Does not apply route as directed. Patient not taking: Reported on 07/13/2022 01/01/22   Myrtis Ser, CNM  Ferric Maltol (ACCRUFER) 30 MG CAPS Take 1 tablet by mouth 2 (two) times daily. On an empty stomach Patient not taking: Reported on 07/13/2022 04/20/22   Janyth Pupa, DO  ibuprofen (ADVIL) 600 MG tablet Take 1 tablet (600 mg total) by mouth every 6 (six) hours. Patient not taking: Reported on 07/13/2022 07/02/22   Cresenzo-Dishmon, Joaquim Lai, CNM  lidocaine (XYLOCAINE) 2 % solution Use as directed 5 mLs in the mouth or throat as needed for mouth pain. Patient not taking: Reported on 06/28/2022 06/24/22    Cresenzo-Dishmon, Joaquim Lai, CNM  omeprazole (PRILOSEC OTC) 20 MG tablet Take 20 mg by mouth daily. Patient not taking: Reported on 07/13/2022    [provider]  Prenatal Vit-Fe Fumarate-FA (PRENATAL VITAMINS) 28-0.8 MG TABS Take by mouth. Patient not taking: Reported on 07/13/2022    [provider]      Allergies    Patient has no known allergies.    Review of Systems   Review of Systems  Gastrointestinal:  Positive for abdominal pain.    Physical Exam Updated Vital Signs BP 116/74   Pulse 78   Temp 98 F (36.7 C) (Oral)   Resp 18   Ht '5\' 4"'$  (1.626 m)   Wt (!) 153.2 kg   SpO2 98%   Breastfeeding No   BMI 57.98 kg/m  Physical Exam Vitals and nursing note reviewed.  Constitutional:      Appearance: She is well-developed.  HENT:     Head: Normocephalic and atraumatic.  Cardiovascular:     Rate and Rhythm: Normal rate and regular rhythm.  Pulmonary:     Effort: No respiratory distress.     Breath sounds: No stridor.  Abdominal:     General: There is no distension.     Tenderness: There is no abdominal tenderness.  Musculoskeletal:     Cervical back: Normal range of motion.  Neurological:     Mental Status: She is alert.     ED Results / Procedures / Treatments   Labs (all labs ordered are  listed, but only abnormal results are displayed) Labs Reviewed  COMPREHENSIVE METABOLIC PANEL - Abnormal; Notable for the following components:      Result Value   Glucose, Bld 142 (*)    Albumin 3.4 (*)    All other components within normal limits  URINALYSIS, ROUTINE W REFLEX MICROSCOPIC - Abnormal; Notable for the following components:   Hgb urine dipstick SMALL (*)    All other components within normal limits  CBC WITH DIFFERENTIAL/PLATELET  LIPASE, BLOOD    EKG None  Radiology No results found.  Procedures Procedures    Medications Ordered in ED Medications  pantoprazole (PROTONIX) injection 40 mg (has no administration in time range)   alum & mag hydroxide-simeth (MAALOX/MYLANTA) 200-200-20 MG/5ML suspension 30 mL (has no administration in time range)    And  lidocaine (XYLOCAINE) 2 % viscous mouth solution 15 mL (has no administration in time range)  iohexol (OMNIPAQUE) 300 MG/ML solution 100 mL (has no administration in time range)    ED Course/ Medical Decision Making/ A&P                             Medical Decision Making Amount and/or Complexity of Data Reviewed Labs: ordered. Radiology: ordered.  Risk OTC drugs. Prescription drug management.  Pancreatitis vs PUD vs gastritis vs less likely cholecystitis. Will eval for same and treat symptomatically in mean time.  CT with dilated GB on my interpretation, radiology notes sludge and possible gallstone as well and recommended ultrasound.  White count and liver enzymes are within normal limits.  Pain is controlled.  Will sign out to oncoming provider pending ultrasound for cholecystitis but if negative can likely get elective surgery.   Final Clinical Impression(s) / ED Diagnoses Final diagnoses:  None    Rx / DC Orders ED Discharge Orders     None         Lanetta Figuero, Corene Cornea, MD 07/23/22 [phone]

## 2022-07-22 NOTE — ED Provider Notes (Signed)
Patient with abdominal pain and gallstones.  Presently she does not have cholecystitis on ultrasound.  Labs unremarkable.  Patient will go home with pain medicine and nausea medicine and make an appointment for follow-up with general surgery next week   Milton Ferguson, MD 07/22/22 1008

## 2022-07-22 NOTE — ED Triage Notes (Signed)
Pt c/o RUQ pain since about 2am this morning. Pt states she gave birth about 3 wks ago and was told afterwards that she had gallbladder sludge.

## 2022-07-22 NOTE — Discharge Instructions (Signed)
Call make an appointment to see Dr. Arnoldo Morale next week or you can see one of his colleagues

## 2022-07-29 ENCOUNTER — Encounter: Payer: Self-pay | Admitting: Radiology

## 2022-08-03 ENCOUNTER — Ambulatory Visit: Payer: Medicaid Other | Admitting: Gastroenterology

## 2022-08-05 ENCOUNTER — Encounter: Payer: Self-pay | Admitting: General Surgery

## 2022-08-05 ENCOUNTER — Ambulatory Visit (INDEPENDENT_AMBULATORY_CARE_PROVIDER_SITE_OTHER): Payer: Medicaid Other | Admitting: General Surgery

## 2022-08-05 VITALS — BP 130/90 | HR 96 | Temp 98.1°F | Resp 14 | Ht 64.0 in | Wt 308.0 lb

## 2022-08-05 DIAGNOSIS — K8020 Calculus of gallbladder without cholecystitis without obstruction: Secondary | ICD-10-CM | POA: Diagnosis not present

## 2022-08-05 NOTE — Progress Notes (Signed)
Stefanie Farmer; [id number]; 28-Jun-1995   HPI Patient is a 27 year old white female who was referred to my care by the emergency room and [facility] for evaluation treatment of cholelithiasis.  Patient is postpartum.  During her pregnancy, she did have episodes of epigastric pain and nausea.  She delivered without difficulty on 06/29/2022.  She ended up in the emergency room on 07/22/2022 with epigastric pain.  CT scan of the abdomen revealed cholelithiasis.  Her liver enzyme tests were within normal limits.  Since her discharge, she has been avoiding any fatty or greasy foods.  She denies any fever, chills, or jaundice.  She is not breast-feeding. Past Medical History:  Diagnosis Date   Hypertension    Kidney stone     Past Surgical History:  Procedure Laterality Date   CYSTOSCOPY W/ URETERAL STENT PLACEMENT Right 07/23/2021   Procedure: CYSTOSCOPY WITH RETROGRADE PYELOGRAM/URETERAL STENT PLACEMENT;  Surgeon: Cleon Gustin, MD;  Location: [facility];  Service: Urology;  Laterality: Right;   CYSTOSCOPY W/ URETERAL STENT PLACEMENT Right 08/13/2021   Procedure: CYSTOSCOPY WITH RETROGRADE PYELOGRAM/URETERAL STENT EXCHANGE;  Surgeon: Cleon Gustin, MD;  Location: [facility];  Service: Urology;  Laterality: Right;   EYE MUSCLE SURGERY     FOOT SURGERY     HOLMIUM LASER APPLICATION Right [id number]   Procedure: HOLMIUM LASER APPLICATION;  Surgeon: Cleon Gustin, MD;  Location: [facility];  Service: Urology;  Laterality: Right;   ORIF ANKLE FRACTURE Right 06/24/2020   Procedure: OPEN REDUCTION INTERNAL FIXATION (ORIF) ANKLE FRACTURE;  Surgeon: Carole Civil, MD;  Location: [facility];  Service: Orthopedics;  Laterality: Right;    Family History  Problem Relation Age of Onset   Osteoporosis Mother    Fibromyalgia Mother    Hypertension Father    Diabetes Maternal Grandmother    Crohn's disease Maternal Grandmother    Diabetes Maternal Grandfather    COPD Maternal Grandfather     Diabetes Paternal Grandmother     Current Outpatient Medications on File Prior to Visit  Medication Sig Dispense Refill   ondansetron (ZOFRAN-ODT) 4 MG disintegrating tablet '4mg'$  ODT q4 hours prn nausea/vomit (Patient not taking: Reported on 08/05/2022) 10 tablet 0   oxyCODONE-acetaminophen (PERCOCET/ROXICET) 5-325 MG tablet Take 1 every 6 hours for pain not relieved by Tylenol or Motrin (Patient not taking: Reported on 08/05/2022) 20 tablet 0   No current facility-administered medications on file prior to visit.    No Known Allergies  Social History   Substance and Sexual Activity  Alcohol Use Never    Social History   Tobacco Use  Smoking Status Former   Types: Cigarettes  Smokeless Tobacco Never    Review of Systems  Constitutional: Negative.   HENT: Negative.    Eyes: Negative.   Respiratory: Negative.    Cardiovascular: Negative.   Gastrointestinal: Negative.   Genitourinary: Negative.   Musculoskeletal: Negative.   Skin: Negative.   Neurological: Negative.   Endo/Heme/Allergies: Negative.   Psychiatric/Behavioral: Negative.      Objective   Vitals:   08/05/22 1005  BP: (!) 130/90  Pulse: 96  Resp: 14  Temp: 98.1 F (36.7 C)  SpO2: 97%    Physical Exam Vitals reviewed.  Constitutional:      Appearance: Normal appearance. She is obese. She is not ill-appearing.  HENT:     Head: Normocephalic and atraumatic.  Eyes:     General: No scleral icterus. Cardiovascular:     Rate and Rhythm: Normal rate and regular  rhythm.     Heart sounds: Normal heart sounds. No murmur heard.    No friction rub. No gallop.  Pulmonary:     Effort: Pulmonary effort is normal. No respiratory distress.     Breath sounds: Normal breath sounds. No stridor. No wheezing, rhonchi or rales.  Abdominal:     General: Bowel sounds are normal. There is no distension.     Palpations: Abdomen is soft. There is no mass.     Tenderness: There is no abdominal tenderness. There is no  guarding or rebound.     Hernia: No hernia is present.  Skin:    General: Skin is warm and dry.  Neurological:     Mental Status: She is alert and oriented to person, place, and time.    ER notes reviewed.  Ultrasound report reviewed. Assessment  Biliary colic, cholelithiasis Plan  Patient is scheduled for robotic assisted laparoscopic cholecystectomy on 08/19/2022.  The risks and benefits of the procedure including bleeding, infection, hepatobiliary injury, and the possibility of an open procedure were fully explained to the patient, who gave informed consent.

## 2022-08-05 NOTE — H&P (Signed)
Stefanie Farmer; [id number]; 1996-05-26   HPI Patient is a 27 year old white female who was referred to my care by the emergency room and [facility] for evaluation treatment of cholelithiasis.  Patient is postpartum.  During her pregnancy, she did have episodes of epigastric pain and nausea.  She delivered without difficulty on 06/29/2022.  She ended up in the emergency room on 07/22/2022 with epigastric pain.  CT scan of the abdomen revealed cholelithiasis.  Her liver enzyme tests were within normal limits.  Since her discharge, she has been avoiding any fatty or greasy foods.  She denies any fever, chills, or jaundice.  She is not breast-feeding. Past Medical History:  Diagnosis Date   Hypertension    Kidney stone     Past Surgical History:  Procedure Laterality Date   CYSTOSCOPY W/ URETERAL STENT PLACEMENT Right 07/23/2021   Procedure: CYSTOSCOPY WITH RETROGRADE PYELOGRAM/URETERAL STENT PLACEMENT;  Surgeon: Cleon Gustin, MD;  Location: [facility];  Service: Urology;  Laterality: Right;   CYSTOSCOPY W/ URETERAL STENT PLACEMENT Right 08/13/2021   Procedure: CYSTOSCOPY WITH RETROGRADE PYELOGRAM/URETERAL STENT EXCHANGE;  Surgeon: Cleon Gustin, MD;  Location: [facility];  Service: Urology;  Laterality: Right;   EYE MUSCLE SURGERY     FOOT SURGERY     HOLMIUM LASER APPLICATION Right [id number]   Procedure: HOLMIUM LASER APPLICATION;  Surgeon: Cleon Gustin, MD;  Location: [facility];  Service: Urology;  Laterality: Right;   ORIF ANKLE FRACTURE Right 06/24/2020   Procedure: OPEN REDUCTION INTERNAL FIXATION (ORIF) ANKLE FRACTURE;  Surgeon: Carole Civil, MD;  Location: [facility];  Service: Orthopedics;  Laterality: Right;    Family History  Problem Relation Age of Onset   Osteoporosis Mother    Fibromyalgia Mother    Hypertension Father    Diabetes Maternal Grandmother    Crohn's disease Maternal Grandmother    Diabetes Maternal Grandfather    COPD Maternal Grandfather     Diabetes Paternal Grandmother     Current Outpatient Medications on File Prior to Visit  Medication Sig Dispense Refill   ondansetron (ZOFRAN-ODT) 4 MG disintegrating tablet '4mg'$  ODT q4 hours prn nausea/vomit (Patient not taking: Reported on 08/05/2022) 10 tablet 0   oxyCODONE-acetaminophen (PERCOCET/ROXICET) 5-325 MG tablet Take 1 every 6 hours for pain not relieved by Tylenol or Motrin (Patient not taking: Reported on 08/05/2022) 20 tablet 0   No current facility-administered medications on file prior to visit.    No Known Allergies  Social History   Substance and Sexual Activity  Alcohol Use Never    Social History   Tobacco Use  Smoking Status Former   Types: Cigarettes  Smokeless Tobacco Never    Review of Systems  Constitutional: Negative.   HENT: Negative.    Eyes: Negative.   Respiratory: Negative.    Cardiovascular: Negative.   Gastrointestinal: Negative.   Genitourinary: Negative.   Musculoskeletal: Negative.   Skin: Negative.   Neurological: Negative.   Endo/Heme/Allergies: Negative.   Psychiatric/Behavioral: Negative.      Objective   Vitals:   08/05/22 1005  BP: (!) 130/90  Pulse: 96  Resp: 14  Temp: 98.1 F (36.7 C)  SpO2: 97%    Physical Exam Vitals reviewed.  Constitutional:      Appearance: Normal appearance. She is obese. She is not ill-appearing.  HENT:     Head: Normocephalic and atraumatic.  Eyes:     General: No scleral icterus. Cardiovascular:     Rate and Rhythm: Normal rate and regular  rhythm.     Heart sounds: Normal heart sounds. No murmur heard.    No friction rub. No gallop.  Pulmonary:     Effort: Pulmonary effort is normal. No respiratory distress.     Breath sounds: Normal breath sounds. No stridor. No wheezing, rhonchi or rales.  Abdominal:     General: Bowel sounds are normal. There is no distension.     Palpations: Abdomen is soft. There is no mass.     Tenderness: There is no abdominal tenderness. There is no  guarding or rebound.     Hernia: No hernia is present.  Skin:    General: Skin is warm and dry.  Neurological:     Mental Status: She is alert and oriented to person, place, and time.    ER notes reviewed.  Ultrasound report reviewed. Assessment  Biliary colic, cholelithiasis Plan  Patient is scheduled for robotic assisted laparoscopic cholecystectomy on 08/19/2022.  The risks and benefits of the procedure including bleeding, infection, hepatobiliary injury, and the possibility of an open procedure were fully explained to the patient, who gave informed consent.

## 2022-08-13 ENCOUNTER — Encounter: Payer: Self-pay | Admitting: Advanced Practice Midwife

## 2022-08-13 ENCOUNTER — Other Ambulatory Visit: Payer: Self-pay | Admitting: Advanced Practice Midwife

## 2022-08-13 ENCOUNTER — Ambulatory Visit (INDEPENDENT_AMBULATORY_CARE_PROVIDER_SITE_OTHER): Payer: Medicaid Other | Admitting: Advanced Practice Midwife

## 2022-08-13 DIAGNOSIS — Z3202 Encounter for pregnancy test, result negative: Secondary | ICD-10-CM | POA: Diagnosis not present

## 2022-08-13 LAB — POCT URINE PREGNANCY: Preg Test, Ur: NEGATIVE

## 2022-08-13 MED ORDER — SLYND 4 MG PO TABS: 1.0000 | ORAL_TABLET | Freq: Every day | ORAL | 4 refills | Status: DC

## 2022-08-13 NOTE — Patient Instructions (Signed)
Use the website www.[email or web address].net for helpful postpartum resources!

## 2022-08-13 NOTE — Patient Instructions (Signed)
Stefanie Farmer  08/13/2022     @PREFPERIOPPHARMACY @   Your procedure is scheduled on  08/19/2022.   Report to [name] at  0700  A.M.   Call this number if you have problems the morning of surgery:  [phone]  If you experience any cold or flu symptoms such as cough, fever, chills, shortness of breath, etc. between now and your scheduled surgery, please notify us at the above number.   Remember:  Do not eat or drink after midnight.      Take these medicines the morning of surgery with A SIP OF WATER           zofran(if needed), oxycodone(if needed).     Do not wear jewelry, make-up or nail polish.  Do not wear lotions, powders, or perfumes, or deodorant.  Do not shave 48 hours prior to surgery.  Men may shave face and neck.  Do not bring valuables to the hospital.  [facility] is not responsible for any belongings or valuables.  Contacts, dentures or bridgework may not be worn into surgery.  Leave your suitcase in the car.  After surgery it may be brought to your room.  For patients admitted to the hospital, discharge time will be determined by your treatment team.  Patients discharged the day of surgery will not be allowed to drive home and must have someone with them for 24 hours.    Special instructions:      DO NOT smoke tobacco or vape for 24 hours before your procedure.   Please read over the following fact sheets that you were given. Pain Booklet, Coughing and Deep Breathing, Surgical Site Infection Prevention, Anesthesia Post-op Instructions, and Care and Recovery After Surgery        Minimally Invasive Cholecystectomy, Care After The following information offers guidance on how to care for yourself after your procedure. Your health care provider may also give you more specific instructions. If you have problems or questions, contact your health care provider. What can I expect after the procedure? After the procedure, it is common to  have: Pain at your incision sites. You will be given medicines to control this pain. Mild nausea or vomiting. Bloating and possible shoulder pain from the gas that was used during the procedure. Follow these instructions at home: Medicines Take over-the-counter and prescription medicines only as told by your health care provider. If you were prescribed an antibiotic medicine, take it as told by your health care provider. Do not stop using the antibiotic even if you start to feel better. Ask your health care provider if the medicine prescribed to you: Requires you to avoid driving or using machinery. Can cause constipation. You may need to take these actions to prevent or treat constipation: Drink enough fluid to keep your urine pale yellow. Take over-the-counter or prescription medicines. Eat foods that are high in fiber, such as beans, whole grains, and fresh fruits and vegetables. Limit foods that are high in fat and processed sugars, such as fried or sweet foods. Incision care  Follow instructions from your health care provider about how to take care of your incisions. Make sure you: Wash your hands with soap and water for at least 20 seconds before and after you change your bandage (dressing). If soap and water are not available, use hand sanitizer. Change your dressing as told by your health care provider. Leave stitches (sutures), skin glue, or adhesive strips in place. These skin closures  may need to be in place for 2 weeks or longer. If adhesive strip edges start to loosen and curl up, you may trim the loose edges. Do not remove adhesive strips completely unless your health care provider tells you to do that. Do not take baths, swim, or use a hot tub until your health care provider approves. Ask your health care provider if you may take showers. You may only be allowed to take sponge baths. Check your incision area every day for signs of infection. Check for: More redness, swelling, or  pain. Fluid or blood. Warmth. Pus or a bad smell. Activity Rest as told by your health care provider. Do not do activities that require a lot of effort. Avoid sitting for a long time without moving. Get up to take short walks every 1-2 hours. This is important to improve blood flow and breathing. Ask for help if you feel weak or unsteady. Do not lift anything that is heavier than 10 lb (4.5 kg), or the limit that you are told, until your health care provider says that it is safe. Do not play contact sports until your health care provider approves. Do not return to work or school until your health care provider approves. Return to your normal activities as told by your health care provider. Ask your health care provider what activities are safe for you. General instructions If you were given a sedative during the procedure, it can affect you for several hours. Do not drive or operate machinery until your health care provider says that it is safe. Keep all follow-up visits. This is important. Contact a health care provider if: You develop a rash. You have more redness, swelling, or pain around your incisions. You have fluid or blood coming from your incisions. Your incisions feel warm to the touch. You have pus or a bad smell coming from your incisions. You have a fever. One or more of your incisions breaks open. Get help right away if: You have trouble breathing. You have chest pain. You have more pain in your shoulders. You faint or feel dizzy when you stand. You have severe pain in your abdomen. You have nausea or vomiting that lasts for more than one day. You have leg pain that is new or unusual, or if it is localized to one specific spot. These symptoms may represent a serious problem that is an emergency. Do not wait to see if the symptoms will go away. Get medical help right away. Call your local emergency services (911 in the [place] Do not drive yourself to the  hospital. Summary After your procedure, it is common to have pain at the incision sites. You may also have nausea or bloating. Follow your health care provider's instructions about medicine, activity restrictions, and caring for your incision areas. Do not do activities that require a lot of effort. Contact a health care provider if you have a fever or other signs of infection, such as more redness, swelling, or pain around the incisions. Get help right away if you have chest pain, increasing pain in the shoulders, or trouble breathing. This information is not intended to replace advice given to you by your health care provider. Make sure you discuss any questions you have with your health care provider. Document Revised: 11/18/2020 Document Reviewed: 11/18/2020 Elsevier Patient Education  [place] Anesthesia, Adult, Care After The following information offers guidance on how to care for yourself after your procedure. Your health care provider may also  give you more specific instructions. If you have problems or questions, contact your health care provider. What can I expect after the procedure? After the procedure, it is common for people to: Have pain or discomfort at the IV site. Have nausea or vomiting. Have a sore throat or hoarseness. Have trouble concentrating. Feel cold or chills. Feel weak, sleepy, or tired (fatigue). Have soreness and body aches. These can affect parts of the body that were not involved in surgery. Follow these instructions at home: For the time period you were told by your health care provider:  Rest. Do not participate in activities where you could fall or become injured. Do not drive or use machinery. Do not drink alcohol. Do not take sleeping pills or medicines that cause drowsiness. Do not make important decisions or sign legal documents. Do not take care of children on your own. General instructions Drink enough fluid to keep your  urine pale yellow. If you have sleep apnea, surgery and certain medicines can increase your risk for breathing problems. Follow instructions from your health care provider about wearing your sleep device: Anytime you are sleeping, including during daytime naps. While taking prescription pain medicines, sleeping medicines, or medicines that make you drowsy. Return to your normal activities as told by your health care provider. Ask your health care provider what activities are safe for you. Take over-the-counter and prescription medicines only as told by your health care provider. Do not use any products that contain nicotine or tobacco. These products include cigarettes, chewing tobacco, and vaping devices, such as e-cigarettes. These can delay incision healing after surgery. If you need help quitting, ask your health care provider. Contact a health care provider if: You have nausea or vomiting that does not get better with medicine. You vomit every time you eat or drink. You have pain that does not get better with medicine. You cannot urinate or have bloody urine. You develop a skin rash. You have a fever. Get help right away if: You have trouble breathing. You have chest pain. You vomit blood. These symptoms may be an emergency. Get help right away. Call 911. Do not wait to see if the symptoms will go away. Do not drive yourself to the hospital. Summary After the procedure, it is common to have a sore throat, hoarseness, nausea, vomiting, or to feel weak, sleepy, or fatigue. For the time period you were told by your health care provider, do not drive or use machinery. Get help right away if you have difficulty breathing, have chest pain, or vomit blood. These symptoms may be an emergency. This information is not intended to replace advice given to you by your health care provider. Make sure you discuss any questions you have with your health care provider. Document Revised: 08/14/2021  Document Reviewed: 08/14/2021 Elsevier Patient Education  [place]. How to Use Chlorhexidine Before Surgery Chlorhexidine gluconate (CHG) is a germ-killing (antiseptic) solution that is used to clean the skin. It can get rid of the bacteria that normally live on the skin and can keep them away for about 24 hours. To clean your skin with CHG, you may be given: A CHG solution to use in the shower or as part of a sponge bath. A prepackaged cloth that contains CHG. Cleaning your skin with CHG may help lower the risk for infection: While you are staying in the intensive care unit of the hospital. If you have a vascular access, such as a central line, to provide short-term  or long-term access to your veins. If you have a catheter to drain urine from your bladder. If you are on a ventilator. A ventilator is a machine that helps you breathe by moving air in and out of your lungs. After surgery. What are the risks? Risks of using CHG include: A skin reaction. Hearing loss, if CHG gets in your ears and you have a perforated eardrum. Eye injury, if CHG gets in your eyes and is not rinsed out. The CHG product catching fire. Make sure that you avoid smoking and flames after applying CHG to your skin. Do not use CHG: If you have a chlorhexidine allergy or have previously reacted to chlorhexidine. On babies younger than 77 months of age. How to use CHG solution Use CHG only as told by your health care provider, and follow the instructions on the label. Use the full amount of CHG as directed. Usually, this is one bottle. During a shower Follow these steps when using CHG solution during a shower (unless your health care provider gives you different instructions): Start the shower. Use your normal soap and shampoo to wash your face and hair. Turn off the shower or move out of the shower stream. Pour the CHG onto a clean washcloth. Do not use any type of brush or rough-edged sponge. Starting at  your neck, lather your body down to your toes. Make sure you follow these instructions: If you will be having surgery, pay special attention to the part of your body where you will be having surgery. Scrub this area for at least 1 minute. Do not use CHG on your head or face. If the solution gets into your ears or eyes, rinse them well with water. Avoid your genital area. Avoid any areas of skin that have broken skin, cuts, or scrapes. Scrub your back and under your arms. Make sure to wash skin folds. Let the lather sit on your skin for 1-2 minutes or as long as told by your health care provider. Thoroughly rinse your entire body in the shower. Make sure that all body creases and crevices are rinsed well. Dry off with a clean towel. Do not put any substances on your body afterward--such as powder, lotion, or perfume--unless you are told to do so by your health care provider. Only use lotions that are recommended by the manufacturer. Put on clean clothes or pajamas. If it is the night before your surgery, sleep in clean sheets.  During a sponge bath Follow these steps when using CHG solution during a sponge bath (unless your health care provider gives you different instructions): Use your normal soap and shampoo to wash your face and hair. Pour the CHG onto a clean washcloth. Starting at your neck, lather your body down to your toes. Make sure you follow these instructions: If you will be having surgery, pay special attention to the part of your body where you will be having surgery. Scrub this area for at least 1 minute. Do not use CHG on your head or face. If the solution gets into your ears or eyes, rinse them well with water. Avoid your genital area. Avoid any areas of skin that have broken skin, cuts, or scrapes. Scrub your back and under your arms. Make sure to wash skin folds. Let the lather sit on your skin for 1-2 minutes or as long as told by your health care provider. Using a  different clean, wet washcloth, thoroughly rinse your entire body. Make sure that all body  creases and crevices are rinsed well. Dry off with a clean towel. Do not put any substances on your body afterward--such as powder, lotion, or perfume--unless you are told to do so by your health care provider. Only use lotions that are recommended by the manufacturer. Put on clean clothes or pajamas. If it is the night before your surgery, sleep in clean sheets. How to use CHG prepackaged cloths Only use CHG cloths as told by your health care provider, and follow the instructions on the label. Use the CHG cloth on clean, dry skin. Do not use the CHG cloth on your head or face unless your health care provider tells you to. When washing with the CHG cloth: Avoid your genital area. Avoid any areas of skin that have broken skin, cuts, or scrapes. Before surgery Follow these steps when using a CHG cloth to clean before surgery (unless your health care provider gives you different instructions): Using the CHG cloth, vigorously scrub the part of your body where you will be having surgery. Scrub using a back-and-forth motion for 3 minutes. The area on your body should be completely wet with CHG when you are done scrubbing. Do not rinse. Discard the cloth and let the area air-dry. Do not put any substances on the area afterward, such as powder, lotion, or perfume. Put on clean clothes or pajamas. If it is the night before your surgery, sleep in clean sheets.  For general bathing Follow these steps when using CHG cloths for general bathing (unless your health care provider gives you different instructions). Use a separate CHG cloth for each area of your body. Make sure you wash between any folds of skin and between your fingers and toes. Wash your body in the following order, switching to a new cloth after each step: The front of your neck, shoulders, and chest. Both of your arms, under your arms, and your  hands. Your stomach and groin area, avoiding the genitals. Your right leg and foot. Your left leg and foot. The back of your neck, your back, and your buttocks. Do not rinse. Discard the cloth and let the area air-dry. Do not put any substances on your body afterward--such as powder, lotion, or perfume--unless you are told to do so by your health care provider. Only use lotions that are recommended by the manufacturer. Put on clean clothes or pajamas. Contact a health care provider if: Your skin gets irritated after scrubbing. You have questions about using your solution or cloth. You swallow any chlorhexidine. Call your local poison control center (1-786-181-5146 in the [place] Get help right away if: Your eyes itch badly, or they become very red or swollen. Your skin itches badly and is red or swollen. Your hearing changes. You have trouble seeing. You have swelling or tingling in your mouth or throat. You have trouble breathing. These symptoms may represent a serious problem that is an emergency. Do not wait to see if the symptoms will go away. Get medical help right away. Call your local emergency services (911 in the [place] Do not drive yourself to the hospital. Summary Chlorhexidine gluconate (CHG) is a germ-killing (antiseptic) solution that is used to clean the skin. Cleaning your skin with CHG may help to lower your risk for infection. You may be given CHG to use for bathing. It may be in a bottle or in a prepackaged cloth to use on your skin. Carefully follow your health care provider's instructions and the instructions on the product label. Do  not use CHG if you have a chlorhexidine allergy. Contact your health care provider if your skin gets irritated after scrubbing. This information is not intended to replace advice given to you by your health care provider. Make sure you discuss any questions you have with your health care provider. Document Revised: 09/14/2021 Document  Reviewed: 07/28/2020 Elsevier Patient Education  [place].

## 2022-08-13 NOTE — Progress Notes (Signed)
POSTPARTUM VISIT Patient name: Stefanie Farmer MRN [id number]  Date of birth: Dec 05, 1995 Chief Complaint:   Postpartum Care (Thinking about birth control pill)  History of Present Illness:   Stefanie Farmer is a 27 y.o. G59P1011 Caucasian female being seen today for a postpartum visit. She is 6 weeks postpartum following a spontaneous vaginal delivery at 38.3 gestational weeks. IOL: yes, for gHTN v cHTN. Anesthesia: epidural.  Laceration: none.  Complications: during PP stay had severe epigastric pain w sludge on u/s. Inpatient contraception: no.   Pregnancy complicated by mild polyhydramnios; cHTN v gHTN . Tobacco use: former . Substance use disorder: no. Last pap smear: April 2023 and results were NILM w/ HRHPV not done. Next pap smear due: April 2026 No LMP recorded.  Postpartum course has been complicated by being dx with gallstones and having cholecystectomy on 08/19/22 . Bleeding none. Bowel function is  constipated at times . Bladder function is normal. Urinary incontinence? no, fecal incontinence? no Patient is sexually active. Last sexual activity:  2wks ago . Desired contraception: POPs. Patient does want a pregnancy in the future.  Desired family size is unsure number of children.   Upstream - 08/13/22 1110       Pregnancy Intention Screening   Does the patient want to become pregnant in the next year? Unsure    Does the patient's partner want to become pregnant in the next year? Unsure    Would the patient like to discuss contraceptive options today? Yes      Contraception Wrap Up   Current Method Female Condom    End Method Female Condom    Contraception Counseling Provided Yes            The pregnancy intention screening data noted above was reviewed. Potential methods of contraception were discussed. The patient elected to proceed with Female Condom.  Edinburgh Postpartum Depression Screening: negative  Edinburgh Postnatal Depression Scale - 08/13/22 1055        Edinburgh Postnatal Depression Scale:  In the Past 7 Days   I have looked forward with enjoyment to things. 0    I have blamed myself unnecessarily when things went wrong. 1    I have been anxious or worried for no good reason. 1    I have felt scared or panicky for no good reason. 1    Things have been getting on top of me. 0    I have been so unhappy that I have had difficulty sleeping. 0    I have felt sad or miserable. 0    I have been so unhappy that I have been crying. 0    The thought of harming myself has occurred to me. 0                06/14/2022   10:15 AM 01/01/2022   10:49 AM 09/15/2021   11:14 AM  GAD 7 : Generalized Anxiety Score  Nervous, Anxious, on Edge 1 0 1  Control/stop worrying 0 0 0  Worry too much - different things 0 0 0  Trouble relaxing 0 0 0  Restless 0 0 0  Easily annoyed or irritable 1 0 0  Afraid - awful might happen 0 0 0  Total GAD 7 Score 2 0 1  Anxiety Difficulty Not difficult at all       Baby's course has been uncomplicated. Baby is feeding by bottle. Infant has a pediatrician/family doctor? Yes.  Childcare strategy if returning to work/school: n/a-stay at  home mom.  Pt has material needs met for her and baby: Yes.   Review of Systems:   Pertinent items are noted in HPI Denies Abnormal vaginal discharge w/ itching/odor/irritation, headaches, visual changes, shortness of breath, chest pain, abdominal pain, severe nausea/vomiting, or problems with urination or bowel movements. Pertinent History Reviewed:  Reviewed past medical,surgical, obstetrical and family history.  Reviewed problem list, medications and allergies. OB History  Gravida Para Term Preterm AB Living  2 1 1   1 1   SAB IAB Ectopic Multiple Live Births  1     0 1    # Outcome Date GA Lbr Len/2nd Weight Sex Delivery Anes PTL Lv  2 Term 07/01/22 [redacted]w[redacted]d 16:08 / 00:48 6 lb 7 oz (2.92 kg) F Vag-Spont EPI  LIV     Birth Comments: WDL  1 SAB 05/2016           Physical Assessment:    Vitals:   08/13/22 1048 08/13/22 1116  BP: (!) 140/78 130/81  Pulse: 87 89  Weight: (!) 306 lb 6.4 oz (139 kg)   Height: 5\' 4"  (1.626 m)   Body mass index is 52.59 kg/m.       Physical Examination:   General appearance: alert, well appearing, and in no distress  Mental status: alert, oriented to person, place, and time  Skin: warm & dry   Cardiovascular: normal heart rate noted   Respiratory: normal respiratory effort, no distress   Breasts: deferred, no complaints   Abdomen: soft, non-tender   Pelvic: examination not indicated. Thin prep pap obtained: No  Rectal: not examined  Extremities: Edema: Trace         Results for orders placed or performed in visit on 08/13/22 (from the past 24 hour(s))  POCT urine pregnancy   Collection Time: 08/13/22 11:32 AM  Result Value Ref Range   Preg Test, Ur Negative Negative    Assessment & Plan:  1) Postpartum exam 2) Six wks s/p spontaneous vaginal delivery 3) bottle feeding 4) Depression screening: neg/min; no concerns 5) Contraception management: rx Slynd; to start now with back-up x first pack 6) s/p gHTN/cHTN: first BP ^ due to being anxious re the baby potentially crying; 2nd value nl; rec start care w PCP ([place]) 7) Upcoming cholecystectomy 08/19/22  Essential components of care per ACOG recommendations:  1.  Mood and well being:  If positive depression screen, discussed and plan developed.  If using tobacco we discussed reduction/cessation and risk of relapse If current substance abuse, we discussed and referral to local resources was offered.   2. Infant care and feeding:  If breastfeeding, discussed returning to work, pumping, breastfeeding-associated pain, guidance regarding return to fertility while lactating if not using another method. If needed, patient was provided with a letter to be allowed to pump q 2-3hrs to support lactation in a private location with access to a refrigerator to store breastmilk.    Recommended that all caregivers be immunized for flu, pertussis and other preventable communicable diseases If pt does not have material needs met for her/baby, referred to local resources for help obtaining these.  3. Sexuality, contraception and birth spacing Provided guidance regarding sexuality, management of dyspareunia, and resumption of intercourse Discussed avoiding interpregnancy interval <53mths and recommended birth spacing of 18 months  4. Sleep and fatigue Discussed coping options for fatigue and sleep disruption Encouraged family/partner/community support of 4 hrs of uninterrupted sleep to help with mood and fatigue  5. Physical recovery  If pt had a C/S, assessed incisional pain and providing guidance on normal vs prolonged recovery If pt had a laceration, perineal healing and pain reviewed.  If urinary or fecal incontinence, discussed management and referred to PT or uro/gyn if indicated  Patient is safe to resume physical activity. Discussed attainment of healthy weight.  6.  Chronic disease management Discussed pregnancy complications if any, and their implications for future childbearing and long-term maternal health. Review recommendations for prevention of recurrent pregnancy complications, such as 17 hydroxyprogesterone caproate to reduce risk for recurrent PTB not applicable, or aspirin to reduce risk of preeclampsia yes. Pt had GDM: no. If yes, 2hr GTT scheduled: not applicable. Reviewed medications and non-pregnant dosing including consideration of whether pt is breastfeeding using a reliable resource such as LactMed: not applicable Referred for f/u w/ PCP or subspecialist providers as indicated: probably going to go to [place] for PCP  7. Health maintenance Mammogram at 25yo or earlier if indicated Pap smears as indicated  Meds:  Meds ordered this encounter  Medications   Drospirenone (SLYND) 4 MG TABS    Sig: Take 1 tablet (4 mg total) by mouth  daily.    Dispense:  84 tablet    Refill:  4    Order Specific Question:   Supervising Provider    Answer:   Janyth Pupa [id number]    Follow-up: Return for prn.   Orders Placed This Encounter  Procedures   POCT urine pregnancy    Myrtis Ser [facility] 08/13/2022 12:01 PM

## 2022-08-16 ENCOUNTER — Other Ambulatory Visit: Payer: Self-pay

## 2022-08-16 ENCOUNTER — Emergency Department (HOSPITAL_COMMUNITY)
Admission: EM | Admit: 2022-08-16 | Discharge: 2022-08-16 | Disposition: A | Discharge status: Home / Self Care | Payer: Medicaid Other | Attending: Emergency Medicine | Admitting: Emergency Medicine

## 2022-08-16 ENCOUNTER — Encounter (HOSPITAL_COMMUNITY): Payer: Self-pay | Admitting: Emergency Medicine

## 2022-08-16 DIAGNOSIS — K8050 Calculus of bile duct without cholangitis or cholecystitis without obstruction: Secondary | ICD-10-CM

## 2022-08-16 DIAGNOSIS — K8020 Calculus of gallbladder without cholecystitis without obstruction: Secondary | ICD-10-CM | POA: Diagnosis not present

## 2022-08-16 DIAGNOSIS — R1011 Right upper quadrant pain: Secondary | ICD-10-CM | POA: Diagnosis present

## 2022-08-16 LAB — COMPREHENSIVE METABOLIC PANEL
ALT: 45 U/L — ABNORMAL HIGH (ref 0–44)
AST: 50 U/L — ABNORMAL HIGH (ref 15–41)
Albumin: 3.8 g/dL (ref 3.5–5.0)
Alkaline Phosphatase: 73 U/L (ref 38–126)
Anion gap: 10 (ref 5–15)
BUN: 10 mg/dL (ref 6–20)
CO2: 23 mmol/L (ref 22–32)
Calcium: 9 mg/dL (ref 8.9–10.3)
Chloride: 104 mmol/L (ref 98–111)
Creatinine, Ser: 0.79 mg/dL (ref 0.44–1.00)
GFR, Estimated: >60 mL/min (ref >60)
Glucose, Bld: 114 mg/dL — ABNORMAL HIGH (ref 70–99)
Potassium: 3.4 mmol/L — ABNORMAL LOW (ref 3.5–5.1)
Sodium: 137 mmol/L (ref 135–145)
Total Bilirubin: 0.5 mg/dL (ref 0.3–1.2)
Total Protein: 7.1 g/dL (ref 6.5–8.1)

## 2022-08-16 LAB — CBC WITH DIFFERENTIAL/PLATELET
Abs Immature Granulocytes: 0.02 10*3/uL (ref 0.00–0.07)
Basophils Absolute: 0 10*3/uL (ref 0.0–0.1)
Basophils Relative: 0 %
Eosinophils Absolute: 0 10*3/uL (ref 0.0–0.5)
Eosinophils Relative: 0 %
HCT: 36.9 % (ref 36.0–46.0)
Hemoglobin: 11.8 g/dL — ABNORMAL LOW (ref 12.0–15.0)
Immature Granulocytes: 0 %
Lymphocytes Relative: 29 %
Lymphs Abs: 2.2 10*3/uL (ref 0.7–4.0)
MCH: 27.8 pg (ref 26.0–34.0)
MCHC: 32 g/dL (ref 30.0–36.0)
MCV: 86.8 fL (ref 80.0–100.0)
Monocytes Absolute: 0.4 10*3/uL (ref 0.1–1.0)
Monocytes Relative: 5 %
Neutro Abs: 5.1 10*3/uL (ref 1.7–7.7)
Neutrophils Relative %: 66 %
Platelets: 303 10*3/uL (ref 150–400)
RBC: 4.25 MIL/uL (ref 3.87–5.11)
RDW: 13.1 % (ref 11.5–15.5)
WBC: 7.7 10*3/uL (ref 4.0–10.5)
nRBC: 0 % (ref 0.0–0.2)

## 2022-08-16 LAB — LIPASE, BLOOD: Lipase: 34 U/L (ref 11–51)

## 2022-08-16 MED ORDER — HYDROMORPHONE HCL 1 MG/ML IJ SOLN
1.0000 mg | Freq: Once | INTRAMUSCULAR | Status: AC
  Administered 2022-08-16: 1 mg via INTRAVENOUS
  Filled 2022-08-16: qty 1

## 2022-08-16 MED ORDER — SODIUM CHLORIDE 0.9 % IV BOLUS
1000.0000 mL | Freq: Once | INTRAVENOUS | Status: AC
  Administered 2022-08-16: 1000 mL via INTRAVENOUS

## 2022-08-16 MED ORDER — ONDANSETRON HCL 4 MG/2ML IJ SOLN
4.0000 mg | Freq: Once | INTRAMUSCULAR | Status: AC
  Administered 2022-08-16: 4 mg via INTRAVENOUS
  Filled 2022-08-16: qty 2

## 2022-08-16 NOTE — ED Triage Notes (Signed)
Pt with c/o RUQ abdominal pain. States it's her gallbladder. States pain started an hour ago.

## 2022-08-16 NOTE — ED Provider Notes (Signed)
[facility]  Provider Note  CSN: [id number] Arrival date & time: 08/16/22 0051  History Chief Complaint  Patient presents with   Abdominal Pain    Stefanie Farmer is a 27 y.o. female with known gall stones is scheduled for elective lap chole in 4 days. She reports onset of severe RUQ pain and vomiting a short time prior to arrival. No fever.    Home Medications Prior to Admission medications   Medication Sig Start Date End Date Taking? Authorizing Provider  acetaminophen (TYLENOL) 650 MG CR tablet Take 650 mg by mouth every 8 (eight) hours as needed for pain.    [provider]  benzocaine (ORAJEL) 10 % mucosal gel Use as directed 1 Application in the mouth or throat as needed for mouth pain.    [provider]  Drospirenone (SLYND) 4 MG TABS Take 1 tablet (4 mg total) by mouth daily. 08/13/22   Myrtis Ser, CNM  ondansetron (ZOFRAN-ODT) 4 MG disintegrating tablet 4mg  ODT q4 hours prn nausea/vomit 07/22/22   Milton Ferguson, MD  oxyCODONE-acetaminophen (PERCOCET/ROXICET) 5-325 MG tablet Take 1 every 6 hours for pain not relieved by Tylenol or Motrin Patient taking differently: Take 1 tablet by mouth every 6 (six) hours as needed (gallbladder attack pain.). 07/22/22   Milton Ferguson, MD     Allergies    Patient has no known allergies.   Review of Systems   Review of Systems Please see HPI for pertinent positives and negatives  Physical Exam BP (!) 129/90   Pulse 84   Temp 97.8 F (36.6 C) (Oral)   Resp 18   Ht 5\' 4"  (1.626 m)   Wt (!) 139 kg   SpO2 100%   Breastfeeding No   BMI 52.60 kg/m   Physical Exam Vitals and nursing note reviewed.  Constitutional:      Appearance: Normal appearance.  HENT:     Head: Normocephalic and atraumatic.     Nose: Nose normal.     Mouth/Throat:     Mouth: Mucous membranes are moist.  Eyes:     Extraocular Movements: Extraocular movements intact.      Conjunctiva/sclera: Conjunctivae normal.  Cardiovascular:     Rate and Rhythm: Normal rate.  Pulmonary:     Effort: Pulmonary effort is normal.     Breath sounds: Normal breath sounds.  Abdominal:     General: Abdomen is flat.     Palpations: Abdomen is soft.     Tenderness: There is abdominal tenderness in the right upper quadrant and epigastric area. There is no guarding. Negative signs include Murphy's sign and McBurney's sign.  Musculoskeletal:        General: No swelling. Normal range of motion.     Cervical back: Neck supple.  Skin:    General: Skin is warm and dry.  Neurological:     General: No focal deficit present.     Mental Status: She is alert.  Psychiatric:        Mood and Affect: Mood normal.     ED Results / Procedures / Treatments   EKG None  Procedures Procedures  Medications Ordered in the ED Medications  HYDROmorphone (DILAUDID) injection 1 mg (1 mg Intravenous Given 08/16/22 0144)  ondansetron (ZOFRAN) injection 4 mg (4 mg Intravenous Given 08/16/22 0144)  sodium chloride 0.9 % bolus 1,000 mL (1,000 mLs Intravenous New Bag/Given 08/16/22 0144)    Initial Impression and Plan  Patient with known gall  stones, here for RUQ and epigastric pain. Consider cholecystitis, biliary colic or choledocholithiasis/pancreatitis. Will check labs, pain and nausea meds for comfort.   ED Course   Clinical Course as of 08/16/22 0240  Mon Aug 16, 2022  0133 CBC with normal WBC.  [CS]  [id number] CMP with mildly elevated AST/ALT similar to previous but normal Lipase, Bili and ALP. No signs of acute biliary obstruction.  [CS]  [id number] Patient feeling better, comfortable going home. Recommend she continue with her already scheduled pre-op planning. RTED for any other concerns.  [CS]    Clinical Course User Index [CS] Truddie Hidden, MD     MDM Rules/Calculators/A&P Medical Decision Making Given presenting complaint, I considered that admission might be necessary. After  review of results from ED lab and/or imaging studies, admission to the hospital is not indicated at this time.    Problems Addressed: Biliary colic: acute illness or injury  Amount and/or Complexity of Data Reviewed Labs: ordered. Decision-making details documented in ED Course.  Risk Prescription drug management. Parenteral controlled substances. Decision regarding hospitalization.     Final Clinical Impression(s) / ED Diagnoses Final diagnoses:  Biliary colic    Rx / DC Orders ED Discharge Orders     None        Truddie Hidden, MD 08/16/22 [phone]

## 2022-08-17 ENCOUNTER — Encounter (HOSPITAL_COMMUNITY)
Admission: RE | Admit: 2022-08-17 | Discharge: 2022-08-17 | Disposition: A | Discharge status: Home / Self Care | Payer: Medicaid Other | Source: Ambulatory Visit | Attending: General Surgery | Admitting: General Surgery

## 2022-08-17 ENCOUNTER — Encounter (HOSPITAL_COMMUNITY): Payer: Self-pay

## 2022-08-17 DIAGNOSIS — Z6841 Body Mass Index (BMI) 40.0 and over, adult: Secondary | ICD-10-CM | POA: Diagnosis not present

## 2022-08-17 DIAGNOSIS — Z01810 Encounter for preprocedural cardiovascular examination: Secondary | ICD-10-CM | POA: Insufficient documentation

## 2022-08-17 DIAGNOSIS — Z01818 Encounter for other preprocedural examination: Secondary | ICD-10-CM

## 2022-08-17 HISTORY — DX: Personal history of urinary calculi: Z87.442

## 2022-08-19 ENCOUNTER — Other Ambulatory Visit: Payer: Self-pay

## 2022-08-19 ENCOUNTER — Ambulatory Visit (HOSPITAL_BASED_OUTPATIENT_CLINIC_OR_DEPARTMENT_OTHER): Payer: Medicaid Other | Admitting: Certified Registered"

## 2022-08-19 ENCOUNTER — Ambulatory Visit (HOSPITAL_COMMUNITY): Payer: Medicaid Other | Admitting: Certified Registered"

## 2022-08-19 ENCOUNTER — Encounter (HOSPITAL_COMMUNITY)
Admission: RE | Disposition: A | Discharge status: Home / Self Care | Payer: Self-pay | Source: Home / Self Care | Attending: General Surgery

## 2022-08-19 ENCOUNTER — Ambulatory Visit (HOSPITAL_COMMUNITY)
Admission: RE | Admit: 2022-08-19 | Discharge: 2022-08-19 | Disposition: A | Discharge status: Home / Self Care | Payer: Medicaid Other | Attending: General Surgery | Admitting: General Surgery

## 2022-08-19 DIAGNOSIS — Z87891 Personal history of nicotine dependence: Secondary | ICD-10-CM

## 2022-08-19 DIAGNOSIS — I10 Essential (primary) hypertension: Secondary | ICD-10-CM

## 2022-08-19 DIAGNOSIS — Z6841 Body Mass Index (BMI) 40.0 and over, adult: Secondary | ICD-10-CM | POA: Insufficient documentation

## 2022-08-19 DIAGNOSIS — K8010 Calculus of gallbladder with chronic cholecystitis without obstruction: Secondary | ICD-10-CM | POA: Diagnosis present

## 2022-08-19 DIAGNOSIS — N289 Disorder of kidney and ureter, unspecified: Secondary | ICD-10-CM

## 2022-08-19 DIAGNOSIS — K8020 Calculus of gallbladder without cholecystitis without obstruction: Secondary | ICD-10-CM

## 2022-08-19 DIAGNOSIS — K8070 Calculus of gallbladder and bile duct without cholecystitis without obstruction: Secondary | ICD-10-CM | POA: Diagnosis not present

## 2022-08-19 DIAGNOSIS — Z01818 Encounter for other preprocedural examination: Secondary | ICD-10-CM

## 2022-08-19 SURGERY — CHOLECYSTECTOMY, ROBOT-ASSISTED, LAPAROSCOPIC
Anesthesia: General | Site: Abdomen

## 2022-08-19 MED ORDER — CEFAZOLIN IN SODIUM CHLORIDE 3-0.9 GM/100ML-% IV SOLN: 3.0000 g | INTRAVENOUS | Status: DC

## 2022-08-19 MED ORDER — CHLORHEXIDINE GLUCONATE 0.12 % MT SOLN
15.0000 mL | Freq: Once | OROMUCOSAL | Status: AC
  Administered 2022-08-19: 15 mL via OROMUCOSAL

## 2022-08-19 MED ORDER — DEXAMETHASONE SODIUM PHOSPHATE 4 MG/ML IJ SOLN
INTRAMUSCULAR | Status: DC | PRN
  Administered 2022-08-19: 5 mg via INTRAVENOUS

## 2022-08-19 MED ORDER — ONDANSETRON HCL 4 MG/2ML IJ SOLN
INTRAMUSCULAR | Status: DC | PRN
  Administered 2022-08-19: 4 mg via INTRAVENOUS

## 2022-08-19 MED ORDER — FENTANYL CITRATE (PF) 250 MCG/5ML IJ SOLN
INTRAMUSCULAR | Status: AC
  Filled 2022-08-19: qty 5

## 2022-08-19 MED ORDER — CEFAZOLIN IN SODIUM CHLORIDE 3-0.9 GM/100ML-% IV SOLN
INTRAVENOUS | Status: AC
  Filled 2022-08-19: qty 100

## 2022-08-19 MED ORDER — HYDROMORPHONE HCL 1 MG/ML IJ SOLN
0.2500 mg | INTRAMUSCULAR | Status: DC | PRN
  Administered 2022-08-19 (×2): 0.5 mg via INTRAVENOUS
  Filled 2022-08-19 (×2): qty 0.5

## 2022-08-19 MED ORDER — SCOPOLAMINE 1 MG/3DAYS TD PT72
MEDICATED_PATCH | TRANSDERMAL | Status: AC
  Filled 2022-08-19: qty 1

## 2022-08-19 MED ORDER — PHENYLEPHRINE 80 MCG/ML (10ML) SYRINGE FOR IV PUSH (FOR BLOOD PRESSURE SUPPORT)
PREFILLED_SYRINGE | INTRAVENOUS | Status: DC | PRN
  Administered 2022-08-19 (×2): 80 ug via INTRAVENOUS

## 2022-08-19 MED ORDER — SCOPOLAMINE 1 MG/3DAYS TD PT72
1.0000 | MEDICATED_PATCH | Freq: Once | TRANSDERMAL | Status: DC
  Administered 2022-08-19: 1.5 mg via TRANSDERMAL

## 2022-08-19 MED ORDER — LACTATED RINGERS IV SOLN: INTRAVENOUS | Status: DC

## 2022-08-19 MED ORDER — INDOCYANINE GREEN 25 MG IV SOLR
INTRAVENOUS | Status: AC
  Filled 2022-08-19: qty 10

## 2022-08-19 MED ORDER — ROCURONIUM BROMIDE 10 MG/ML (PF) SYRINGE
PREFILLED_SYRINGE | INTRAVENOUS | Status: DC | PRN
  Administered 2022-08-19: 50 mg via INTRAVENOUS
  Administered 2022-08-19: 20 mg via INTRAVENOUS

## 2022-08-19 MED ORDER — SUCCINYLCHOLINE CHLORIDE 200 MG/10ML IV SOSY
PREFILLED_SYRINGE | INTRAVENOUS | Status: DC | PRN
  Administered 2022-08-19: 100 mg via INTRAVENOUS

## 2022-08-19 MED ORDER — OXYCODONE-ACETAMINOPHEN 5-325 MG PO TABS: 1.0000 | ORAL_TABLET | ORAL | 0 refills | Status: DC | PRN

## 2022-08-19 MED ORDER — BUPIVACAINE LIPOSOME 1.3 % IJ SUSP
INTRAMUSCULAR | Status: AC
  Filled 2022-08-19: qty 20

## 2022-08-19 MED ORDER — ROCURONIUM BROMIDE 10 MG/ML (PF) SYRINGE
PREFILLED_SYRINGE | INTRAVENOUS | Status: AC
  Filled 2022-08-19: qty 10

## 2022-08-19 MED ORDER — DEXMEDETOMIDINE HCL IN NACL 80 MCG/20ML IV SOLN
INTRAVENOUS | Status: DC | PRN
  Administered 2022-08-19: 12 ug via BUCCAL
  Administered 2022-08-19 (×3): 4 ug via BUCCAL
  Administered 2022-08-19 (×2): 8 ug via BUCCAL

## 2022-08-19 MED ORDER — LIDOCAINE HCL (PF) 2 % IJ SOLN
INTRAMUSCULAR | Status: AC
  Filled 2022-08-19: qty 5

## 2022-08-19 MED ORDER — STERILE WATER FOR IRRIGATION IR SOLN
Status: DC | PRN
  Administered 2022-08-19: 500 mL

## 2022-08-19 MED ORDER — MIDAZOLAM HCL 2 MG/2ML IJ SOLN
INTRAMUSCULAR | Status: AC
  Filled 2022-08-19: qty 2

## 2022-08-19 MED ORDER — FENTANYL CITRATE (PF) 250 MCG/5ML IJ SOLN
INTRAMUSCULAR | Status: DC | PRN
  Administered 2022-08-19 (×5): 50 ug via INTRAVENOUS

## 2022-08-19 MED ORDER — MEPERIDINE HCL 50 MG/ML IJ SOLN: 6.2500 mg | INTRAMUSCULAR | Status: DC | PRN

## 2022-08-19 MED ORDER — DEXMEDETOMIDINE HCL IN NACL 80 MCG/20ML IV SOLN
INTRAVENOUS | Status: AC
  Filled 2022-08-19: qty 20

## 2022-08-19 MED ORDER — SUGAMMADEX SODIUM 200 MG/2ML IV SOLN
INTRAVENOUS | Status: DC | PRN
  Administered 2022-08-19: 400 mg via INTRAVENOUS

## 2022-08-19 MED ORDER — BUPIVACAINE LIPOSOME 1.3 % IJ SUSP
INTRAMUSCULAR | Status: DC | PRN
  Administered 2022-08-19: 20 mL

## 2022-08-19 MED ORDER — ONDANSETRON HCL 4 MG/2ML IJ SOLN: 4.0000 mg | Freq: Once | INTRAMUSCULAR | Status: DC | PRN

## 2022-08-19 MED ORDER — ORAL CARE MOUTH RINSE: 15.0000 mL | Freq: Once | OROMUCOSAL | Status: AC

## 2022-08-19 MED ORDER — LIDOCAINE 2% (20 MG/ML) 5 ML SYRINGE
INTRAMUSCULAR | Status: DC | PRN
  Administered 2022-08-19: 100 mg via INTRAVENOUS

## 2022-08-19 MED ORDER — KETOROLAC TROMETHAMINE 30 MG/ML IJ SOLN
30.0000 mg | Freq: Once | INTRAMUSCULAR | Status: AC
  Administered 2022-08-19: 30 mg via INTRAVENOUS
  Filled 2022-08-19: qty 1

## 2022-08-19 MED ORDER — CHLORHEXIDINE GLUCONATE CLOTH 2 % EX PADS: 6.0000 | MEDICATED_PAD | Freq: Once | CUTANEOUS | Status: DC

## 2022-08-19 MED ORDER — MIDAZOLAM HCL 2 MG/2ML IJ SOLN
INTRAMUSCULAR | Status: DC | PRN
  Administered 2022-08-19: 2 mg via INTRAVENOUS

## 2022-08-19 MED ORDER — ONDANSETRON HCL 4 MG/2ML IJ SOLN
INTRAMUSCULAR | Status: AC
  Filled 2022-08-19: qty 2

## 2022-08-19 MED ORDER — DEXTROSE 5 % IV SOLN
INTRAVENOUS | Status: DC | PRN
  Administered 2022-08-19: 3 g via INTRAVENOUS

## 2022-08-19 MED ORDER — PHENYLEPHRINE 80 MCG/ML (10ML) SYRINGE FOR IV PUSH (FOR BLOOD PRESSURE SUPPORT)
PREFILLED_SYRINGE | INTRAVENOUS | Status: AC
  Filled 2022-08-19: qty 10

## 2022-08-19 MED ORDER — PROPOFOL 10 MG/ML IV BOLUS
INTRAVENOUS | Status: AC
  Filled 2022-08-19: qty 20

## 2022-08-19 MED ORDER — PROPOFOL 10 MG/ML IV BOLUS
INTRAVENOUS | Status: DC | PRN
  Administered 2022-08-19: 160 mg via INTRAVENOUS

## 2022-08-19 MED ORDER — INDOCYANINE GREEN 25 MG IV SOLR
2.5000 mg | Freq: Once | INTRAVENOUS | Status: AC
  Administered 2022-08-19: 2.5 mg via INTRAVENOUS
  Filled 2022-08-19: qty 10

## 2022-08-19 MED ORDER — DEXAMETHASONE SODIUM PHOSPHATE 10 MG/ML IJ SOLN
INTRAMUSCULAR | Status: AC
  Filled 2022-08-19: qty 1

## 2022-08-19 SURGICAL SUPPLY — 39 items
ADH SKN CLS APL DERMABOND .7 (GAUZE/BANDAGES/DRESSINGS) ×1
APL PRP STRL LF DISP 70% ISPRP (MISCELLANEOUS) ×1
CHLORAPREP W/TINT 26 (MISCELLANEOUS) ×1 IMPLANT
CLIP LIGATING HEM O LOK PURPLE (MISCELLANEOUS) ×1 IMPLANT
COVER TIP SHEARS 8 DVNC (MISCELLANEOUS) ×1 IMPLANT
COVER TIP SHEARS 8MM DA VINCI (MISCELLANEOUS) ×1
DEFOGGER SCOPE WARMER CLEARIFY (MISCELLANEOUS) IMPLANT
DERMABOND ADVANCED .7 DNX12 (GAUZE/BANDAGES/DRESSINGS) ×1 IMPLANT
DRAPE ARM DVNC X/XI (DISPOSABLE) ×4 IMPLANT
DRAPE COLUMN DVNC XI (DISPOSABLE) ×1 IMPLANT
DRAPE DA VINCI XI ARM (DISPOSABLE) ×4
DRAPE DA VINCI XI COLUMN (DISPOSABLE) ×1
DRAPE HALF SHEET 40X57 (DRAPES) ×1 IMPLANT
ELECT REM PT RETURN 9FT ADLT (ELECTROSURGICAL) ×1
ELECTRODE REM PT RTRN 9FT ADLT (ELECTROSURGICAL) ×1 IMPLANT
GLOVE BIO SURGEON STRL SZ7 (GLOVE) IMPLANT
GLOVE BIOGEL PI IND STRL 7.0 (GLOVE) ×2 IMPLANT
GLOVE SURG SS PI 7.5 STRL IVOR (GLOVE) ×2 IMPLANT
GOWN STRL REUS W/TWL LRG LVL3 (GOWN DISPOSABLE) ×3 IMPLANT
KIT TURNOVER KIT A (KITS) ×1 IMPLANT
MANIFOLD NEPTUNE II (INSTRUMENTS) ×1 IMPLANT
NDL HYPO 21X1.5 SAFETY (NEEDLE) ×1 IMPLANT
NDL INSUFFLATION 14GA 120MM (NEEDLE) ×1 IMPLANT
NEEDLE HYPO 21X1.5 SAFETY (NEEDLE) ×1 IMPLANT
NEEDLE INSUFFLATION 14GA 120MM (NEEDLE) ×1 IMPLANT
OBTURATOR OPTICAL STANDARD 8MM (TROCAR) ×1
OBTURATOR OPTICAL STND 8 DVNC (TROCAR) ×1
OBTURATOR OPTICALSTD 8 DVNC (TROCAR) ×1 IMPLANT
PACK LAP CHOLE LZT030E (CUSTOM PROCEDURE TRAY) ×1 IMPLANT
PAD ARMBOARD 7.5X6 YLW CONV (MISCELLANEOUS) ×1 IMPLANT
PENCIL HANDSWITCHING (ELECTRODE) ×1 IMPLANT
SEAL CANN UNIV 5-8 DVNC XI (MISCELLANEOUS) ×4 IMPLANT
SEAL XI 5MM-8MM UNIVERSAL (MISCELLANEOUS) ×4
SET TUBE SMOKE EVAC HIGH FLOW (TUBING) ×1 IMPLANT
SUT MNCRL AB 4-0 PS2 18 (SUTURE) ×2 IMPLANT
SUT VICRYL 0 AB UR-6 (SUTURE) ×1 IMPLANT
SYS RETRIEVAL 5MM INZII UNIV (BASKET) ×1
SYSTEM RETRIEVL 5MM INZII UNIV (BASKET) ×1 IMPLANT
WATER STERILE IRR 500ML POUR (IV SOLUTION) ×1 IMPLANT

## 2022-08-19 NOTE — Anesthesia Postprocedure Evaluation (Signed)
Anesthesia Post Note  Patient: Stefanie Farmer  Procedure(s) Performed: XI ROBOTIC ASSISTED LAPAROSCOPIC CHOLECYSTECTOMY (Abdomen)  Patient location during evaluation: Phase II Anesthesia Type: General Level of consciousness: awake and alert and oriented Pain management: pain level controlled Vital Signs Assessment: post-procedure vital signs reviewed and stable Respiratory status: spontaneous breathing, nonlabored ventilation and respiratory function stable Cardiovascular status: blood pressure returned to baseline and stable Postop Assessment: no apparent nausea or vomiting Anesthetic complications: no  No notable events documented.   Last Vitals:  Vitals:   08/19/22 1115 08/19/22 1135  BP: 127/70 122/70  Pulse: 66 (!) 59  Resp: 17 (!) 8  Temp:  (!) 36.4 C  SpO2: 97% 100%    Last Pain:  Vitals:   08/19/22 1135  TempSrc: Oral  PainSc: 4                  Dwan Hemmelgarn C Madhavi Hamblen

## 2022-08-19 NOTE — Transfer of Care (Signed)
Immediate Anesthesia Transfer of Care Note  Patient: Stefanie Farmer  Procedure(s) Performed: XI ROBOTIC ASSISTED LAPAROSCOPIC CHOLECYSTECTOMY (Abdomen)  Patient Location: PACU  Anesthesia Type:General  Level of Consciousness: awake  Airway & Oxygen Therapy: Patient Spontanous Breathing and Patient connected to face mask oxygen  Post-op Assessment: Report given to RN and Post -op Vital signs reviewed and stable  Post vital signs: Reviewed and stable  Last Vitals:  Vitals Value Taken Time  BP    Temp    Pulse 73 08/19/22 1023  Resp 15 08/19/22 1023  SpO2 100 % 08/19/22 1023  Vitals shown include unvalidated device data.  Last Pain:  Vitals:   08/19/22 0756  PainSc: 0-No pain         Complications: No notable events documented.

## 2022-08-19 NOTE — Interval H&P Note (Signed)
History and Physical Interval Note:  08/19/2022 8:07 AM  Stefanie Farmer  has presented today for surgery, with the diagnosis of CHOLELITHIASIS.  The various methods of treatment have been discussed with the patient and family. After consideration of risks, benefits and other options for treatment, the patient has consented to  Procedure(s): XI ROBOTIC [place] (N/A) as a surgical intervention.  The patient's history has been reviewed, patient examined, no change in status, stable for surgery.  I have reviewed the patient's chart and labs.  Questions were answered to the patient's satisfaction.     Aviva Signs

## 2022-08-19 NOTE — Anesthesia Procedure Notes (Addendum)
Procedure Name: Intubation Date/Time: 08/19/2022 9:08 AM  Performed by: Orlie Dakin, CRNAPre-anesthesia Checklist: Patient identified, Emergency Drugs available, Suction available, Patient being monitored and Timeout performed Patient Re-evaluated:Patient Re-evaluated prior to induction Oxygen Delivery Method: Circle system utilized Preoxygenation: Pre-oxygenation with 100% oxygen Induction Type: IV induction Ventilation: Mask ventilation without difficulty Laryngoscope Size: Mac and 3 Grade View: Grade I Tube type: Oral Tube size: 7.5 mm Number of attempts: 1 Airway Equipment and Method: Patient positioned with wedge pillow and Stylet Placement Confirmation: ETT inserted through vocal cords under direct vision, positive ETCO2 and breath sounds checked- equal and bilateral Secured at: 21 cm Tube secured with: Tape Dental Injury: Teeth and Oropharynx as per pre-operative assessment

## 2022-08-19 NOTE — Op Note (Signed)
Patient:  Stefanie Farmer  DOB:  [birth year]  MRN:  [id number]   Preop Diagnosis: Biliary colic, cholelithiasis  Postop Diagnosis: Same  Procedure: Robotic assisted laparoscopic cholecystectomy  Surgeon: Aviva Signs, MD  Anes: General endotracheal  Indications: Patient is a 27 year old white female who presents with biliary colic secondary to cholelithiasis.  The risks and benefits of the procedure including bleeding, infection, hepatobiliary injury, and the possibility of an open procedure were fully explained to the patient, who gave informed consent.  Procedure note: The patient was placed in the supine position.  After induction of general endotracheal anesthesia, the abdomen was prepped and draped using usual sterile technique with ChloraPrep.  Surgical site confirmation was performed.  An infraumbilical incision was made down to the fascia.  A Veress needle was introduced into the abdominal cavity and confirmation of placement was done using the saline drop test.  The abdomen was then insufflated to 15 mmHg pressure.  An 8 mm trocar was introduced into the abdominal cavity under direct visualization without difficulty.  Additional 8 mm trocars were placed in the right flank, right midclavicular line, and left upper quadrant regions.  The patient was placed in reverse Trendelenburg position.  The robot was then targeted and docked.  The liver was inspected and noted within normal limits.  The gallbladder was retracted in a dynamic fashion in order to provide a critical view of the triangle of [place].  The cystic duct was first identified.  Its junction to the infundibulum was fully identified.  This was confirmed by [employer].  Hem-o-lok clips were placed proximally distally on the cystic duct and the cystic duct was divided.  This was likewise done to the cystic artery.  The gallbladder was freed away from the gallbladder fossa using Bovie electrocautery.  The gallbladder was delivered through  the epigastric trocar site using an Endo Catch bag.  The gallbladder fossa was inspected and no abnormal bleeding or bile leakage was noted.  The robot was then undocked.  All air were then evacuated from the abdominal cavity prior to removal of the trocars.  All wounds were irrigated normal saline.  All wounds were injected with Exparel.  All incisions were closed using a 4-0 Monocryl subcuticular suture.  Dermabond was applied.  All tape and needle counts were correct at the end of the procedure.  The patient was extubated in the operating room and transferred to PACU in stable condition.  Complications: None  EBL: Minimal  Specimen: Gallbladder

## 2022-08-19 NOTE — Anesthesia Preprocedure Evaluation (Signed)
Anesthesia Evaluation  Patient identified by MRN, date of birth, ID band Patient awake    Reviewed: Allergy & Precautions, H&P , NPO status , Patient's Chart, lab work & pertinent test results  Airway Mallampati: II  TM Distance: >3 FB Neck ROM: Full    Dental  (+) Dental Advisory Given, Chipped,    Pulmonary Patient abstained from smoking., former smoker   Pulmonary exam normal breath sounds clear to auscultation       Cardiovascular Exercise Tolerance: Good hypertension, Pt. on medications Normal cardiovascular exam Rhythm:Regular Rate:Normal  17-Aug-2022 15:27:34 [facility]-AP-OPS ROUTINE RECORD 1995/08/21 (26 yr) Female Caucasian Vent. rate 71 BPM PR interval 134 ms QRS duration 92 ms QT/QTcB 394/428 ms P-R-T axes 64 102 74 Sinus rhythm with marked sinus arrhythmia Rightward axis Borderline ECG No previous ECGs available Confirmed by Lyman Bishop [id number]) on 08/17/2022 8:48:23 PM   Neuro/Psych negative neurological ROS  negative psych ROS   GI/Hepatic negative GI ROS, Neg liver ROS,,,  Endo/Other    Morbid obesity  Renal/GU Renal InsufficiencyRenal disease  negative genitourinary   Musculoskeletal negative musculoskeletal ROS (+)    Abdominal   Peds negative pediatric ROS (+)  Hematology negative hematology ROS (+)   Anesthesia Other Findings   Reproductive/Obstetrics negative OB ROS                              Anesthesia Physical Anesthesia Plan  ASA: 3  Anesthesia Plan: General   Post-op Pain Management: Dilaudid IV   Induction: Intravenous  PONV Risk Score and Plan: 4 or greater and Ondansetron, Dexamethasone, Midazolam and Scopolamine patch - Pre-op  Airway Management Planned: Oral ETT  Additional Equipment:   Intra-op Plan:   Post-operative Plan: Extubation in OR  Informed Consent: I have reviewed the patients History and Physical, chart,  labs and discussed the procedure including the risks, benefits and alternatives for the proposed anesthesia with the patient or authorized representative who has indicated his/her understanding and acceptance.     Dental advisory given  Plan Discussed with: CRNA and Surgeon  Anesthesia Plan Comments:          Anesthesia Quick Evaluation

## 2022-08-20 LAB — SURGICAL PATHOLOGY

## 2022-08-30 ENCOUNTER — Ambulatory Visit (INDEPENDENT_AMBULATORY_CARE_PROVIDER_SITE_OTHER): Payer: Medicaid Other | Admitting: General Surgery

## 2022-08-30 DIAGNOSIS — Z09 Encounter for follow-up examination after completed treatment for conditions other than malignant neoplasm: Secondary | ICD-10-CM

## 2022-08-30 NOTE — Progress Notes (Signed)
Attempted postoperative telephone visit with patient.  Left message for patient to call my office should any problems be present.

## 2022-10-04 NOTE — H&P (Signed)
  Patient: Stefanie Farmer  PID: [id number]  DOB: [birth year]  SEX: Female   Patient referred by DDS for extraction teeth 1, 2, 16, 17, 31, 32.  CC: On and off pain.  Past Medical History:  Smoker, Morbid Obesity    Medications: None    Allergies:     NKDA    Surgeries:   Foot surgery, Kidney Stone surgery, Ankle surgery     Social History       Smoking:  Vapes          Alcohol: n Drug use: n                             Exam: BMI 53. Impacted 1, 16, 17, 32. Large decay #31. No purulence, edema, fluctuance, trismus. Oral cancer screening negative. Pharynx clear. No lymphadenopathy.  Panorex:Impacted 1, 16, 17, 32. Large decay #31.  Assessment: ASA 3. Non-restorable #31 due to decay. Impacted 1, 16, 17, 32. Discussed extraction of #2 due to no opposing occlusal contact and potential to super-erupt.              Plan: Extraction Teeth # 1, 2, 16, 17, 31, 32.  Hospital Day surgery.                 Rx: n               Risks and complications explained. Questions answered.   Georgia Lopes, DMD

## 2022-10-06 ENCOUNTER — Encounter (HOSPITAL_COMMUNITY): Payer: Self-pay | Admitting: Oral Surgery

## 2022-10-06 ENCOUNTER — Other Ambulatory Visit: Payer: Self-pay

## 2022-10-06 NOTE — Progress Notes (Addendum)
PCP - denies Cardiologist - denies  PPM/ICD - denies  EKG - 08/17/22  CPAP - n/a  Fasting Blood Sugar - n/a  Blood Thinner Instructions: n/a Patient was instructed: As of today, STOP taking any Aspirin (unless otherwise instructed by your surgeon) Aleve, Naproxen, Ibuprofen, Motrin, Advil, Goody's, BC's, all herbal medications, fish oil, and all vitamins.  ERAS Protcol - n/a  COVID TEST- n/a  Anesthesia review: no  Patient verbally denies any shortness of breath, fever, cough and chest pain during phone call   -------------  SDW INSTRUCTIONS given:  Your procedure is scheduled on Friday, May 10th, 2024.  Report to [facility] Main Entrance "A" at 07:45 A.M., and check in at the Admitting office.  Call this number if you have problems the morning of surgery:  [phone]   Remember:  Do not eat or drink after midnight the night before your surgery    Take these medicines the morning of surgery with A SIP OF WATER: PRN: Tylenol, Percocet   The day of surgery:                     Do not wear jewelry, make up, or nail polish            Do not wear lotions, powders, perfumes, or deodorant.            Do not shave 48 hours prior to surgery.              Do not bring valuables to the hospital.            [facility] is not responsible for any belongings or valuables.  Do NOT Smoke (Tobacco/Vaping) 24 hours prior to your procedure If you use a CPAP at night, you may bring all equipment for your overnight stay.   Contacts, glasses, dentures or bridgework may not be worn into surgery.      For patients admitted to the hospital, discharge time will be determined by your treatment team.   Patients discharged the day of surgery will not be allowed to drive home, and someone needs to stay with them for 24 hours.    Special instructions:   [place]- Preparing For Surgery  Before surgery, you can play an important role. Because skin is not sterile, your skin needs to be  as free of germs as possible. You can reduce the number of germs on your skin by washing with CHG (chlorahexidine gluconate) Soap before surgery.  CHG is an antiseptic cleaner which kills germs and bonds with the skin to continue killing germs even after washing.    Oral Hygiene is also important to reduce your risk of infection.  Remember - BRUSH YOUR TEETH THE MORNING OF SURGERY WITH YOUR REGULAR TOOTHPASTE  Please do not use if you have an allergy to CHG or antibacterial soaps. If your skin becomes reddened/irritated stop using the CHG.  Do not shave (including legs and underarms) for at least 48 hours prior to first CHG shower. It is OK to shave your face.  Please follow these instructions carefully.   Shower the NIGHT BEFORE SURGERY and the MORNING OF SURGERY with DIAL Soap.   Pat yourself dry with a CLEAN TOWEL.  Wear CLEAN PAJAMAS to bed the night before surgery  Place CLEAN SHEETS on your bed the night of your first shower and DO NOT SLEEP WITH PETS.   Day of Surgery: Please shower morning of surgery  Wear Clean/Comfortable clothing the  morning of surgery Do not apply any deodorants/lotions.   Remember to brush your teeth WITH YOUR REGULAR TOOTHPASTE.   Questions were answered. Patient verbalized understanding of instructions.

## 2022-10-08 ENCOUNTER — Other Ambulatory Visit: Payer: Self-pay

## 2022-10-08 ENCOUNTER — Encounter (HOSPITAL_COMMUNITY)
Admission: RE | Disposition: A | Discharge status: Home / Self Care | Payer: Self-pay | Source: Home / Self Care | Attending: Oral Surgery

## 2022-10-08 ENCOUNTER — Ambulatory Visit (HOSPITAL_COMMUNITY)
Admission: RE | Admit: 2022-10-08 | Discharge: 2022-10-08 | Disposition: A | Discharge status: Home / Self Care | Payer: Medicaid Other | Attending: Oral Surgery | Admitting: Oral Surgery

## 2022-10-08 ENCOUNTER — Encounter (HOSPITAL_COMMUNITY): Payer: Self-pay | Admitting: Oral Surgery

## 2022-10-08 ENCOUNTER — Ambulatory Visit (HOSPITAL_BASED_OUTPATIENT_CLINIC_OR_DEPARTMENT_OTHER): Payer: Medicaid Other | Admitting: Certified Registered Nurse Anesthetist

## 2022-10-08 ENCOUNTER — Ambulatory Visit (HOSPITAL_COMMUNITY): Payer: Medicaid Other | Admitting: Certified Registered Nurse Anesthetist

## 2022-10-08 DIAGNOSIS — K0850 Unsatisfactory restoration of tooth, unspecified: Secondary | ICD-10-CM | POA: Diagnosis not present

## 2022-10-08 DIAGNOSIS — Z87891 Personal history of nicotine dependence: Secondary | ICD-10-CM | POA: Diagnosis not present

## 2022-10-08 DIAGNOSIS — Z6841 Body Mass Index (BMI) 40.0 and over, adult: Secondary | ICD-10-CM | POA: Insufficient documentation

## 2022-10-08 DIAGNOSIS — K011 Impacted teeth: Secondary | ICD-10-CM

## 2022-10-08 DIAGNOSIS — K029 Dental caries, unspecified: Secondary | ICD-10-CM | POA: Insufficient documentation

## 2022-10-08 DIAGNOSIS — F17290 Nicotine dependence, other tobacco product, uncomplicated: Secondary | ICD-10-CM | POA: Insufficient documentation

## 2022-10-08 HISTORY — PX: TOOTH EXTRACTION: SHX859

## 2022-10-08 LAB — BASIC METABOLIC PANEL
Anion gap: 11 (ref 5–15)
BUN: 10 mg/dL (ref 6–20)
CO2: 23 mmol/L (ref 22–32)
Calcium: 8.9 mg/dL (ref 8.9–10.3)
Chloride: 103 mmol/L (ref 98–111)
Creatinine, Ser: 0.86 mg/dL (ref 0.44–1.00)
GFR, Estimated: >60 mL/min (ref >60)
Glucose, Bld: 94 mg/dL (ref 70–99)
Potassium: 3.9 mmol/L (ref 3.5–5.1)
Sodium: 137 mmol/L (ref 135–145)

## 2022-10-08 LAB — CBC
HCT: 37.3 % (ref 36.0–46.0)
Hemoglobin: 11.7 g/dL — ABNORMAL LOW (ref 12.0–15.0)
MCH: 26.9 pg (ref 26.0–34.0)
MCHC: 31.4 g/dL (ref 30.0–36.0)
MCV: 85.7 fL (ref 80.0–100.0)
Platelets: 397 10*3/uL (ref 150–400)
RBC: 4.35 MIL/uL (ref 3.87–5.11)
RDW: 14.4 % (ref 11.5–15.5)
WBC: 9.7 10*3/uL (ref 4.0–10.5)
nRBC: 0 % (ref 0.0–0.2)

## 2022-10-08 LAB — POCT PREGNANCY, URINE: Preg Test, Ur: NEGATIVE

## 2022-10-08 SURGERY — DENTAL RESTORATION/EXTRACTIONS
Anesthesia: General

## 2022-10-08 MED ORDER — OXYMETAZOLINE HCL 0.05 % NA SOLN
NASAL | Status: AC
  Filled 2022-10-08: qty 30

## 2022-10-08 MED ORDER — MIDAZOLAM HCL 2 MG/2ML IJ SOLN
INTRAMUSCULAR | Status: DC | PRN
  Administered 2022-10-08: 2 mg via INTRAVENOUS

## 2022-10-08 MED ORDER — LIDOCAINE 2% (20 MG/ML) 5 ML SYRINGE
INTRAMUSCULAR | Status: DC | PRN
  Administered 2022-10-08: 100 mg via INTRAVENOUS

## 2022-10-08 MED ORDER — CHLORHEXIDINE GLUCONATE 0.12 % MT SOLN
OROMUCOSAL | Status: AC
  Administered 2022-10-08: 15 mL via OROMUCOSAL
  Filled 2022-10-08: qty 15

## 2022-10-08 MED ORDER — DEXAMETHASONE SODIUM PHOSPHATE 10 MG/ML IJ SOLN
INTRAMUSCULAR | Status: DC | PRN
  Administered 2022-10-08: 10 mg via INTRAVENOUS

## 2022-10-08 MED ORDER — ONDANSETRON HCL 4 MG/2ML IJ SOLN
INTRAMUSCULAR | Status: AC
  Filled 2022-10-08: qty 4

## 2022-10-08 MED ORDER — FENTANYL CITRATE (PF) 250 MCG/5ML IJ SOLN
INTRAMUSCULAR | Status: DC | PRN
  Administered 2022-10-08: 50 ug via INTRAVENOUS

## 2022-10-08 MED ORDER — FENTANYL CITRATE (PF) 250 MCG/5ML IJ SOLN
INTRAMUSCULAR | Status: AC
  Filled 2022-10-08: qty 5

## 2022-10-08 MED ORDER — ONDANSETRON HCL 4 MG/2ML IJ SOLN
INTRAMUSCULAR | Status: DC | PRN
  Administered 2022-10-08: 4 mg via INTRAVENOUS

## 2022-10-08 MED ORDER — MIDAZOLAM HCL 2 MG/2ML IJ SOLN
INTRAMUSCULAR | Status: AC
  Filled 2022-10-08: qty 2

## 2022-10-08 MED ORDER — CHLORHEXIDINE GLUCONATE 0.12 % MT SOLN: 15.0000 mL | Freq: Once | OROMUCOSAL | Status: AC

## 2022-10-08 MED ORDER — SUGAMMADEX SODIUM 200 MG/2ML IV SOLN
INTRAVENOUS | Status: DC | PRN
  Administered 2022-10-08: 200 mg via INTRAVENOUS

## 2022-10-08 MED ORDER — ROCURONIUM BROMIDE 10 MG/ML (PF) SYRINGE
PREFILLED_SYRINGE | INTRAVENOUS | Status: DC | PRN
  Administered 2022-10-08: 60 mg via INTRAVENOUS

## 2022-10-08 MED ORDER — OXYCODONE HCL 5 MG PO TABS
5.0000 mg | ORAL_TABLET | Freq: Once | ORAL | Status: AC | PRN
  Administered 2022-10-08: 5 mg via ORAL

## 2022-10-08 MED ORDER — OXYCODONE HCL 5 MG PO TABS
ORAL_TABLET | ORAL | Status: AC
  Filled 2022-10-08: qty 1

## 2022-10-08 MED ORDER — LIDOCAINE-EPINEPHRINE 2 %-1:100000 IJ SOLN
INTRAMUSCULAR | Status: DC | PRN
  Administered 2022-10-08: 20 mL

## 2022-10-08 MED ORDER — LIDOCAINE 2% (20 MG/ML) 5 ML SYRINGE
INTRAMUSCULAR | Status: AC
  Filled 2022-10-08: qty 10

## 2022-10-08 MED ORDER — AMOXICILLIN 500 MG PO CAPS: 500.0000 mg | ORAL_CAPSULE | Freq: Three times a day (TID) | ORAL | 0 refills | Status: DC

## 2022-10-08 MED ORDER — DEXMEDETOMIDINE HCL IN NACL 80 MCG/20ML IV SOLN
INTRAVENOUS | Status: DC | PRN
  Administered 2022-10-08: 20 ug via INTRAVENOUS

## 2022-10-08 MED ORDER — ROCURONIUM BROMIDE 10 MG/ML (PF) SYRINGE
PREFILLED_SYRINGE | INTRAVENOUS | Status: AC
  Filled 2022-10-08: qty 20

## 2022-10-08 MED ORDER — DEXAMETHASONE SODIUM PHOSPHATE 10 MG/ML IJ SOLN
INTRAMUSCULAR | Status: AC
  Filled 2022-10-08: qty 2

## 2022-10-08 MED ORDER — 0.9 % SODIUM CHLORIDE (POUR BTL) OPTIME
TOPICAL | Status: DC | PRN
  Administered 2022-10-08: 1000 mL

## 2022-10-08 MED ORDER — SODIUM CHLORIDE 0.9 % IR SOLN
Status: DC | PRN
  Administered 2022-10-08: 1000 mL

## 2022-10-08 MED ORDER — OXYCODONE HCL 5 MG/5ML PO SOLN: 5.0000 mg | Freq: Once | ORAL | Status: AC | PRN

## 2022-10-08 MED ORDER — CEFAZOLIN IN SODIUM CHLORIDE 3-0.9 GM/100ML-% IV SOLN
3.0000 g | INTRAVENOUS | Status: AC
  Administered 2022-10-08: 3 g via INTRAVENOUS
  Filled 2022-10-08: qty 100

## 2022-10-08 MED ORDER — FENTANYL CITRATE (PF) 100 MCG/2ML IJ SOLN
INTRAMUSCULAR | Status: AC
  Filled 2022-10-08: qty 2

## 2022-10-08 MED ORDER — OXYCODONE-ACETAMINOPHEN 5-325 MG PO TABS: 1.0000 | ORAL_TABLET | ORAL | 0 refills | Status: DC | PRN

## 2022-10-08 MED ORDER — LIDOCAINE-EPINEPHRINE 2 %-1:100000 IJ SOLN
INTRAMUSCULAR | Status: AC
  Filled 2022-10-08: qty 1

## 2022-10-08 MED ORDER — ORAL CARE MOUTH RINSE: 15.0000 mL | Freq: Once | OROMUCOSAL | Status: AC

## 2022-10-08 MED ORDER — AMISULPRIDE (ANTIEMETIC) 5 MG/2ML IV SOLN: 10.0000 mg | Freq: Once | INTRAVENOUS | Status: DC | PRN

## 2022-10-08 MED ORDER — PROPOFOL 10 MG/ML IV BOLUS
INTRAVENOUS | Status: DC | PRN
  Administered 2022-10-08: 200 mg via INTRAVENOUS
  Administered 2022-10-08: 50 mg via INTRAVENOUS

## 2022-10-08 MED ORDER — FENTANYL CITRATE (PF) 100 MCG/2ML IJ SOLN
25.0000 ug | INTRAMUSCULAR | Status: DC | PRN
  Administered 2022-10-08: 50 ug via INTRAVENOUS
  Administered 2022-10-08: 25 ug via INTRAVENOUS

## 2022-10-08 MED ORDER — LACTATED RINGERS IV SOLN: INTRAVENOUS | Status: DC

## 2022-10-08 MED ORDER — PHENYLEPHRINE 80 MCG/ML (10ML) SYRINGE FOR IV PUSH (FOR BLOOD PRESSURE SUPPORT)
PREFILLED_SYRINGE | INTRAVENOUS | Status: DC | PRN
  Administered 2022-10-08: 160 ug via INTRAVENOUS

## 2022-10-08 SURGICAL SUPPLY — 36 items
BAG COUNTER SPONGE SURGICOUNT (BAG) IMPLANT
BAG SPNG CNTER NS LX DISP (BAG)
BLADE SURG 15 STRL LF DISP TIS (BLADE) ×1 IMPLANT
BLADE SURG 15 STRL SS (BLADE) ×1
BUR CROSS CUT FISSURE 1.6 (BURR) ×1 IMPLANT
BUR EGG ELITE 4.0 (BURR) ×1 IMPLANT
CANISTER SUCT 3000ML PPV (MISCELLANEOUS) ×1 IMPLANT
COVER SURGICAL LIGHT HANDLE (MISCELLANEOUS) ×1 IMPLANT
GAUZE PACKING FOLDED 2  STR (GAUZE/BANDAGES/DRESSINGS) ×1
GAUZE PACKING FOLDED 2 STR (GAUZE/BANDAGES/DRESSINGS) ×1 IMPLANT
GLOVE BIO SURGEON STRL SZ 6.5 (GLOVE) IMPLANT
GLOVE BIO SURGEON STRL SZ7 (GLOVE) IMPLANT
GLOVE BIO SURGEON STRL SZ8 (GLOVE) ×1 IMPLANT
GLOVE BIOGEL PI IND STRL 6.5 (GLOVE) IMPLANT
GLOVE BIOGEL PI IND STRL 7.0 (GLOVE) IMPLANT
GOWN STRL REUS W/ TWL LRG LVL3 (GOWN DISPOSABLE) ×1 IMPLANT
GOWN STRL REUS W/ TWL XL LVL3 (GOWN DISPOSABLE) ×1 IMPLANT
GOWN STRL REUS W/TWL LRG LVL3 (GOWN DISPOSABLE) ×1
GOWN STRL REUS W/TWL XL LVL3 (GOWN DISPOSABLE) ×1
IV NS 1000ML (IV SOLUTION) ×1
IV NS 1000ML BAXH (IV SOLUTION) ×1 IMPLANT
KIT BASIN OR (CUSTOM PROCEDURE TRAY) ×1 IMPLANT
KIT TURNOVER KIT B (KITS) ×1 IMPLANT
NDL HYPO 25GX1X1/2 BEV (NEEDLE) ×2 IMPLANT
NEEDLE HYPO 25GX1X1/2 BEV (NEEDLE) ×2 IMPLANT
NS IRRIG 1000ML POUR BTL (IV SOLUTION) ×1 IMPLANT
PAD ARMBOARD 7.5X6 YLW CONV (MISCELLANEOUS) ×1 IMPLANT
SLEEVE IRRIGATION ELITE 7 (MISCELLANEOUS) ×1 IMPLANT
SPIKE FLUID TRANSFER (MISCELLANEOUS) ×1 IMPLANT
SPONGE SURGIFOAM ABS GEL 12-7 (HEMOSTASIS) IMPLANT
SUT CHROMIC 3 0 PS 2 (SUTURE) ×1 IMPLANT
SYR BULB IRRIG 60ML STRL (SYRINGE) ×1 IMPLANT
SYR CONTROL 10ML LL (SYRINGE) ×1 IMPLANT
TRAY ENT MC OR (CUSTOM PROCEDURE TRAY) ×1 IMPLANT
TUBING IRRIGATION (MISCELLANEOUS) ×1 IMPLANT
YANKAUER SUCT BULB TIP NO VENT (SUCTIONS) ×1 IMPLANT

## 2022-10-08 NOTE — Transfer of Care (Signed)
Immediate Anesthesia Transfer of Care Note  Patient: Stefanie Farmer  Procedure(s) Performed: DENTAL RESTORATION/EXTRACTIONS  Patient Location: PACU  Anesthesia Type:General  Level of Consciousness: awake, alert , and oriented  Airway & Oxygen Therapy: Patient Spontanous Breathing and Patient connected to face mask oxygen  Post-op Assessment: Report given to RN and Post -op Vital signs reviewed and stable  Post vital signs: Reviewed and stable  Last Vitals:  Vitals Value Taken Time  BP 125/65 10/08/22 0952  Temp 36.3 C 10/08/22 0952  Pulse 92 10/08/22 0953  Resp 21 10/08/22 0953  SpO2 100 % 10/08/22 0953  Vitals shown include unvalidated device data.  Last Pain:  Vitals:   10/08/22 0746  TempSrc: Oral  PainSc: 0-No pain         Complications: No notable events documented.

## 2022-10-08 NOTE — Anesthesia Postprocedure Evaluation (Signed)
Anesthesia Post Note  Patient: Starsha Schoene  Procedure(s) Performed: DENTAL RESTORATION/EXTRACTIONS     Patient location during evaluation: PACU Anesthesia Type: General Level of consciousness: awake Pain management: pain level controlled Vital Signs Assessment: post-procedure vital signs reviewed and stable Respiratory status: spontaneous breathing, nonlabored ventilation and respiratory function stable Cardiovascular status: blood pressure returned to baseline and stable Postop Assessment: no apparent nausea or vomiting Anesthetic complications: no   No notable events documented.  Last Vitals:  Vitals:   10/08/22 1030 10/08/22 1045  BP: (!) 141/76 (!) 146/76  Pulse: 72 78  Resp: 18 (!) 22  Temp:  (!) 36.3 C  SpO2: 99% 97%    Last Pain:  Vitals:   10/08/22 1045  TempSrc:   PainSc: 5                  Linton Rump

## 2022-10-08 NOTE — Anesthesia Procedure Notes (Signed)
Procedure Name: Intubation Date/Time: 10/08/2022 8:55 AM  Performed by: Darryl Nestle, CRNAPre-anesthesia Checklist: Patient identified, Emergency Drugs available, Suction available and Patient being monitored Patient Re-evaluated:Patient Re-evaluated prior to induction Oxygen Delivery Method: Circle system utilized Preoxygenation: Pre-oxygenation with 100% oxygen Induction Type: IV induction Ventilation: Mask ventilation without difficulty Laryngoscope Size: Mac, Glidescope and 3 Grade View: Grade I Nasal Tubes: Nasal prep performed, Nasal Rae, Right and Magill forceps- large, utilized Tube size: 6.0 mm Number of attempts: 2 Airway Equipment and Method: Video-laryngoscopy Placement Confirmation: ETT inserted through vocal cords under direct vision, positive ETCO2 and breath sounds checked- equal and bilateral Tube secured with: Tape Dental Injury: Teeth and Oropharynx as per pre-operative assessment  Comments: Attempt 1 DL mac 3 esophageal intubation-> stomach suctioned  Attempt 2 Glidescope mac 3 grade 1 successful nasal intubation

## 2022-10-08 NOTE — Op Note (Signed)
10/08/2022  9:38 AM  PATIENT:  Stefanie Farmer  27 y.o. female  PRE-OPERATIVE DIAGNOSIS:  IMPACTED TEETH # 1, 16, 17, 32, NON-RESTORABLE TEETH # 2, 31, SECONDARY TO DENTAL CARIES  POST-OPERATIVE DIAGNOSIS:  SAME  PROCEDURE:  Procedure(s): EXTRACTION TEETH # 1, 2, 16, 17, 31, 32  SURGEON:  Surgeon(s): Ocie Doyne, DMD  ANESTHESIA:   local and general  EBL:  minimal  DRAINS: none   SPECIMEN:  No Specimen  COUNTS:  YES  PLAN OF CARE: Discharge to home after PACU  PATIENT DISPOSITION:  PACU - hemodynamically stable.   PROCEDURE DETAILS: Dictation # [id number]  Georgia Lopes, DMD 10/08/2022 9:38 AM

## 2022-10-08 NOTE — Op Note (Unsigned)
NAME: Stefanie Farmer, HELLIWELL MEDICAL RECORD NO: [id number] ACCOUNT NO: [id number] DATE OF BIRTH: 1995/11/16 FACILITY: [facility] LOCATION: [facility] PHYSICIAN: Georgia Lopes, DDS  Operative Report   DATE OF PROCEDURE: 10/08/2022  PREOPERATIVE DIAGNOSIS: Impacted teeth 1, 16, 17 and 32, nonrestorable teeth numbers 2 and 31 secondary to dental caries, morbid obesity.  POSTOPERATIVE DIAGNOSIS:  Impacted teeth 1, 16, 17 and 32, nonrestorable teeth numbers 2 and 31 secondary to dental caries, morbid obesity.  PROCEDURE:  Extraction teeth 1, 2, 16, 17, 31, 32.  SURGEON:  Georgia Lopes, DDS  ANESTHESIA:  General, nasal intubation, Dr. Freida Busman attending.  DESCRIPTION OF PROCEDURE:  The patient was taken to the operating room and placed on the table in supine position.  General anesthesia was administered and nasal endotracheal tube was placed and secured.  The eyes were protected and the patient was  draped for surgery.  Timeout was performed.  The posterior pharynx was suctioned and a throat pack was placed.  2% lidocaine 1:100,000 epinephrine was infiltrated in an inferior alveolar block on the right and left side and in buccal and palatal  infiltration in the posterior maxilla around the teeth to be removed.  A bite block was placed on the right side of the mouth.  A sweetheart retractor was used to retract the tongue.  A #15 blade was used to make an incision overlying tooth #17 carried  forward to the buccal sulcus of tooth #18.  The flap was reflected.  Bone was removed overlying the tooth.  The tooth was sectioned and removed with 301 elevator.  The socket was curetted, irrigated and closed with 3-0 chromic in the maxilla. The 15  blade was used to make an oblique incision overlying tooth #16 carried forward in the buccal sulcus until tooth #14.  The periosteum was reflected.  Bone was removed with Stryker handpiece.  The tooth was elevated and removed with the dental 301  elevator.  Then, the socket was  curetted and irrigated and closed with 3-0 chromic.  The bite block and sweetheart retractor were repositioned to the other side of the mouth to operate on the right side.  The 15 blade was used to make an incision  overlying tooth #32 carried forward around tooth #31 buccally and lingually and then the periosteum was reflected.  Bone was removed overlying tooth #32 around tooth #31.  The teeth were elevated and removed with dental 301 elevator.  The socket was  curetted, irrigated and closed with 3-0 chromic.  The 15 blade was used to make an incision overlying tooth #1 carried forward buccally and palatally around tooth #2.  The periosteum was reflected.  Bone was removed overlying tooth #1.  Tooth #2 was  elevated and removed with dental elevator and tooth #1 was removed with the 301 elevator and Potts elevator.  The sockets were curetted, irrigated and closed with 3-0 chromic.  Additional local anesthesia was administered.  The oral cavity was then  irrigated and suctioned.  The throat pack was removed.  The patient was left under the care of anesthesia for extubation and transported to recovery with plans for discharge home through day surgery.  ESTIMATED BLOOD LOSS:  Minimal.  COMPLICATIONS:  None.  SPECIMENS:  None.  COUNTS:  Correct.   PUS D: 10/08/2022 9:42:10 am T: 10/08/2022 11:11:00 am  JOB: [id number]/ [id number]

## 2022-10-08 NOTE — Anesthesia Preprocedure Evaluation (Addendum)
Anesthesia Evaluation  Patient identified by MRN, date of birth, ID band Patient awake    Reviewed: Allergy & Precautions, NPO status , Patient's Chart, lab work & pertinent test results  History of Anesthesia Complications Negative for: history of anesthetic complications  Airway Mallampati: II  TM Distance: >3 FB Neck ROM: Full   Comment: Previous grade I view with MAC 3, easy mask Dental  (+) Dental Advisory Given   Pulmonary neg shortness of breath, neg sleep apnea, neg COPD, neg recent URI, former smoker vapes   Pulmonary exam normal breath sounds clear to auscultation       Cardiovascular negative cardio ROS  Rhythm:Regular Rate:Normal     Neuro/Psych negative neurological ROS     GI/Hepatic negative GI ROS, Neg liver ROS,,,  Endo/Other  neg diabetes  Morbid obesity  Renal/GU Renal disease (h/o stones)     Musculoskeletal   Abdominal  (+) + obese  Peds  Hematology   Anesthesia Other Findings   Reproductive/Obstetrics                             Anesthesia Physical Anesthesia Plan  ASA: 3  Anesthesia Plan: General   Post-op Pain Management:    Induction: Intravenous  PONV Risk Score and Plan: 3 and Ondansetron, Dexamethasone and Treatment may vary due to age or medical condition  Airway Management Planned: Nasal ETT  Additional Equipment:   Intra-op Plan:   Post-operative Plan: Extubation in OR  Informed Consent: I have reviewed the patients History and Physical, chart, labs and discussed the procedure including the risks, benefits and alternatives for the proposed anesthesia with the patient or authorized representative who has indicated his/her understanding and acceptance.     Dental advisory given  Plan Discussed with: CRNA and Anesthesiologist  Anesthesia Plan Comments: (Risks of general anesthesia discussed including, but not limited to, sore throat, hoarse  voice, chipped/damaged teeth, injury to vocal cords, nausea and vomiting, allergic reactions, lung infection, heart attack, stroke, and death. All questions answered. )       Anesthesia Quick Evaluation

## 2022-10-08 NOTE — H&P (Signed)
H&P documentation  -History and Physical Reviewed  -Patient has been re-examined  -No change in the plan of care  Stefanie Farmer  

## 2022-10-09 ENCOUNTER — Encounter (HOSPITAL_COMMUNITY): Payer: Self-pay | Admitting: Oral Surgery

## 2022-12-04 ENCOUNTER — Ambulatory Visit
Admission: EM | Admit: 2022-12-04 | Discharge: 2022-12-04 | Disposition: A | Discharge status: Home / Self Care | Payer: Medicaid Other | Source: Home / Self Care

## 2022-12-04 DIAGNOSIS — J069 Acute upper respiratory infection, unspecified: Secondary | ICD-10-CM | POA: Insufficient documentation

## 2022-12-04 DIAGNOSIS — J029 Acute pharyngitis, unspecified: Secondary | ICD-10-CM | POA: Insufficient documentation

## 2022-12-04 LAB — POCT MONO SCREEN (KUC): Mono, POC: NEGATIVE

## 2022-12-04 LAB — POCT RAPID STREP A (OFFICE): Rapid Strep A Screen: NEGATIVE

## 2022-12-04 MED ORDER — PSEUDOEPH-BROMPHEN-DM 30-2-10 MG/5ML PO SYRP: 5.0000 mL | ORAL_SOLUTION | Freq: Four times a day (QID) | ORAL | 0 refills | Status: DC | PRN

## 2022-12-04 MED ORDER — FLUTICASONE PROPIONATE 50 MCG/ACT NA SUSP: 2.0000 | Freq: Every day | NASAL | 0 refills | Status: DC

## 2022-12-04 MED ORDER — CETIRIZINE HCL 10 MG PO TABS: 10.0000 mg | ORAL_TABLET | Freq: Every day | ORAL | 0 refills | Status: DC

## 2022-12-04 NOTE — ED Provider Notes (Signed)
[facility]    CSN: [id number] Arrival date & time: 12/04/22  1458      History   Chief Complaint Chief Complaint  Patient presents with   Sore Throat    Right side hurts to swallow, right top part of neck hurts, sinuses and cough - Entered by patient    HPI Stefanie Farmer is a 27 y.o. female.   The history is provided by the patient.   The patient presents for complaints of sore throat, nasal congestion, headache, and cough.  Symptoms have been present for the past 2 to 3 days.  Patient denies fever, chills, ear pain, ear drainage, wheezing, shortness of breath, difficulty breathing, or GI symptoms.  Patient reports that she has been taking over-the-counter cough and cold medications with minimal relief.  Patient states that her daughter who is 27 months old was diagnosed with a viral infection.  Patient denies any other sick contacts.  Past Medical History:  Diagnosis Date   History of kidney stones    Hypertension    Kidney stone     Patient Active Problem List   Diagnosis Date Noted   Calculus of gallbladder without cholecystitis without obstruction 08/19/2022   Epigastric pain 07/02/2022   Nausea without vomiting 07/02/2022   Chronic hypertension affecting pregnancy 09/15/2021   Kidney stone 07/22/2021   Complicated UTI (urinary tract infection) 07/22/2021   Morbid obesity with BMI of 45.0-49.9, adult (HCC) 07/22/2021   Closed displaced fracture of lateral malleolus of fibula with routine healing     Past Surgical History:  Procedure Laterality Date   CHOLECYSTECTOMY     CYSTOSCOPY W/ URETERAL STENT PLACEMENT Right 07/23/2021   Procedure: CYSTOSCOPY WITH RETROGRADE PYELOGRAM/URETERAL STENT PLACEMENT;  Surgeon: Malen Gauze, MD;  Location: [facility];  Service: Urology;  Laterality: Right;   CYSTOSCOPY W/ URETERAL STENT PLACEMENT Right 08/13/2021   Procedure: CYSTOSCOPY WITH RETROGRADE PYELOGRAM/URETERAL STENT EXCHANGE;  Surgeon: Malen Gauze,  MD;  Location: [facility];  Service: Urology;  Laterality: Right;   EYE MUSCLE SURGERY     FOOT SURGERY     HOLMIUM LASER APPLICATION Right 08/13/2021   Procedure: HOLMIUM LASER APPLICATION;  Surgeon: Malen Gauze, MD;  Location: [facility];  Service: Urology;  Laterality: Right;   ORIF ANKLE FRACTURE Right 06/24/2020   Procedure: OPEN REDUCTION INTERNAL FIXATION (ORIF) ANKLE FRACTURE;  Surgeon: Vickki Hearing, MD;  Location: [facility];  Service: Orthopedics;  Laterality: Right;   TOOTH EXTRACTION N/A 10/08/2022   Procedure: DENTAL RESTORATION/EXTRACTIONS;  Surgeon: Ocie Doyne, DMD;  Location: [facility];  Service: Oral Surgery;  Laterality: N/A;    OB History     Gravida  2   Para  1   Term  1   Preterm      AB  1   Living  1      SAB  1   IAB      Ectopic      Multiple  0   Live Births  1            Home Medications    Prior to Admission medications   Medication Sig Start Date End Date Taking? Authorizing Provider  brompheniramine-pseudoephedrine-DM 30-2-10 MG/5ML syrup Take 5 mLs by mouth 4 (four) times daily as needed. 12/04/22  Yes Fiona Coto-Warren, Sadie Haber, NP  cetirizine (ZYRTEC) 10 MG tablet Take 1 tablet (10 mg total) by mouth daily. 12/04/22  Yes Tamar Lipscomb-Warren, Sadie Haber, NP  fluticasone (FLONASE) 50 MCG/ACT nasal spray  Place 2 sprays into both nostrils daily. 12/04/22  Yes Lafreda Casebeer-Warren, Sadie Haber, NP  acetaminophen (TYLENOL) 500 MG tablet Take 500 mg by mouth every 6 (six) hours as needed for headache.    [provider]  amoxicillin (AMOXIL) 500 MG capsule Take 1 capsule (500 mg total) by mouth 3 (three) times daily. 10/08/22   Ocie Doyne, DMD  Drospirenone (SLYND) 4 MG TABS Take 1 tablet (4 mg total) by mouth daily. Patient not taking: Reported on 10/05/2022 08/13/22   Arabella Merles, CNM  ondansetron (ZOFRAN-ODT) 4 MG disintegrating tablet 4mg  ODT q4 hours prn nausea/vomit Patient not taking: Reported on 10/05/2022 07/22/22   Bethann Berkshire, MD   oxyCODONE-acetaminophen (PERCOCET) 5-325 MG tablet Take 1-2 tablets by mouth every 4 (four) hours as needed. 10/08/22   Ocie Doyne, DMD    Family History Family History  Problem Relation Age of Onset   Osteoporosis Mother    Fibromyalgia Mother    Hypertension Father    Diabetes Maternal Grandmother    Crohn's disease Maternal Grandmother    Diabetes Maternal Grandfather    COPD Maternal Grandfather    Diabetes Paternal Grandmother     Social History Social History   Tobacco Use   Smoking status: Former    Types: Cigarettes   Smokeless tobacco: Never  Vaping Use   Vaping Use: Every day   Substances: Nicotine, Flavoring  Substance Use Topics   Alcohol use: Never   Drug use: Not Currently    Frequency: 7.0 times per week    Types: Marijuana    Comment: smoked last on 08/14/22     Allergies   Patient has no known allergies.   Review of Systems Review of Systems Per HPI  Physical Exam Triage Vital Signs ED Triage Vitals  Enc Vitals Group     BP 12/04/22 1504 123/89     Pulse Rate 12/04/22 1504 (!) 112     Resp 12/04/22 1504 17     Temp 12/04/22 1504 97.9 F (36.6 C)     Temp Source 12/04/22 1504 Oral     SpO2 12/04/22 1504 96 %     Weight --      Height --      Head Circumference --      Peak Flow --      Pain Score 12/04/22 1506 5     Pain Loc --      Pain Edu? --      Excl. in GC? --    No data found.  Updated Vital Signs BP 123/89 (BP Location: Right Arm)   Pulse (!) 112   Temp 97.9 F (36.6 C) (Oral)   Resp 17   LMP 11/22/2022 (Exact Date)   SpO2 96%   Breastfeeding No   Visual Acuity Right Eye Distance:   Left Eye Distance:   Bilateral Distance:    Right Eye Near:   Left Eye Near:    Bilateral Near:     Physical Exam Vitals and nursing note reviewed.  Constitutional:      General: She is not in acute distress.    Appearance: She is well-developed.  HENT:     Head: Normocephalic.     Right Ear: Tympanic membrane and ear  canal normal.     Left Ear: Tympanic membrane and ear canal normal.     Nose: Congestion present. No rhinorrhea.     Mouth/Throat:     Mouth: Mucous membranes are moist. No oral lesions.  Pharynx: Pharyngeal swelling and posterior oropharyngeal erythema present. No oropharyngeal exudate or uvula swelling.     Tonsils: No tonsillar exudate or tonsillar abscesses. 1+ on the right. 1+ on the left.  Eyes:     Conjunctiva/sclera: Conjunctivae normal.     Pupils: Pupils are equal, round, and reactive to light.  Cardiovascular:     Rate and Rhythm: Regular rhythm. Tachycardia present.     Heart sounds: Normal heart sounds.  Pulmonary:     Effort: Pulmonary effort is normal. No respiratory distress.     Breath sounds: Normal breath sounds. No stridor. No wheezing, rhonchi or rales.  Abdominal:     General: Bowel sounds are normal.     Palpations: Abdomen is soft.  Musculoskeletal:     Cervical back: Normal range of motion.  Lymphadenopathy:     Cervical: Cervical adenopathy present.  Skin:    General: Skin is warm and dry.  Neurological:     General: No focal deficit present.     Mental Status: She is alert and oriented to person, place, and time.  Psychiatric:        Mood and Affect: Mood normal.        Behavior: Behavior normal.      UC Treatments / Results  Labs (all labs ordered are listed, but only abnormal results are displayed) Labs Reviewed  CULTURE, GROUP A STREP [facility])  POCT RAPID STREP A (OFFICE)  POCT MONO SCREEN ([facility])    EKG   Radiology No results found.  Procedures Procedures (including critical care time)  Medications Ordered in UC Medications - No data to display  Initial Impression / Assessment and Plan / UC Course  I have reviewed the triage vital signs and the nursing notes.  Pertinent labs & imaging results that were available during my care of the patient were reviewed by me and considered in my medical decision making (see chart for  details).  The patient is well-appearing, she is in no acute distress, vital signs are stable.  Rapid strep test and Monospot test were negative.  Throat culture is pending.  Symptoms appear to be consistent with a viral upper respiratory infection with cough.  Patient was treated with Bromfed-DM for cough, cetirizine 10 mg for nasal congestion and runny nose, and fluticasone 50 mcg nasal spray for nasal congestion and runny nose.  Supportive care recommendations were provided and discussed with the patient to include increasing fluids, allowing for plenty of rest, over-the-counter analgesics for pain or discomfort, and warm salt water gargles.  Patient was advised that if symptoms or not improving over the next 7 to 10 days or if they suddenly worsen, recommend that she follow-up in this clinic or with her PCP for further evaluation.  Patient was in agreement with this plan of care and verbalizes understanding.  All questions were answered.  Patient stable for discharge.  Final Clinical Impressions(s) / UC Diagnoses   Final diagnoses:  Sore throat  Viral upper respiratory tract infection with cough     Discharge Instructions      The rapid strep test and Monospot test were negative.  A throat culture is pending.  As discussed, you will be contacted if the pending test result is positive to discuss treatment. Take medication as prescribed. Increase fluids and allow for plenty of rest. Recommend warm salt water gargles 3-4 times daily while symptoms persist. Recommend a soft diet to include soup, broth, yogurt, pudding, or Jell-O while throat pain persist. If symptoms  or not improving over the next 7 to 10 days, or if they suddenly worsen, please follow-up in this clinic or with your primary care physician for further evaluation. Follow-up as needed.     ED Prescriptions     Medication Sig Dispense Auth. Provider   brompheniramine-pseudoephedrine-DM 30-2-10 MG/5ML syrup Take 5 mLs by  mouth 4 (four) times daily as needed. 140 mL Zackeriah Kissler-Warren, Sadie Haber, NP   fluticasone (FLONASE) 50 MCG/ACT nasal spray Place 2 sprays into both nostrils daily. 16 g Talma Aguillard-Warren, Sadie Haber, NP   cetirizine (ZYRTEC) 10 MG tablet Take 1 tablet (10 mg total) by mouth daily. 30 tablet Modelle Vollmer-Warren, Sadie Haber, NP      PDMP not reviewed this encounter.   Abran Cantor, NP 12/04/22 1557

## 2022-12-04 NOTE — Discharge Instructions (Addendum)
The rapid strep test and Monospot test were negative.  A throat culture is pending.  As discussed, you will be contacted if the pending test result is positive to discuss treatment. Take medication as prescribed. Increase fluids and allow for plenty of rest. Recommend warm salt water gargles 3-4 times daily while symptoms persist. Recommend a soft diet to include soup, broth, yogurt, pudding, or Jell-O while throat pain persist. If symptoms or not improving over the next 7 to 10 days, or if they suddenly worsen, please follow-up in this clinic or with your primary care physician for further evaluation. Follow-up as needed.

## 2022-12-04 NOTE — ED Triage Notes (Signed)
Pt c/o sore throat right side right side neck pain, stuffy nose, headache. Daughter was sick first. Diagnosed with a viral infection and was told to left it run its course.

## 2022-12-05 LAB — CULTURE, GROUP A STREP (THRC)

## 2022-12-07 LAB — CULTURE, GROUP A STREP (THRC)

## 2022-12-15 ENCOUNTER — Telehealth: Payer: Medicaid Other | Admitting: Nurse Practitioner

## 2022-12-15 DIAGNOSIS — J40 Bronchitis, not specified as acute or chronic: Secondary | ICD-10-CM | POA: Diagnosis not present

## 2022-12-15 MED ORDER — BENZONATATE 100 MG PO CAPS: 100.0000 mg | ORAL_CAPSULE | Freq: Three times a day (TID) | ORAL | 0 refills | Status: DC | PRN

## 2022-12-15 MED ORDER — AZITHROMYCIN 250 MG PO TABS
ORAL_TABLET | ORAL | 0 refills | Status: AC
Start: 2022-12-15 — End: 2022-12-20

## 2022-12-15 NOTE — Progress Notes (Signed)
E-Visit for Cough  We are sorry that you are not feeling well.  Here is how we plan to help!  Your sore throat is likely due to the congestion you were experiencing, post nasal drainage and now the cough. Along with the following you can use throat lozenges or throat coat tea, and ibuprofen to help with the sore throat.   Based on your presentation I believe you most likely have A cough due to bacteria.  When patients have a fever and a productive cough with a change in color or increased sputum production, we are concerned about bacterial bronchitis.  If left untreated it can progress to pneumonia.  If your symptoms do not improve with your treatment plan it is important that you contact your provider.   I have prescribed Azithromyin 250 mg: two tablets now and then one tablet daily for 4 additonal days    In addition you may use A prescription cough medication called Tessalon Perles 100mg . You may take 1-2 capsules every 8 hours as needed for your cough.    From your responses in the eVisit questionnaire you describe inflammation in the upper respiratory tract which is causing a significant cough.  This is commonly called Bronchitis and has four common causes:   Allergies Viral Infections Acid Reflux Bacterial Infection Allergies, viruses and acid reflux are treated by controlling symptoms or eliminating the cause. An example might be a cough caused by taking certain blood pressure medications. You stop the cough by changing the medication. Another example might be a cough caused by acid reflux. Controlling the reflux helps control the cough.  USE OF BRONCHODILATOR ("RESCUE") INHALERS: There is a risk from using your bronchodilator too frequently.  The risk is that over-reliance on a medication which only relaxes the muscles surrounding the breathing tubes can reduce the effectiveness of medications prescribed to reduce swelling and congestion of the tubes themselves.  Although you feel brief  relief from the bronchodilator inhaler, your asthma may actually be worsening with the tubes becoming more swollen and filled with mucus.  This can delay other crucial treatments, such as oral steroid medications. If you need to use a bronchodilator inhaler daily, several times per day, you should discuss this with your provider.  There are probably better treatments that could be used to keep your asthma under control.     HOME CARE Only take medications as instructed by your medical team. Complete the entire course of an antibiotic. Drink plenty of fluids and get plenty of rest. Avoid close contacts especially the very young and the elderly Cover your mouth if you cough or cough into your sleeve. Always remember to wash your hands A steam or ultrasonic humidifier can help congestion.   GET HELP RIGHT AWAY IF: You develop worsening fever. You become short of breath You cough up blood. Your symptoms persist after you have completed your treatment plan MAKE SURE YOU  Understand these instructions. Will watch your condition. Will get help right away if you are not doing well or get worse.    Thank you for choosing an e-visit.  Your e-visit answers were reviewed by a board certified advanced clinical practitioner to complete your personal care plan. Depending upon the condition, your plan could have included both over the counter or prescription medications.  Please review your pharmacy choice. Make sure the pharmacy is open so you can pick up prescription now. If there is a problem, you may contact your provider through [employer] and  have the prescription routed to another pharmacy.  Your safety is important to [name]. If you have drug allergies check your prescription carefully.   For the next 24 hours you can use MyChart to ask questions about today's visit, request a non-urgent call back, or ask for a work or school excuse. You will get an email in the next two days asking about  your experience. I hope that your e-visit has been valuable and will speed your recovery.   Meds ordered this encounter  Medications   azithromycin (ZITHROMAX) 250 MG tablet    Sig: Take 2 tablets on day 1, then 1 tablet daily on days 2 through 5    Dispense:  6 tablet    Refill:  0   benzonatate (TESSALON) 100 MG capsule    Sig: Take 1 capsule (100 mg total) by mouth 3 (three) times daily as needed.    Dispense:  30 capsule    Refill:  0     I spent approximately 5 minutes reviewing the patient's history, current symptoms and coordinating their care today.

## 2022-12-16 DIAGNOSIS — J019 Acute sinusitis, unspecified: Secondary | ICD-10-CM | POA: Diagnosis not present

## 2022-12-16 DIAGNOSIS — Z1152 Encounter for screening for COVID-19: Secondary | ICD-10-CM | POA: Diagnosis not present

## 2022-12-16 DIAGNOSIS — J029 Acute pharyngitis, unspecified: Secondary | ICD-10-CM | POA: Diagnosis present

## 2022-12-17 ENCOUNTER — Other Ambulatory Visit: Payer: Self-pay

## 2022-12-17 ENCOUNTER — Emergency Department (HOSPITAL_COMMUNITY)
Admission: EM | Admit: 2022-12-17 | Discharge: 2022-12-17 | Disposition: A | Discharge status: Home / Self Care | Payer: Medicaid Other | Attending: Emergency Medicine | Admitting: Emergency Medicine

## 2022-12-17 ENCOUNTER — Encounter (HOSPITAL_COMMUNITY): Payer: Self-pay | Admitting: Emergency Medicine

## 2022-12-17 DIAGNOSIS — J019 Acute sinusitis, unspecified: Secondary | ICD-10-CM

## 2022-12-17 LAB — RESP PANEL BY RT-PCR (RSV, FLU A&B, COVID)  RVPGX2
Influenza A by PCR: NEGATIVE
Influenza B by PCR: NEGATIVE
Resp Syncytial Virus by PCR: NEGATIVE
SARS Coronavirus 2 by RT PCR: NEGATIVE

## 2022-12-17 LAB — GROUP A STREP BY PCR: Group A Strep by PCR: NOT DETECTED

## 2022-12-17 MED ORDER — PREDNISONE 50 MG PO TABS
60.0000 mg | ORAL_TABLET | Freq: Once | ORAL | Status: AC
  Administered 2022-12-17: 60 mg via ORAL

## 2022-12-17 MED ORDER — PREDNISONE 20 MG PO TABS
ORAL_TABLET | ORAL | Status: AC
  Filled 2022-12-17: qty 3

## 2022-12-17 MED ORDER — PREDNISONE 20 MG PO TABS: 40.0000 mg | ORAL_TABLET | Freq: Every day | ORAL | 0 refills | Status: DC

## 2022-12-17 NOTE — ED Triage Notes (Signed)
Pt in with cold/congestive symptoms x 3 wks, has seen doc for this and tested negative for Mono or Strep on 7/6, was placed on cough medicine and Flonase. Past few days, pt has a worsened sore throat, denies any known fevers

## 2022-12-17 NOTE — ED Notes (Signed)
Patient verbalizes understanding of discharge instructions. Opportunity for questioning and answers were provided. Armband removed by staff, pt discharged from ED. Ambulated out to lobby  

## 2022-12-17 NOTE — ED Provider Notes (Signed)
EMERGENCY DEPARTMENT AT [facility] Provider Note   CSN: [id number] Arrival date & time: 12/16/22  2345     History  Chief Complaint  Patient presents with   Sore Throat    Stefanie Farmer is a 27 y.o. female.  Patient presents with URI symptoms.  Symptoms present for 3 weeks.  Patient with persistent sore throat, sinus congestion.  It started with a cough which is still present but not as severe.  She was treated initially with cold medications by her doctor.  She did an e-visit 2 days ago and was prescribed Zithromax and Tessalon Perles.  Patient still reports pain with swallowing.       Home Medications Prior to Admission medications   Medication Sig Start Date End Date Taking? Authorizing Provider  acetaminophen (TYLENOL) 500 MG tablet Take 500 mg by mouth every 6 (six) hours as needed for headache.    [provider]  amoxicillin (AMOXIL) 500 MG capsule Take 1 capsule (500 mg total) by mouth 3 (three) times daily. 10/08/22   Ocie Doyne, DMD  azithromycin (ZITHROMAX) 250 MG tablet Take 2 tablets on day 1, then 1 tablet daily on days 2 through 5 12/15/22 12/20/22  Viviano Simas, FNP  benzonatate (TESSALON) 100 MG capsule Take 1 capsule (100 mg total) by mouth 3 (three) times daily as needed. 12/15/22   Viviano Simas, FNP  brompheniramine-pseudoephedrine-DM 30-2-10 MG/5ML syrup Take 5 mLs by mouth 4 (four) times daily as needed. 12/04/22   Leath-Warren, Sadie Haber, NP  cetirizine (ZYRTEC) 10 MG tablet Take 1 tablet (10 mg total) by mouth daily. 12/04/22   Leath-Warren, Sadie Haber, NP  Drospirenone (SLYND) 4 MG TABS Take 1 tablet (4 mg total) by mouth daily. Patient not taking: Reported on 10/05/2022 08/13/22   Arabella Merles, CNM  fluticasone [facility]) 50 MCG/ACT nasal spray Place 2 sprays into both nostrils daily. 12/04/22   Leath-Warren, Sadie Haber, NP  ondansetron (ZOFRAN-ODT) 4 MG disintegrating tablet 4mg  ODT q4 hours prn nausea/vomit Patient not  taking: Reported on 10/05/2022 07/22/22   Bethann Berkshire, MD  oxyCODONE-acetaminophen (PERCOCET) 5-325 MG tablet Take 1-2 tablets by mouth every 4 (four) hours as needed. 10/08/22   Ocie Doyne, DMD      Allergies    Patient has no known allergies.    Review of Systems   Review of Systems  Physical Exam Updated Vital Signs BP 122/85   Pulse 94   Temp 98.6 F (37 C) (Oral)   Resp 20   Wt 131.5 kg   LMP 11/22/2022 (Exact Date)   SpO2 99%   BMI 49.76 kg/m  Physical Exam Vitals and nursing note reviewed.  Constitutional:      General: She is not in acute distress.    Appearance: She is well-developed.  HENT:     Head: Normocephalic and atraumatic.     Mouth/Throat:     Mouth: Mucous membranes are moist.  Eyes:     General: Vision grossly intact. Gaze aligned appropriately.     Extraocular Movements: Extraocular movements intact.     Conjunctiva/sclera: Conjunctivae normal.  Cardiovascular:     Rate and Rhythm: Normal rate and regular rhythm.     Pulses: Normal pulses.     Heart sounds: Normal heart sounds, S1 normal and S2 normal. No murmur heard.    No friction rub. No gallop.  Pulmonary:     Effort: Pulmonary effort is normal. No respiratory distress.     Breath sounds: Normal  breath sounds.  Abdominal:     General: Bowel sounds are normal.     Palpations: Abdomen is soft.     Tenderness: There is no abdominal tenderness. There is no guarding or rebound.     Hernia: No hernia is present.  Musculoskeletal:        General: No swelling.     Cervical back: Full passive range of motion without pain, normal range of motion and neck supple. No spinous process tenderness or muscular tenderness. Normal range of motion.     Right lower leg: No edema.     Left lower leg: No edema.  Skin:    General: Skin is warm and dry.     Capillary Refill: Capillary refill takes less than 2 seconds.     Findings: No ecchymosis, erythema, rash or wound.  Neurological:     General: No  focal deficit present.     Mental Status: She is alert and oriented to person, place, and time.     GCS: GCS eye subscore is 4. GCS verbal subscore is 5. GCS motor subscore is 6.     Cranial Nerves: Cranial nerves 2-12 are intact.     Sensory: Sensation is intact.     Motor: Motor function is intact.     Coordination: Coordination is intact.  Psychiatric:        Attention and Perception: Attention normal.        Mood and Affect: Mood normal.        Speech: Speech normal.        Behavior: Behavior normal.     ED Results / Procedures / Treatments   Labs (all labs ordered are listed, but only abnormal results are displayed) Labs Reviewed  GROUP A STREP BY PCR  RESP PANEL BY RT-PCR (RSV, FLU A&B, COVID)  [id number]    EKG None  Radiology No results found.  Procedures Procedures    Medications Ordered in ED Medications - No data to display  ED Course/ Medical Decision Making/ A&P                             Medical Decision Making  Differential Diagnosis considered includes, but not limited to: COVID-19; influenza; RSV; simple viral URI; pneumonia  3 weeks of sinus congestion and sore throat.  Oropharyngeal examination unremarkable.  COVID-negative, strep negative.  Suspect sinus drainage causing a sore throat.  Treat for sinusitis as symptoms have been ongoing for 3 weeks.        Final Clinical Impression(s) / ED Diagnoses Final diagnoses:  Acute sinusitis, recurrence not specified, unspecified location    Rx / DC Orders ED Discharge Orders     None         Marcile Fuquay, Canary Brim, MD 12/17/22 0149

## 2022-12-29 ENCOUNTER — Ambulatory Visit: Payer: Medicaid Other | Admitting: Internal Medicine

## 2023-01-06 IMAGING — CT CT ABD-PELV W/ CM
2 of 4 series · 16 of 46 positions shown, 18 images · IV contrast (Omnipaque or Isovue)
Comparison: None.

CLINICAL DATA: Right flank pain, concern for kidney stone.

EXAM:
CT ABDOMEN AND PELVIS WITH CONTRAST
TECHNIQUE: Multidetector CT imaging of the abdomen and pelvis was performed
using the standard protocol following bolus administration of
intravenous contrast.

[Series 2: axial st · axial · 0.81mm/px · z∈[+823,+1308]mm · 13 of 107 slices shown, 15 images]
[im 5/107  soft-tissue]
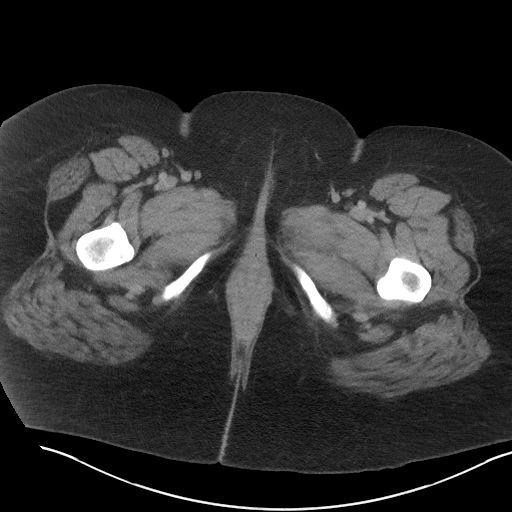
[im 5/107  bone]
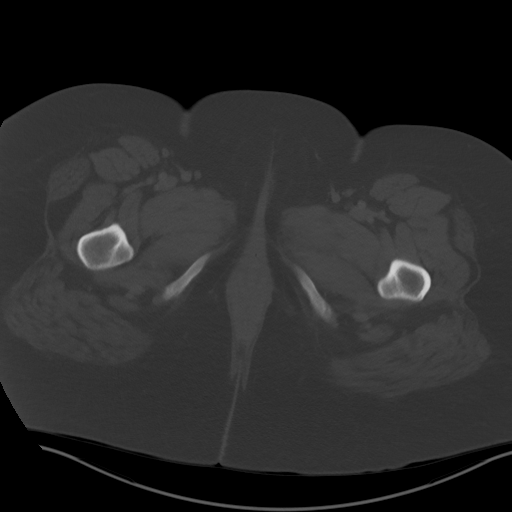
[im 14/107  soft-tissue]
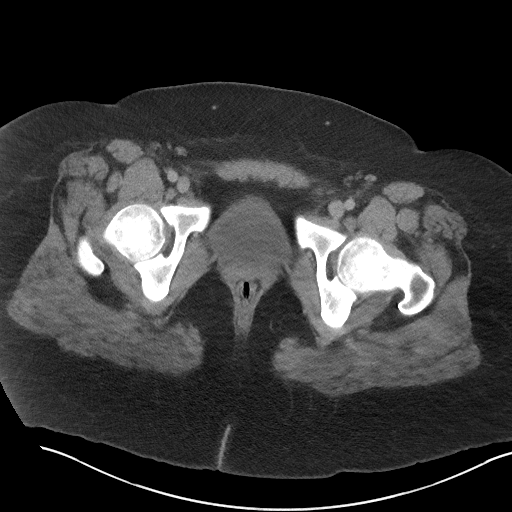
[im 23/107  soft-tissue]
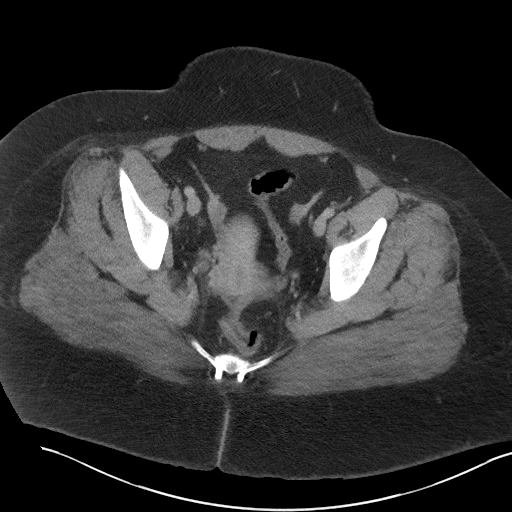
[im 31/107  soft-tissue]
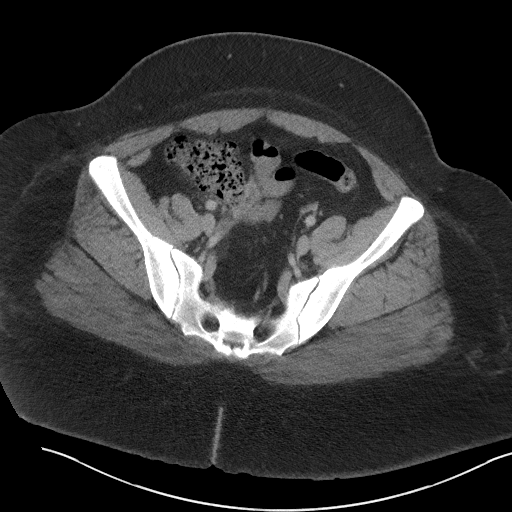
[im 36/107  soft-tissue]
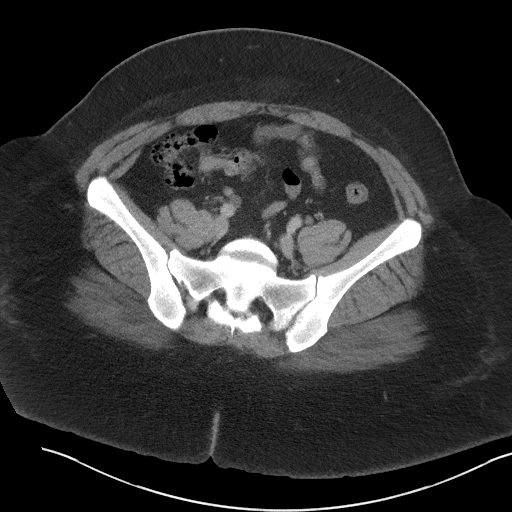
[im 45/107  soft-tissue]
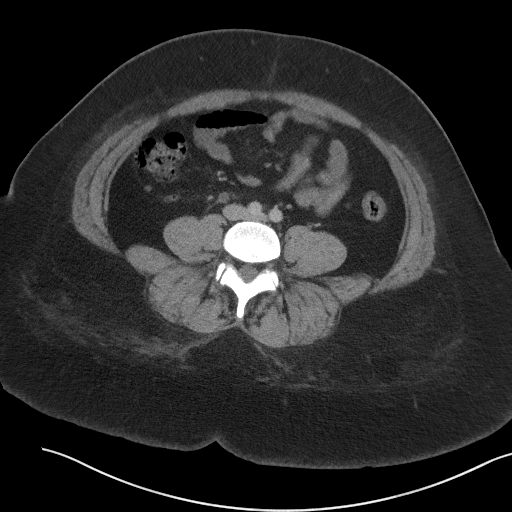
[im 54/107  soft-tissue]
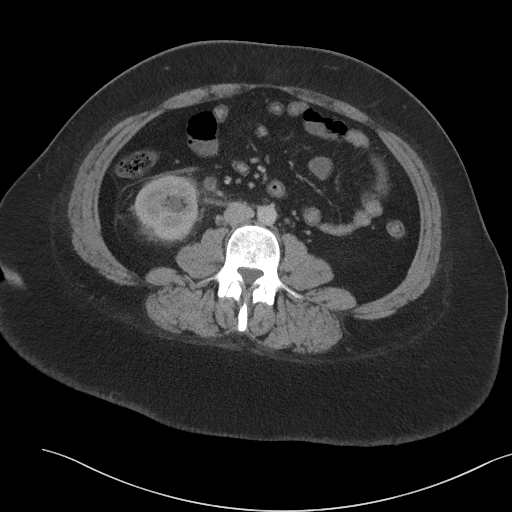
[im 62/107  soft-tissue]
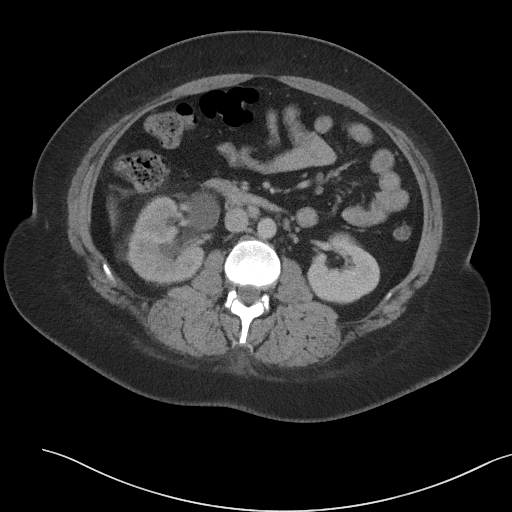
[im 71/107  soft-tissue]
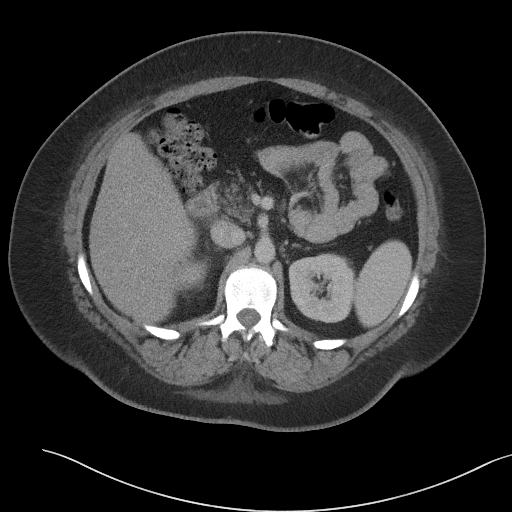
[im 71/107  bone]
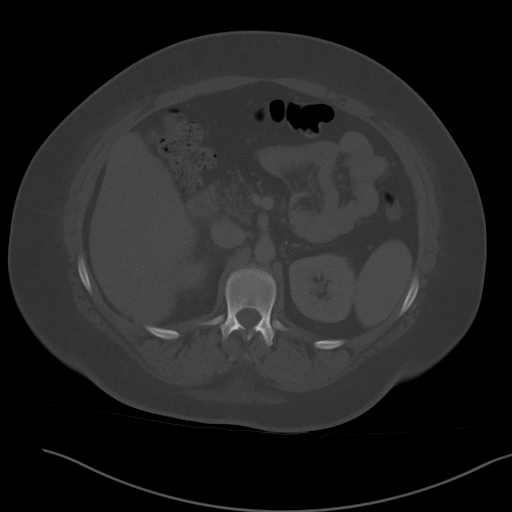
[im 76/107  soft-tissue]
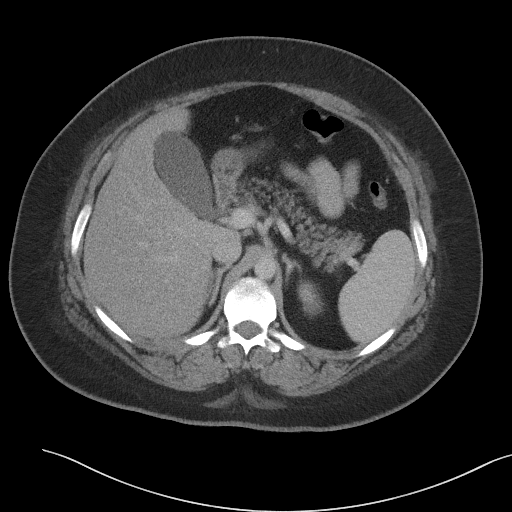
[im 84/107  soft-tissue]
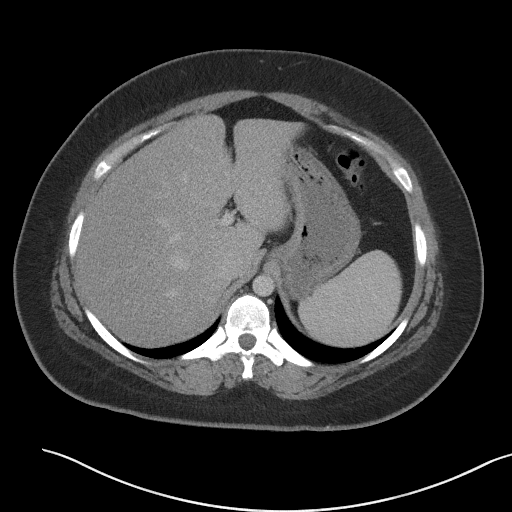
[im 93/107  soft-tissue]
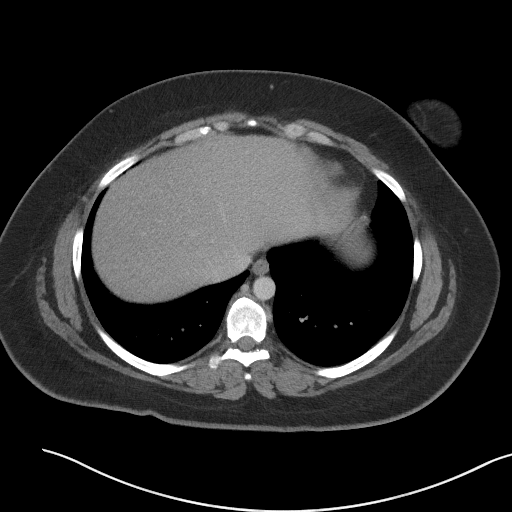
[im 102/107  soft-tissue]
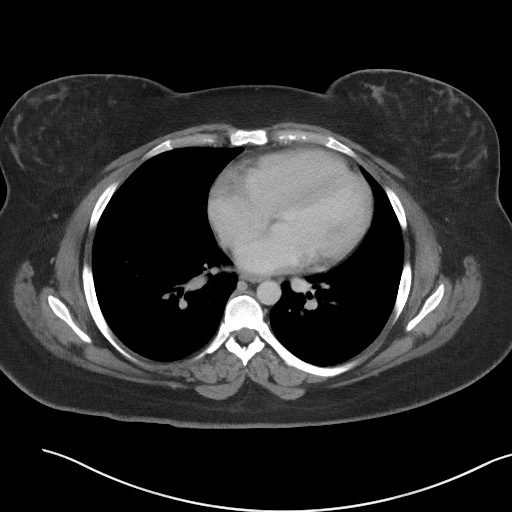

[Series 5: coronal st · coronal · 0.91mm/px · 3 of 117 slices shown]
[im 39/117  soft-tissue]
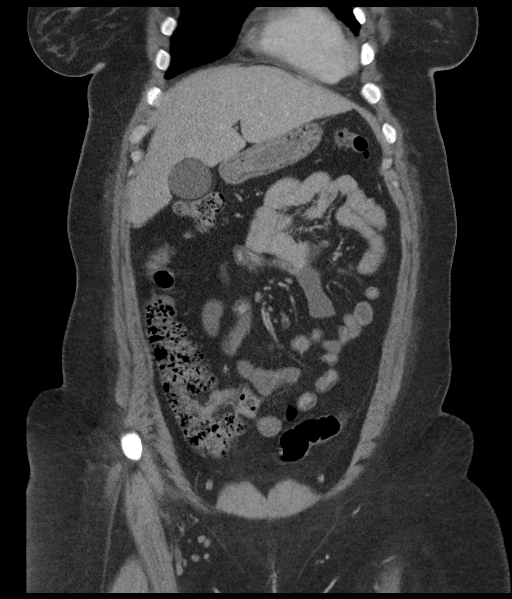
[im 52/117  soft-tissue]
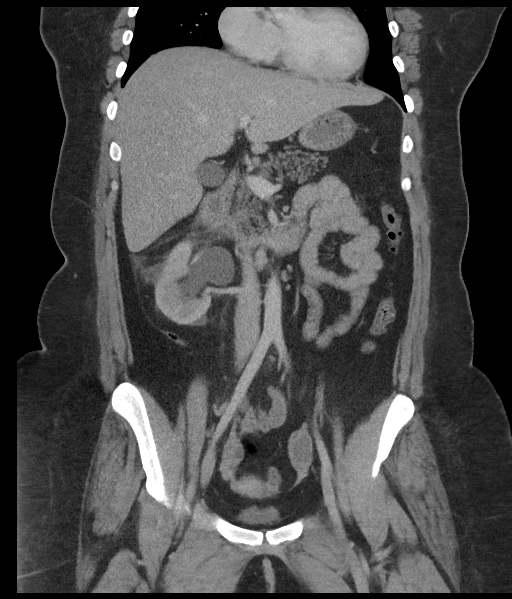
[im 65/117  soft-tissue]
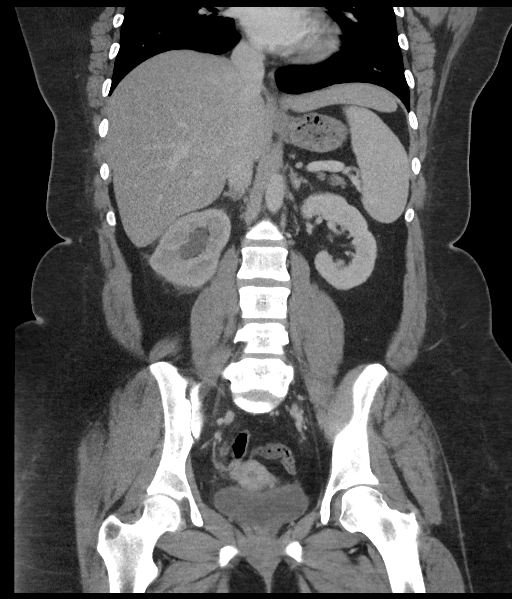

[16 of 46 positions shown; findings below may reference images not displayed]

RADIATION DOSE REDUCTION: This exam was performed according to the
departmental dose-optimization program which includes automated
exposure control, adjustment of the mA and/or kV according to
patient size and/or use of iterative reconstruction technique.

CONTRAST:  100mL OMNIPAQUE IOHEXOL 300 MG/ML  SOLN
FINDINGS: Lower chest: No acute abnormality.

Hepatobiliary: No focal liver abnormality is seen. No gallstones,
gallbladder wall thickening, or biliary dilatation.

Pancreas: Unremarkable. No pancreatic ductal dilatation or
surrounding inflammatory changes.

Spleen: Normal in size without focal abnormality.

Adrenals/Urinary Tract: Adrenal glands are unremarkable. An
obstructing 9 mm calculus is seen in the distal right ureter
resulting in moderate right hydroureteronephrosis. There is moderate
edema surrounding the right kidney. No calculi are seen in the right
kidney. The left kidney is normal, without renal calculi, focal
lesion, or hydronephrosis. Bladder is unremarkable.

Stomach/Bowel: Stomach is within normal limits. Appendix appears
normal. No evidence of bowel wall thickening, distention, or
inflammatory changes.

Vascular/Lymphatic: No significant vascular findings are present. No
enlarged abdominal or pelvic lymph nodes.

Reproductive: Uterus and bilateral adnexa are unremarkable.

Other: No abdominal wall hernia or abnormality. No abdominopelvic
ascites.

Musculoskeletal: There is a pars defect on the left at L5.
IMPRESSION: Obstructing 9 mm distal right ureteral calculus results in moderate
right hydroureteronephrosis.

## 2023-02-03 LAB — LAB REPORT - SCANNED
A1c: 5.4
EGFR: 122
TSH: 0.94 (ref 0.41–5.90)

## 2023-03-09 IMAGING — US US RENAL
1 series · 14 of 25 positions shown · non-contrast
Comparison: CT abdomen and pelvis 07/22/2021

CLINICAL DATA: Ureteral stone follow-up

EXAM:
RENAL / URINARY TRACT ULTRASOUND COMPLETE

[Series 1: us renal · 14 of 42 slices shown]
[im 1/42]
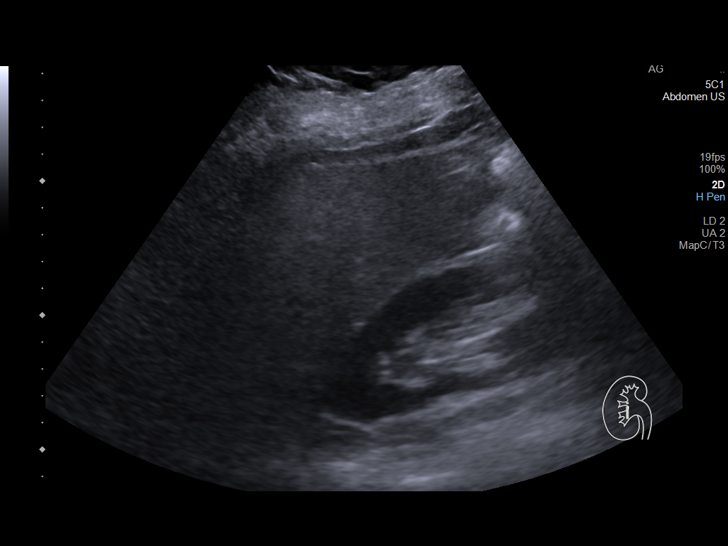
[im 4/42]
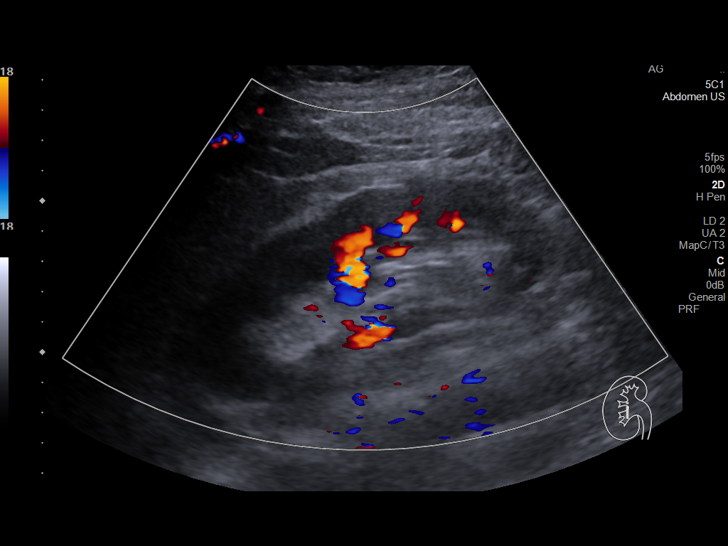
[im 7/42]
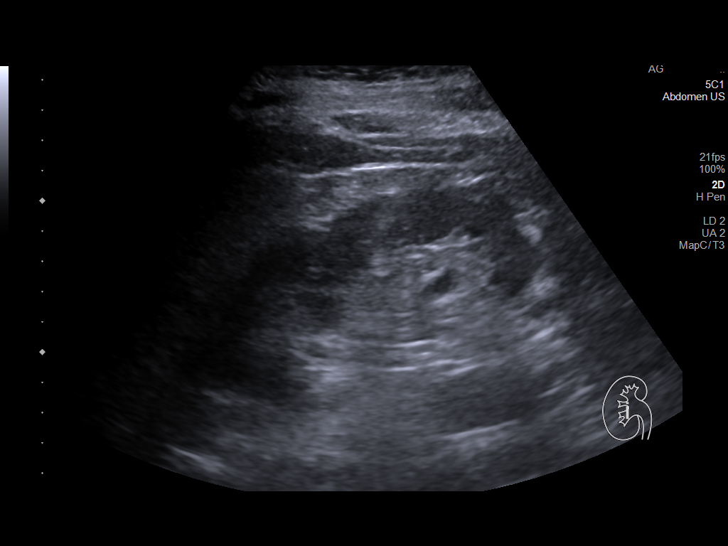
[im 11/42]
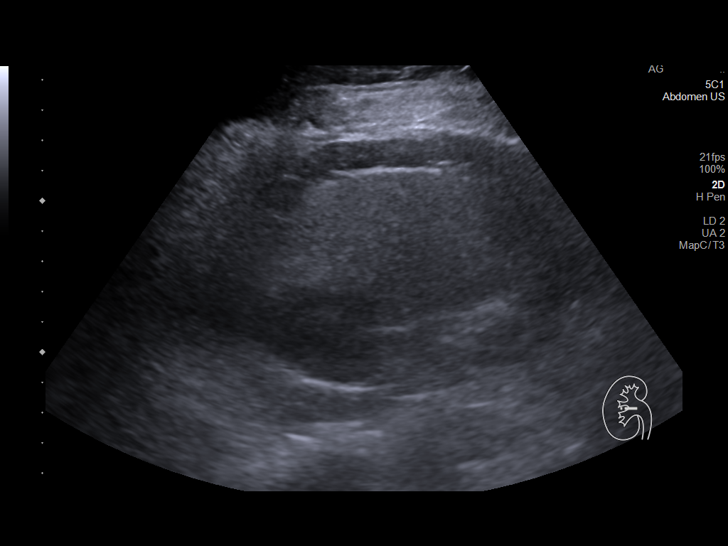
[im 14/42]
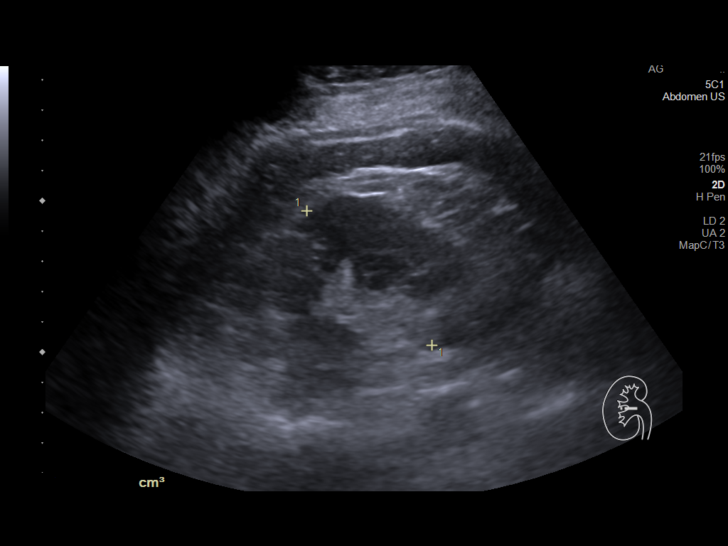
[im 16/42]
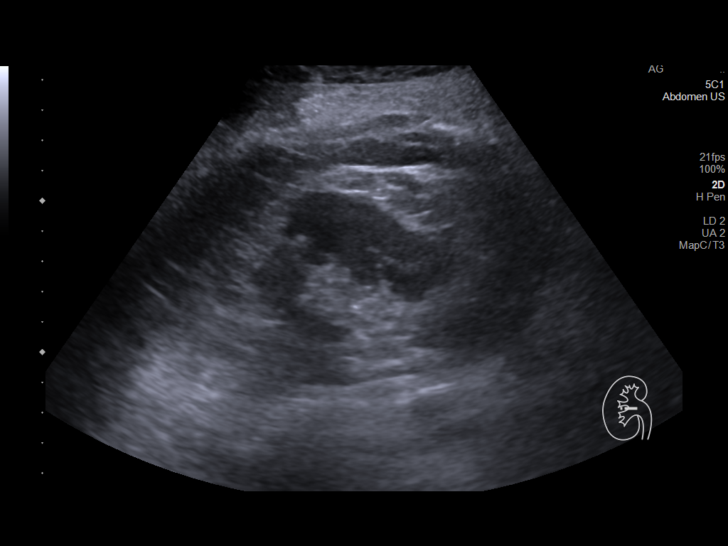
[im 19/42]
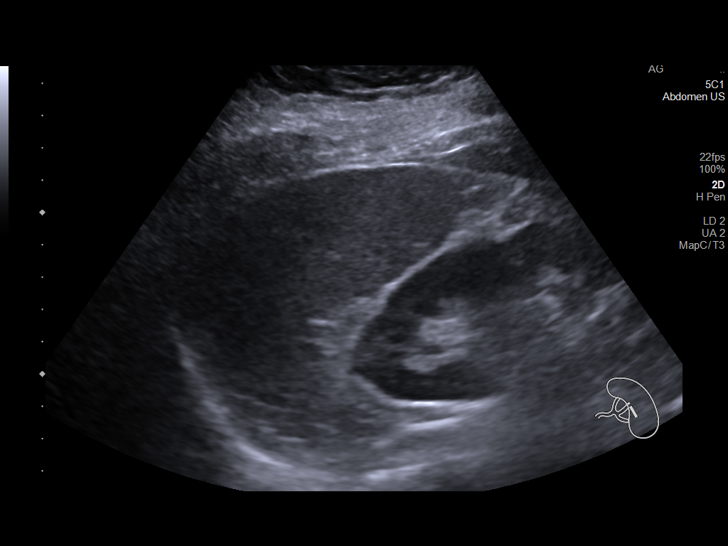
[im 23/42]
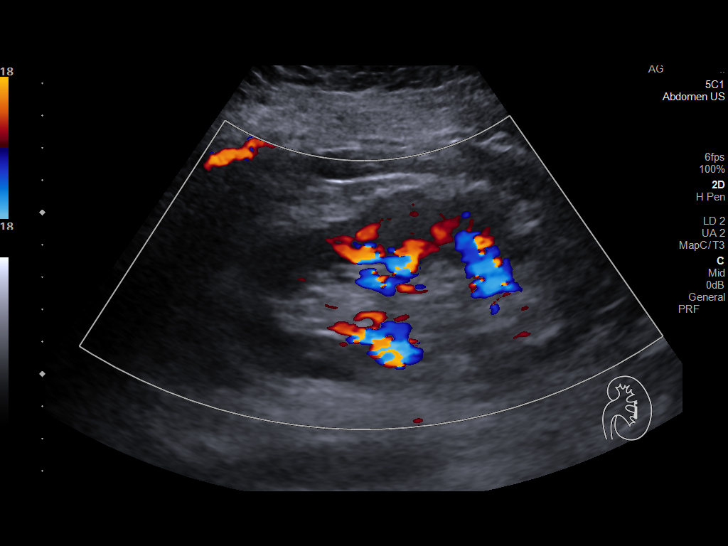
[im 26/42]
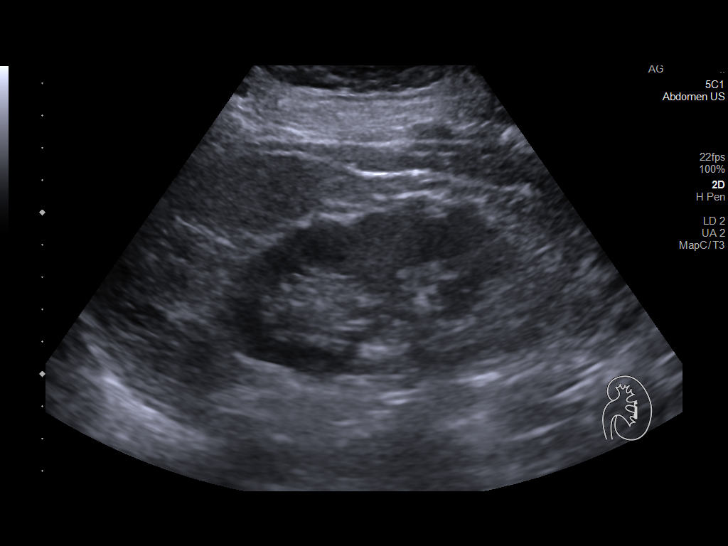
[im 28/42]
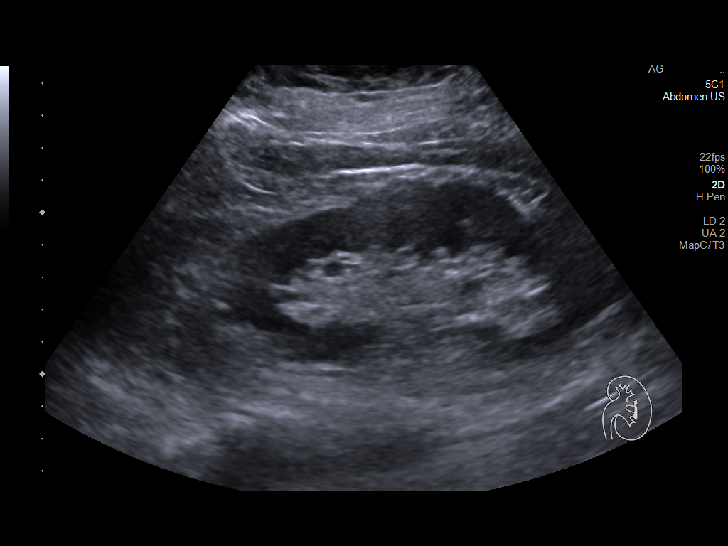
[im 31/42]
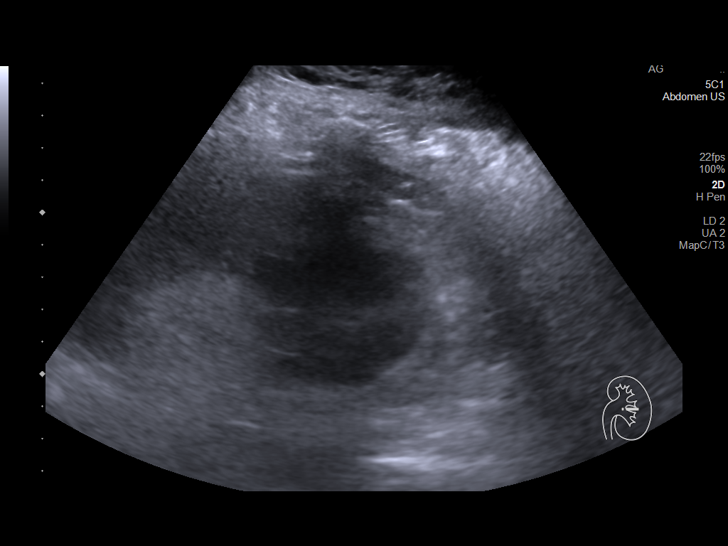
[im 35/42]
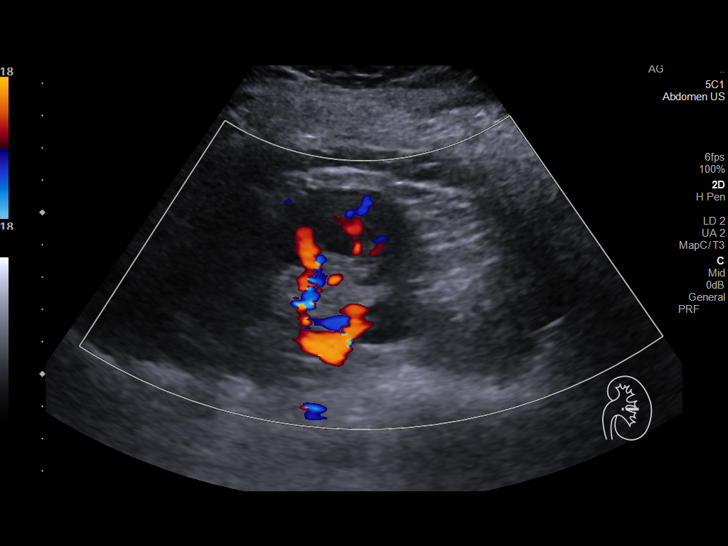
[im 38/42]
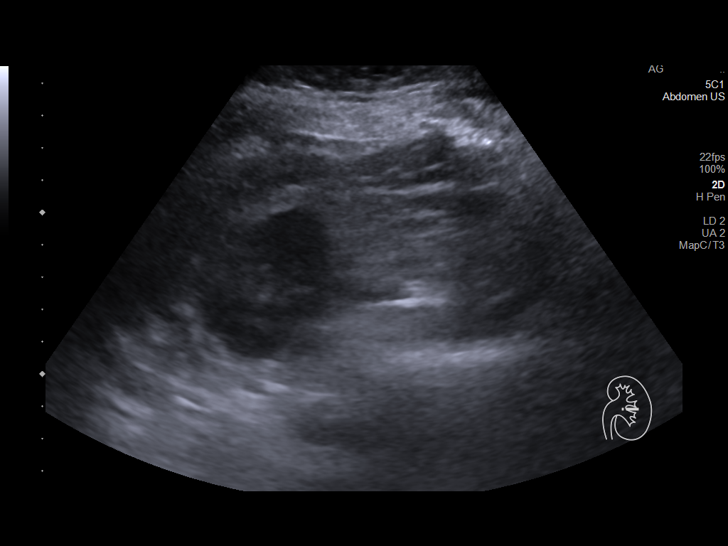
[im 42/42]
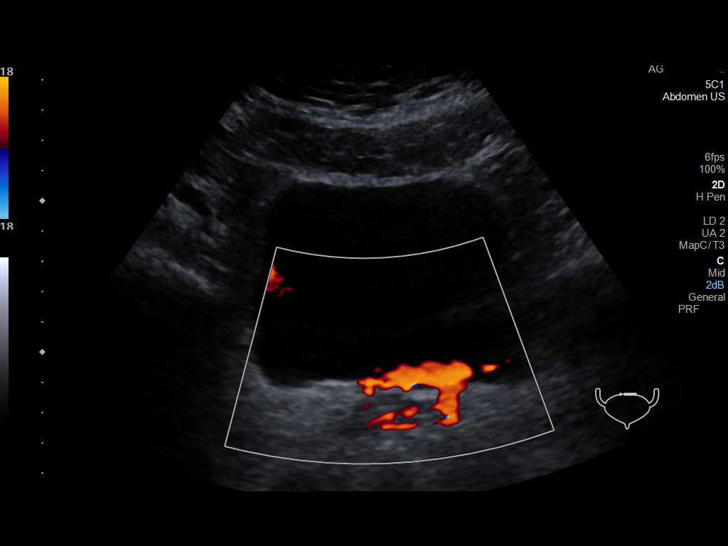

[14 of 25 positions shown; findings below may reference images not displayed]

FINDINGS: Right Kidney:

Renal measurements: 12 x 5.4 x 6.1 cm = volume: 206 mL. Echogenicity
within normal limits. No mass or hydronephrosis visualized.

Left Kidney:

Renal measurements: 12.3 x 6.2 x 5.9 cm = volume: 237 mL.
Echogenicity within normal limits. No mass or hydronephrosis
visualized.

Bladder:

Appears normal for degree of bladder distention.

Other:

None.
IMPRESSION: No nephrolithiasis or hydronephrosis visualized.

## 2024-04-19 ENCOUNTER — Other Ambulatory Visit: Payer: Self-pay | Admitting: Medical Genetics

## 2024-04-27 ENCOUNTER — Other Ambulatory Visit (HOSPITAL_COMMUNITY)

## 2024-05-04 ENCOUNTER — Ambulatory Visit: Admitting: Family Medicine

## 2024-05-04 ENCOUNTER — Telehealth: Payer: Self-pay | Admitting: Family Medicine

## 2024-05-04 VITALS — BP 130/84 | HR 90 | Temp 98.1°F | Ht 64.0 in | Wt 313.8 lb

## 2024-05-04 DIAGNOSIS — Z130 Encounter for screening for diseases of the blood and blood-forming organs and certain disorders involving the immune mechanism: Secondary | ICD-10-CM

## 2024-05-04 DIAGNOSIS — Z136 Encounter for screening for cardiovascular disorders: Secondary | ICD-10-CM

## 2024-05-04 DIAGNOSIS — Z8639 Personal history of other endocrine, nutritional and metabolic disease: Secondary | ICD-10-CM

## 2024-05-04 DIAGNOSIS — Z6841 Body Mass Index (BMI) 40.0 and over, adult: Secondary | ICD-10-CM

## 2024-05-04 DIAGNOSIS — R5383 Other fatigue: Secondary | ICD-10-CM

## 2024-05-04 DIAGNOSIS — Z23 Encounter for immunization: Secondary | ICD-10-CM

## 2024-05-04 LAB — BAYER DCA HB A1C WAIVED: HB A1C (BAYER DCA - WAIVED): 4.9 % (ref 4.8–5.6)

## 2024-05-04 NOTE — Progress Notes (Signed)
 New Patient Office Visit  Patient ID: Stefanie Farmer, Female   DOB: 13-Feb-1996 28 y.o. MRN: [id number]  Chief Complaint  Patient presents with   Establish Care   Obesity    Patient states that she had a baby in Feb 2024 and she was about 330 lb right before giving birth. She had her gallbladder taken out right after giving birth and she got down to about 285 lbs and steadily for the past 6 months or so has been gaining weight. Patient says the last time she had her lab work done was at [facility] in [place] and her thyroid level was on the low end. Feels this may be a reason for her weight gain. Patient does report that she is not the healthiest eater but doesn't over eat.    Subjective:     [name] presents to establish care  HPI  Discussed the use of AI scribe software for clinical note transcription with the patient, who gave verbal consent to proceed.  History of Present Illness   Stefanie Farmer is a 28 year old female who presents today to establish care.   Weight gain and obesity - Pre-pregnancy weight: 330 pounds - Postpartum weight (February 2024): 285 pounds - Progressive weight gain over the past six months without significant lifestyle changes - Current weight: increasing since postpartum - Interested in weight management options - Patient mother has history of having gastric bypass/sleeve surgery due to obesity.   Thyroid concerns - Patient reports that her thyroid levels were at the low end of normal last year. Did not require treatment.    Fatigue - Fatigue often - No recent laboratory evaluation. - Has a 26 year old at home and is in school.  - Reports hx of low iron.   Post-cholecystectomy status - Underwent cholecystectomy approximately one month postpartum       Outpatient Encounter Medications as of 05/04/2024  Medication Sig   [DISCONTINUED] acetaminophen  (TYLENOL ) 500 MG tablet Take 500 mg by mouth every 6 (six) hours as needed for headache.    [DISCONTINUED] amoxicillin  (AMOXIL ) 500 MG capsule Take 1 capsule (500 mg total) by mouth 3 (three) times daily.   [DISCONTINUED] benzonatate  (TESSALON ) 100 MG capsule Take 1 capsule (100 mg total) by mouth 3 (three) times daily as needed.   [DISCONTINUED] brompheniramine-pseudoephedrine-DM 30-2-10 MG/5ML syrup Take 5 mLs by mouth 4 (four) times daily as needed.   [DISCONTINUED] cetirizine  (ZYRTEC ) 10 MG tablet Take 1 tablet (10 mg total) by mouth daily.   [DISCONTINUED] Drospirenone  (SLYND ) 4 MG TABS Take 1 tablet (4 mg total) by mouth daily. (Patient not taking: Reported on 10/05/2022)   [DISCONTINUED] fluticasone  (FLONASE ) 50 MCG/ACT nasal spray Place 2 sprays into both nostrils daily.   [DISCONTINUED] ondansetron  (ZOFRAN -ODT) 4 MG disintegrating tablet 4mg  ODT q4 hours prn nausea/vomit (Patient not taking: Reported on 10/05/2022)   [DISCONTINUED] oxyCODONE -acetaminophen  (PERCOCET) 5-325 MG tablet Take 1-2 tablets by mouth every 4 (four) hours as needed.   [DISCONTINUED] predniSONE  (DELTASONE ) 20 MG tablet Take 2 tablets (40 mg total) by mouth daily with breakfast.   No facility-administered encounter medications on file as of 05/04/2024.    Past Medical History:  Diagnosis Date   History of kidney stones    Hypertension    Kidney stone     Past Surgical History:  Procedure Laterality Date   CHOLECYSTECTOMY     CYSTOSCOPY W/ URETERAL STENT PLACEMENT Right 07/23/2021   Procedure: CYSTOSCOPY WITH RETROGRADE PYELOGRAM/URETERAL STENT PLACEMENT;  Surgeon: Sherrilee,  Belvie CROME, MD;  Location: [facility];  Service: Urology;  Laterality: Right;   CYSTOSCOPY W/ URETERAL STENT PLACEMENT Right 08/13/2021   Procedure: CYSTOSCOPY WITH RETROGRADE PYELOGRAM/URETERAL STENT EXCHANGE;  Surgeon: Sherrilee Belvie CROME, MD;  Location: [facility];  Service: Urology;  Laterality: Right;   EYE MUSCLE SURGERY     FOOT SURGERY     HOLMIUM LASER APPLICATION Right 08/13/2021   Procedure: HOLMIUM LASER APPLICATION;  Surgeon:  Sherrilee Belvie CROME, MD;  Location: [facility];  Service: Urology;  Laterality: Right;   ORIF ANKLE FRACTURE Right 06/24/2020   Procedure: OPEN REDUCTION INTERNAL FIXATION (ORIF) ANKLE FRACTURE;  Surgeon: Margrette Taft BRAVO, MD;  Location: [facility];  Service: Orthopedics;  Laterality: Right;   TOOTH EXTRACTION N/A 10/08/2022   Procedure: DENTAL RESTORATION/EXTRACTIONS;  Surgeon: Sheryle Hamilton, DMD;  Location: [facility];  Service: Oral Surgery;  Laterality: N/A;    Family History  Problem Relation Age of Onset   Osteoporosis Mother    Fibromyalgia Mother    Hypertension Father    Diabetes Maternal Grandmother    Crohn's disease Maternal Grandmother    Diabetes Maternal Grandfather    COPD Maternal Grandfather    Diabetes Paternal Grandmother     Social History   Socioeconomic History   Marital status: Married    Spouse name: Not on file   Number of children: Not on file   Years of education: Not on file   Highest education level: Associate degree: academic program  Occupational History   Not on file  Tobacco Use   Smoking status: Former    Current packs/day: 0.00    Types: Cigarettes    Quit date: 05/05/2019    Years since quitting: 5.0    Passive exposure: Current   Smokeless tobacco: Never  Vaping Use   Vaping status: Every Day   Substances: Nicotine, Flavoring  Substance and Sexual Activity   Alcohol use: Never   Drug use: Not Currently    Frequency: 7.0 times per week    Types: Marijuana    Comment: Last smoked June 2023   Sexual activity: Yes    Birth control/protection: None  Other Topics Concern   Not on file  Social History Narrative   Not on file   Social Drivers of Health   Financial Resource Strain: Low Risk  (05/04/2024)   Overall Financial Resource Strain (CARDIA)    Difficulty of Paying Living Expenses: Not very hard  Food Insecurity: No Food Insecurity (05/04/2024)   Hunger Vital Sign    Worried About Running Out of Food in the Last Year: Never true     Ran Out of Food in the Last Year: Never true  Transportation Needs: No Transportation Needs (05/04/2024)   PRAPARE - Administrator, Civil Service (Medical): No    Lack of Transportation (Non-Medical): No  Physical Activity: Inactive (05/04/2024)   Exercise Vital Sign    Days of Exercise per Week: 0 days    Minutes of Exercise per Session: Not on file  Stress: No Stress Concern Present (05/04/2024)   [employer] of Occupational Health - Occupational Stress Questionnaire    Feeling of Stress: Only a little  Social Connections: Socially Integrated (05/04/2024)   Social Connection and Isolation Panel    Frequency of Communication with Friends and Family: More than three times a week    Frequency of Social Gatherings with Friends and Family: More than three times a week    Attends Religious Services: More than 4  times per year    Active Member of Clubs or Organizations: Yes    Attends Banker Meetings: More than 4 times per year    Marital Status: Married  Catering Manager Violence: Not At Risk (01/01/2022)   Humiliation, Afraid, Rape, and Kick questionnaire    Fear of Current or Ex-Partner: No    Emotionally Abused: No    Physically Abused: No    Sexually Abused: No    ROS    Objective:    BP 130/84   Pulse 90   Temp 98.1 F (36.7 C)   Ht 5' 4 (1.626 m)   Wt (!) 313 lb 12.8 oz (142.3 kg)   SpO2 99%   BMI 53.86 kg/m   Physical Exam Vitals reviewed.  Constitutional:      Appearance: Normal appearance. She is obese.  HENT:     Head: Normocephalic and atraumatic.  Eyes:     Extraocular Movements: Extraocular movements intact.     Conjunctiva/sclera: Conjunctivae normal.     Pupils: Pupils are equal, round, and reactive to light.  Cardiovascular:     Rate and Rhythm: Normal rate and regular rhythm.     Pulses: Normal pulses.     Heart sounds: Normal heart sounds. No murmur heard. Pulmonary:     Effort: Pulmonary effort is normal. No  respiratory distress.     Breath sounds: Normal breath sounds.  Abdominal:     Palpations: Abdomen is soft. There is no mass.  Musculoskeletal:        General: No deformity. Normal range of motion.     Cervical back: Normal range of motion and neck supple.  Skin:    General: Skin is warm and dry.  Neurological:     General: No focal deficit present.     Mental Status: She is alert and oriented to person, place, and time.  Psychiatric:        Mood and Affect: Mood normal.        Behavior: Behavior normal.          Assessment & Plan:   Problem List Items Addressed This Visit   None Visit Diagnoses       BMI 50.0-59.9, adult (HCC)    -  Primary   Relevant Orders   Amb Ref to Medical Weight Management   Bayer DCA Hb A1c Waived     Other fatigue       Relevant Orders   TSH   T4, Free   Vitamin D, 25-hydroxy     History of iron deficiency       Relevant Orders   CBC with Differential   Comprehensive metabolic panel with GFR   Iron, TIBC and Ferritin Panel     Screening for endocrine, nutritional, metabolic and immunity disorder         Encounter for screening for cardiovascular disorders       Relevant Orders   Lipid Panel     Encounter for immunization       Relevant Orders   Flu vaccine trivalent PF, 6mos and older(Flulaval,Afluria,Fluarix,Fluzone) (Completed)       Assessment and Plan    Obesity - Referred to weight management program - Discussed healthy diet options and recommended daily physical activity levels - Thyroid labs ordered   Fatigue - lab work ordered, results pending   Routine screening labs - Results pending        Return in about 3 months (around 08/02/2024) for annual exam.  Oneil LELON Severin, FNP [facility]

## 2024-05-04 NOTE — Telephone Encounter (Signed)
 Patient filled out record release form.   Form was faxed to  [facility] [phone])  and   [facility] [place] [phone])

## 2024-05-05 LAB — LIPID PANEL
Chol/HDL Ratio: 2.9 ratio (ref 0.0–4.4)
Cholesterol, Total: 109 mg/dL (ref 100–199)
HDL: 38 mg/dL — ABNORMAL LOW (ref >39)
LDL Chol Calc (NIH): 48 mg/dL (ref 0–99)
Triglycerides: 131 mg/dL (ref 0–149)
VLDL Cholesterol Cal: 23 mg/dL (ref 5–40)

## 2024-05-05 LAB — CBC WITH DIFFERENTIAL/PLATELET
Basophils Absolute: 0.1 x10E3/uL (ref 0.0–0.2)
Basos: 1 %
EOS (ABSOLUTE): 0 x10E3/uL (ref 0.0–0.4)
Eos: 0 %
Hematocrit: 40.6 % (ref 34.0–46.6)
Hemoglobin: 13.2 g/dL (ref 11.1–15.9)
Immature Grans (Abs): 0 x10E3/uL (ref 0.0–0.1)
Immature Granulocytes: 0 %
Lymphocytes Absolute: 2.7 x10E3/uL (ref 0.7–3.1)
Lymphs: 35 %
MCH: 29.1 pg (ref 26.6–33.0)
MCHC: 32.5 g/dL (ref 31.5–35.7)
MCV: 89 fL (ref 79–97)
Monocytes Absolute: 0.5 x10E3/uL (ref 0.1–0.9)
Monocytes: 7 %
Neutrophils Absolute: 4.3 x10E3/uL (ref 1.4–7.0)
Neutrophils: 56 %
Platelets: 362 x10E3/uL (ref 150–450)
RBC: 4.54 x10E6/uL (ref 3.77–5.28)
RDW: 13 % (ref 11.7–15.4)
WBC: 7.6 x10E3/uL (ref 3.4–10.8)

## 2024-05-05 LAB — COMPREHENSIVE METABOLIC PANEL WITH GFR
ALT: 23 IU/L (ref 0–32)
AST: 14 IU/L (ref 0–40)
Albumin: 4.1 g/dL (ref 4.0–5.0)
Alkaline Phosphatase: 79 IU/L (ref 41–116)
BUN/Creatinine Ratio: 13 (ref 9–23)
BUN: 10 mg/dL (ref 6–20)
Bilirubin Total: 0.5 mg/dL (ref 0.0–1.2)
CO2: 23 mmol/L (ref 20–29)
Calcium: 9.3 mg/dL (ref 8.7–10.2)
Chloride: 101 mmol/L (ref 96–106)
Creatinine, Ser: 0.76 mg/dL (ref 0.57–1.00)
Globulin, Total: 2.4 g/dL (ref 1.5–4.5)
Glucose: 91 mg/dL (ref 70–99)
Potassium: 4.4 mmol/L (ref 3.5–5.2)
Sodium: 139 mmol/L (ref 134–144)
Total Protein: 6.5 g/dL (ref 6.0–8.5)
eGFR: 111 mL/min/1.73 (ref >59)

## 2024-05-05 LAB — T4, FREE: Free T4: 1.07 ng/dL (ref 0.82–1.77)

## 2024-05-05 LAB — IRON,TIBC AND FERRITIN PANEL
Ferritin: 51 ng/mL (ref 15–150)
Iron Saturation: 26 % (ref 15–55)
Iron: 88 ug/dL (ref 27–159)
Total Iron Binding Capacity: 342 ug/dL (ref 250–450)
UIBC: 254 ug/dL (ref 131–425)

## 2024-05-05 LAB — VITAMIN D 25 HYDROXY (VIT D DEFICIENCY, FRACTURES): Vit D, 25-Hydroxy: 25.3 ng/mL — ABNORMAL LOW (ref 30.0–100.0)

## 2024-05-05 LAB — TSH: TSH: 2.16 u[IU]/mL (ref 0.450–4.500)

## 2024-05-08 ENCOUNTER — Ambulatory Visit: Payer: Self-pay | Admitting: Family Medicine

## 2024-06-06 ENCOUNTER — Encounter: Admitting: Family Medicine

## 2024-06-12 DIAGNOSIS — Z0289 Encounter for other administrative examinations: Secondary | ICD-10-CM

## 2024-06-13 ENCOUNTER — Ambulatory Visit (INDEPENDENT_AMBULATORY_CARE_PROVIDER_SITE_OTHER): Admitting: Nurse Practitioner

## 2024-06-13 ENCOUNTER — Encounter (INDEPENDENT_AMBULATORY_CARE_PROVIDER_SITE_OTHER): Payer: Self-pay | Admitting: Nurse Practitioner

## 2024-06-13 VITALS — BP 119/77 | HR 77 | Temp 98.8°F | Ht 64.0 in | Wt 314.0 lb

## 2024-06-13 DIAGNOSIS — E786 Lipoprotein deficiency: Secondary | ICD-10-CM | POA: Diagnosis not present

## 2024-06-13 DIAGNOSIS — E66813 Obesity, class 3: Secondary | ICD-10-CM | POA: Diagnosis not present

## 2024-06-13 DIAGNOSIS — Z6841 Body Mass Index (BMI) 40.0 and over, adult: Secondary | ICD-10-CM | POA: Diagnosis not present

## 2024-06-13 DIAGNOSIS — E559 Vitamin D deficiency, unspecified: Secondary | ICD-10-CM

## 2024-06-13 NOTE — Progress Notes (Signed)
 " [address] [place], [place], [place] [address] Office: [phone]  /  Fax: [phone]   Initial Consultation    Stefanie Farmer was seen in clinic today to evaluate for obesity. She is interested in losing weight to improve overall health and reduce the risk of weight related complications. She presents today to review program treatment options, initial physical assessment, and evaluation.    Anthropometrics and Bioimpedance Analysis   Body mass index is 53.9 kg/m. Body Fat Mass : 57 % Visceral Fat Mass Rating : 20   Obesity Related Diseases and Complications  Obesity Quality of Life and Psychosocial Complications: Reduced health-related quality of life and Decrease physical activity and social participation  Cardiometabolic: DOE and Fatigue  Biomechanical: None   Weight Related History  She was referred by: PCP  When asked what they would like to accomplish? She states: Adopt a healthier eating pattern and lifestyle, Improve energy levels and physical activity, Improve existing medical conditions, Improve quality of life, Improve appearance, Improve self-confidence, and Lose 115 lbs  Weight history: at age 29 she started to eat more when her parents separated and gained throughout high school and adulthood. She did get back to prepregnancy went and then gradually gained since then. She has a little girl who will be 2 07/01/24  Highest weight: 330  Contributing factors: family history of obesity, consumption of processed foods, moderate to high levels of stress, reduced physical activity, chronic skipping of meals, need for convenience due to lack of time, multiple weight loss attempts in the past, hectic pace of life, and need for convenient foods  Prior weight loss attempts: Ketogenic, Tracking and Journaling, and Meal Replacements  Current or previous pharmacotherapy: None and Is interested in pharmacotherapy  Response to medication: Never tried medications  Current  nutrition plan: None  Greatest challenge with dieting: no weight loss, dieting fatigue, and meal preparation and cooking.  Current level of physical activity: None  Barriers to Exercise: time and orthopedic problems  Readiness and Motivation  On a scale from 0 to 10 How ready are you to make changes to your eating and physical activity to lose weight? 10 How important is it for you to lose weight right now ? 8 How confident are you that you can lose weight if you try? 8  Past Medical History   Past Medical History:  Diagnosis Date   History of kidney stones    Hypertension    Kidney stone      Objective    BP 119/77   Pulse 77   Temp 98.8 F (37.1 C)   Ht 5' 4 (1.626 m)   Wt (!) 314 lb (142.4 kg)   LMP 06/13/2024   SpO2 99%   BMI 53.90 kg/m  She was weighed on the bioimpedance scale: Body mass index is 53.9 kg/m.    General:  Alert, oriented and cooperative. Patient is in no acute distress.  Respiratory: Normal respiratory effort, no problems with respiration noted   Gait: able to ambulate independently  Mental Status: Normal mood and affect. Normal behavior. Normal judgment and thought content.   Diagnostic Data Reviewed  BMET    Component Value Date/Time   NA 139 05/04/2024 1433   K 4.4 05/04/2024 1433   CL 101 05/04/2024 1433   CO2 23 05/04/2024 1433   GLUCOSE 91 05/04/2024 1433   GLUCOSE 94 10/08/2022 0751   BUN 10 05/04/2024 1433   CREATININE 0.76 05/04/2024 1433   CALCIUM 9.3 05/04/2024 1433  GFRNONAA >60 10/08/2022 0751   Lab Results  Component Value Date   HGBA1C 4.9 05/04/2024   HGBA1C 5.1 01/01/2022   No results found for: INSULIN CBC    Component Value Date/Time   WBC 7.6 05/04/2024 1433   WBC 9.7 10/08/2022 0751   RBC 4.54 05/04/2024 1433   RBC 4.35 10/08/2022 0751   HGB 13.2 05/04/2024 1433   HCT 40.6 05/04/2024 1433   PLT 362 05/04/2024 1433   MCV 89 05/04/2024 1433   MCH 29.1 05/04/2024 1433   MCH 26.9 10/08/2022 0751    MCHC 32.5 05/04/2024 1433   MCHC 31.4 10/08/2022 0751   RDW 13.0 05/04/2024 1433   Iron/TIBC/Ferritin/ %Sat    Component Value Date/Time   IRON 88 05/04/2024 1433   TIBC 342 05/04/2024 1433   FERRITIN 51 05/04/2024 1433   IRONPCTSAT 26 05/04/2024 1433   Lipid Panel     Component Value Date/Time   CHOL 109 05/04/2024 1433   TRIG 131 05/04/2024 1433   HDL 38 (L) 05/04/2024 1433   CHOLHDL 2.9 05/04/2024 1433   LDLCALC 48 05/04/2024 1433   Hepatic Function Panel     Component Value Date/Time   PROT 6.5 05/04/2024 1433   ALBUMIN 4.1 05/04/2024 1433   AST 14 05/04/2024 1433   ALT 23 05/04/2024 1433   ALKPHOS 79 05/04/2024 1433   BILITOT 0.5 05/04/2024 1433      Component Value Date/Time   TSH 2.160 05/04/2024 1433    Medications  No outpatient encounter medications on file as of 06/13/2024.   No facility-administered encounter medications on file as of 06/13/2024.     Assessment and Plan   Low HDL (under 40) Focus on limiting saturated fats. Increase good oils, nuts and berries Loss of 10-15% body weight can improve lipid levels  Vitamin D  deficiency Low vitamin D  levels can be associated with adiposity and may result in leptin resistance and weight gain. Also associated with fatigue.  Will check level at first visit.     Class 3 severe obesity without serious comorbidity with body mass index (BMI) of 50.0 to 59.9 in adult, unspecified obesity type (HCC) Obesity Treatment and Action Plan:  Patient will work on garnering support from family and friends to begin weight loss journey. Will work on eliminating or reducing the presence of highly palatable, calorie dense foods in the home. Will complete provided nutritional and psychosocial assessment questionnaire before the next appointment. Will be scheduled for indirect calorimetry to determine resting energy expenditure in a fasting state.  This will allow us  to create a reduced calorie, high-protein meal plan to  promote loss of fat mass while preserving muscle mass. Counseled on the health benefits of losing 5%-15% of total body weight. Was counseled on nutritional approaches to weight loss and benefits of reducing processed foods and consuming plant-based foods and high quality protein as part of nutritional weight management. Was counseled on pharmacotherapy and role as an adjunct in weight management.   Education and Additional resources  She was weighed on the bioimpedance scale and results were discussed and documented in the synopsis.  We discussed obesity as a progressive, chronic disease and the importance of a more detailed evaluation of all the factors contributing to the disease.  We reviewed the basic principles in obesity management.   We discussed the importance of long term lifestyle changes which include nutrition, exercise and behavioral modification as well as the importance of customizing this to her specific health and social  needs.  We reviewed the role of medical interventions including pharmacotherapy and surgical interventions.   We discussed the benefits of reaching a healthier weight to alleviate the symptoms of existing conditions and reduce the risks of the biomechanical, cardiometabolic and psychological effects of obesity.  We reviewed our program approach and philosophy, which are guided by the four pillars of obesity medicine.  We discussed how to prepare for intake appointment and the importance of fasting and avoidance of stimulants for at least 8 hours prior to indirect calorimetry.  Stefanie Farmer appears to be in the action stage of change and reports being ready to initiate intensive lifestyle and behavioral modifications as part of their weight loss journey.  Attestation  Reviewed by clinician on day of visit: allergies, medications, problem list, medical history, surgical history, family history, social history, and previous encounter notes pertinent to  obesity diagnosis.  I personally spent a total of 30 minutes in the care of the patient today including preparing to see the patient, getting/reviewing separately obtained history, performing a medically appropriate exam/evaluation, counseling and educating, and documenting clinical information in the EHR.   Lonell Liverpool ANP-C "

## 2024-06-14 ENCOUNTER — Telehealth (INDEPENDENT_AMBULATORY_CARE_PROVIDER_SITE_OTHER): Payer: Self-pay | Admitting: Nurse Practitioner

## 2024-06-14 NOTE — Telephone Encounter (Signed)
 Confirmed new pt packet and information.Pt agreed and understood.

## 2024-07-03 ENCOUNTER — Encounter (INDEPENDENT_AMBULATORY_CARE_PROVIDER_SITE_OTHER): Payer: Self-pay | Admitting: Nurse Practitioner

## 2024-07-03 ENCOUNTER — Ambulatory Visit (INDEPENDENT_AMBULATORY_CARE_PROVIDER_SITE_OTHER): Admitting: Nurse Practitioner

## 2024-07-03 VITALS — BP 114/80 | HR 99 | Temp 98.8°F | Ht 64.0 in | Wt 315.0 lb

## 2024-07-03 DIAGNOSIS — Z1331 Encounter for screening for depression: Secondary | ICD-10-CM

## 2024-07-03 DIAGNOSIS — Z6841 Body Mass Index (BMI) 40.0 and over, adult: Secondary | ICD-10-CM

## 2024-07-03 DIAGNOSIS — E786 Lipoprotein deficiency: Secondary | ICD-10-CM

## 2024-07-03 DIAGNOSIS — E66813 Obesity, class 3: Secondary | ICD-10-CM

## 2024-07-03 DIAGNOSIS — R0602 Shortness of breath: Secondary | ICD-10-CM

## 2024-07-03 DIAGNOSIS — E559 Vitamin D deficiency, unspecified: Secondary | ICD-10-CM | POA: Diagnosis not present

## 2024-07-03 DIAGNOSIS — R5383 Other fatigue: Secondary | ICD-10-CM

## 2024-07-04 ENCOUNTER — Ambulatory Visit (INDEPENDENT_AMBULATORY_CARE_PROVIDER_SITE_OTHER): Payer: Self-pay | Admitting: Nurse Practitioner

## 2024-07-04 LAB — BASIC METABOLIC PANEL WITH GFR
BUN/Creatinine Ratio: 11 (ref 9–23)
BUN: 8 mg/dL (ref 6–20)
CO2: 19 mmol/L — ABNORMAL LOW (ref 20–29)
Calcium: 9 mg/dL (ref 8.7–10.2)
Chloride: 103 mmol/L (ref 96–106)
Creatinine, Ser: 0.73 mg/dL (ref 0.57–1.00)
Glucose: 88 mg/dL (ref 70–99)
Potassium: 4.5 mmol/L (ref 3.5–5.2)
Sodium: 137 mmol/L (ref 134–144)
eGFR: 116 mL/min/{1.73_m2}

## 2024-07-04 LAB — VITAMIN B12: Vitamin B-12: 462 pg/mL (ref 232–1245)

## 2024-07-04 LAB — MAGNESIUM: Magnesium: 1.9 mg/dL (ref 1.6–2.3)

## 2024-07-04 LAB — INSULIN, RANDOM: INSULIN: 26.3 u[IU]/mL — ABNORMAL HIGH (ref 2.6–24.9)

## 2024-07-19 ENCOUNTER — Ambulatory Visit (INDEPENDENT_AMBULATORY_CARE_PROVIDER_SITE_OTHER): Admitting: Nurse Practitioner

## 2024-08-02 ENCOUNTER — Ambulatory Visit (INDEPENDENT_AMBULATORY_CARE_PROVIDER_SITE_OTHER): Admitting: Nurse Practitioner
